# Patient Record
Sex: Male | Born: 1960 | Race: White | Hispanic: No
Health system: Western US, Academic
[De-identification: ages and names within clinical notes are randomized; demographics above are authoritative.]

## PROBLEM LIST (undated history)

## (undated) DIAGNOSIS — I10 Essential (primary) hypertension: Secondary | ICD-10-CM

## (undated) DIAGNOSIS — M47819 Spondylosis without myelopathy or radiculopathy, site unspecified: Secondary | ICD-10-CM

## (undated) DIAGNOSIS — K219 Gastro-esophageal reflux disease without esophagitis: Secondary | ICD-10-CM

## (undated) DIAGNOSIS — Z87442 Personal history of urinary calculi: Secondary | ICD-10-CM

## (undated) DIAGNOSIS — E7800 Pure hypercholesterolemia, unspecified: Secondary | ICD-10-CM

## (undated) DIAGNOSIS — E785 Hyperlipidemia, unspecified: Secondary | ICD-10-CM

## (undated) DIAGNOSIS — J309 Allergic rhinitis, unspecified: Secondary | ICD-10-CM

## (undated) HISTORY — DX: Allergic rhinitis, unspecified: J30.9

## (undated) HISTORY — DX: Hyperlipidemia, unspecified: E78.5

## (undated) HISTORY — DX: Gastro-esophageal reflux disease without esophagitis: K21.9

## (undated) HISTORY — DX: Pure hypercholesterolemia, unspecified: E78.00

## (undated) HISTORY — PX: NECK SURGERY: SHX720

## (undated) HISTORY — PX: BICEPS TENDON REPAIR: SHX566

## (undated) HISTORY — PX: CARDIAC CATHETERIZATION: SHX172

## (undated) HISTORY — PX: UPPER GASTROINTESTINAL ENDOSCOPY: SHX188

## (undated) DEATH — deceased

---

## 2005-02-04 ENCOUNTER — Emergency Department (HOSPITAL_COMMUNITY): Admission: EM | Admit: 2005-02-04 | Discharge: 2005-02-04 | Payer: Self-pay | Admitting: Emergency Medicine

## 2007-05-22 ENCOUNTER — Emergency Department (HOSPITAL_COMMUNITY): Admission: EM | Admit: 2007-05-22 | Discharge: 2007-05-22 | Payer: Self-pay | Admitting: Emergency Medicine

## 2009-11-24 ENCOUNTER — Ambulatory Visit (INDEPENDENT_AMBULATORY_CARE_PROVIDER_SITE_OTHER): Payer: BLUE CROSS/BLUE SHIELD | Admitting: Internal Medicine

## 2009-11-24 ENCOUNTER — Encounter (INDEPENDENT_AMBULATORY_CARE_PROVIDER_SITE_OTHER): Payer: Self-pay | Admitting: Internal Medicine

## 2009-11-24 VITALS — BP 146/82 | HR 76 | Temp 97.4°F | Resp 20 | Wt 217.0 lb

## 2009-11-24 LAB — THYROID STIMULATING HORMONE: Thyroid Stimulating Hormone: 2.618 u[IU]/mL (ref 0.400–5.000)

## 2009-11-24 MED ORDER — SERTRALINE HCL 50 MG OR TABS
ORAL_TABLET | ORAL | Status: DC
Start: 2009-11-24 — End: 2010-02-23

## 2009-11-24 NOTE — Progress Notes (Signed)
New pt here for medication review.

## 2009-11-24 NOTE — Progress Notes (Signed)
CHIEF COMPLAINT:  [name] is a 49 year old male here to discuss Medication management  .    HISTORY OF PRESENT ILLNESS OR INTERVAL HISTORY:  Pt with a family doctor in [place].  He will be switching to care here.  Four weeks ago, he was seen as a medication f/u for cholesterol.      He has a history of depression, high cholesterol, and hypertension for several years.  He was on fluoxetine, statin.  He has headaches from statins.  He did not tolerate niacin.  He has a history of [employer].  Four to five months ago, he developed increase sensation of heat and sweating.  He has a history of blood sugar dropping and he gets sensations of shaking and jittery feeling.  He feels disconnected and weak.  He is having particular problems in the mornings, but is starting to have them more frequently and he feels this way during the day.  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  Last week, he turned his head quickly and he had a sensation of vertigo.      He had a cramping sensation in the center of his chest last week than radiated to his back and up into his jaw.  This lasted a few minutes and went away.  He has had similar sensation while sleeping, but doesn't wake him up.  This has happened about 3-4 times.      He started taking fluoxetine when he moved back to [facility] area.  He moved to [place] in March/April.      Review of Systems   Constitutional: Positive for diaphoresis, activity change and fatigue.   Respiratory: Positive for chest tightness.    Cardiovascular: Positive for chest pain and palpitations.   Gastrointestinal: Positive for nausea.   Musculoskeletal: Positive for arthralgias.   Neurological: Positive for dizziness and headaches.   Psychiatric/Behavioral: Positive for sleep disturbance. Negative for suicidal ideas and self-injury. The patient is nervous/anxious.        PROBLEM LIST:  Reviewed and updated in the electronic medical record under Problem List.    This  includes pertinent past medical and surgical history.      MEDICATIONS:  Reviewed and updated in the electronic medical record under Medications.      ALLERGIES:  Reviewed and updated in the electronic medical record under Allergies.      SOCIAL HISTORY:  Pt with a history of working for [employer].  He had a high stress job.  He is from [place], moved there in 2006, then moved to [place] in a rental house.  He started taking fluoxetine at the time.      FAMILY HISTORY:  Reviewed and updated in the electronic medical record under Family History.    Physical Exam   Constitutional: He is oriented to person, place, and time. He appears well-developed and well-nourished. He appears distressed.   HENT:   Head: Normocephalic and atraumatic.   Neck: Normal range of motion. Neck supple. No JVD present. No tracheal deviation present. No thyromegaly present.   Cardiovascular: Normal rate, regular rhythm, normal heart sounds and intact distal pulses.  Exam reveals no gallop and no friction rub.    No murmur heard.  Pulmonary/Chest: Effort normal and breath sounds normal. No stridor. No respiratory distress. He has no wheezes. He has no rales. He exhibits no tenderness.   Abdominal: Soft. Bowel sounds are normal. He exhibits no  distension and no mass. No tenderness. He has no rebound and no guarding.   Musculoskeletal: Normal range of motion. He exhibits no edema and no tenderness.   Lymphadenopathy:     He has no cervical adenopathy.   Neurological: He is alert and oriented to person, place, and time. He has normal reflexes.   Skin: No rash noted. He is not diaphoretic. No erythema. No pallor.   Psychiatric: He has a normal mood and affect. His behavior is normal. Judgment and thought content normal.       DIAGNOSTICS:  ECG with normal rhythm, normal intervals, slight delay in R wave progression, but otherwise normal in appearance.        PHQ9 17  GAD7 14    ASSESSMENT AND PLAN:   CHEST PAIN NOS  (primary encounter  diagnosis)  Comment: ECG reassuring.  I am concerned given his mother's history of sudden death (usually cardiac).  I think I may want to start with stress test (stress echo).  If normal, then unlikely to be cardiac chest pain.  Echo would be helpful in assessing the aorta as well.  His history of pain radiating up to throat is concerning for aorta pathology.    Plan: EKG, start ASA.       OTHER MALAISE AND FATIGUE  Comment: I am concerned that his symptoms are most consistent with anxiety.  I have started sertraline and will uptitrate and have him start fluoxetine.  He will need to be on a more aggressive treatment.  Thyroid ran to r/o organic cause.    Plan: Sertraline HCl 50 MG Oral Tab, THYROID         STIMULATING HORMONE          HTN:  Needs better control, but will re-address as he seemed rather nervous.      Anxiety and depression:   See above.  Likely a good MHIP candidate.      2 weeks.

## 2009-11-29 ENCOUNTER — Encounter (INDEPENDENT_AMBULATORY_CARE_PROVIDER_SITE_OTHER): Payer: Self-pay | Admitting: Internal Medicine

## 2009-12-14 ENCOUNTER — Ambulatory Visit (INDEPENDENT_AMBULATORY_CARE_PROVIDER_SITE_OTHER): Payer: BLUE CROSS/BLUE SHIELD | Admitting: Internal Medicine

## 2009-12-14 ENCOUNTER — Encounter (INDEPENDENT_AMBULATORY_CARE_PROVIDER_SITE_OTHER): Payer: Self-pay | Admitting: Internal Medicine

## 2009-12-14 VITALS — BP 110/66 | HR 80 | Temp 97.6°F | Resp 20 | Wt 214.0 lb

## 2009-12-14 MED ORDER — VENLAFAXINE HCL ER 37.5 MG OR TB24
EXTENDED_RELEASE_TABLET | ORAL | Status: DC
Start: 2009-12-14 — End: 2010-01-11

## 2009-12-14 NOTE — Progress Notes (Signed)
CHIEF COMPLAINT:  Dustin Weaver is a 49 year old male here to discuss chest pain.    HISTORY OF PRESENT ILLNESS OR INTERVAL HISTORY:  Pt started sertraline and has been doing well.  He gets sore muscles in the legs and increased fatigue.  The episodes of dizziness, etc have decreased to almost nothing.  This happened about a week after the last visit.  He is unclear if any of these episodes were panic related.  He does not have any signs or symptoms of w/d with the change of SSRIs.  He would like to change the fact that he is fatigued with the medication.  He continues to have problem sleeping at night.      Chest pain has been periodic for years.  He did a stress treadmill years ago and had a normal exam as he had chest pain back then.  The pain is not necessarily associated with food or with activity.  He will have back pain extending into his neck.  His last ECG was normal.  His mother did have sudden death in her 31s, unclear etiology.  He has increased lipids.      Review of Systems   Constitutional: Positive for fatigue.   Cardiovascular: Positive for chest pain.   Musculoskeletal: Positive for back pain.     PROBLEM LIST:  Reviewed and updated in the electronic medical record under Problem List.    This includes pertinent past medical and surgical history.      MEDICATIONS:  Reviewed and updated in the electronic medical record under Medications.      ALLERGIES:  Reviewed and updated in the electronic medical record under Allergies.      SOCIAL HISTORY:  Reviewed and updated in the electronic medical record under Social History.    FAMILY HISTORY:  Reviewed and updated in the electronic medical record under Family History.      Physical Exam   Constitutional: He is oriented to person, place, and time. He appears well-developed and well-nourished. No distress.   HENT:   Head: Normocephalic and atraumatic.   Eyes: Conjunctivae are normal. Pupils are equal, round, and reactive to light. No scleral icterus.      Cardiovascular: Normal rate, regular rhythm and intact distal pulses.  Exam reveals no gallop and no friction rub.    No murmur heard.  Pulmonary/Chest: Effort normal and breath sounds normal. No respiratory distress. He has no wheezes. He has no rales.   Musculoskeletal: Normal range of motion.   Neurological: He is alert and oriented to person, place, and time.   Skin: Skin is warm and dry. No rash noted. He is not diaphoretic. No erythema. No pallor.   Psychiatric: His behavior is normal. Judgment and thought content normal.        Slightly anxious.  Less so this visit.       ECG reviewed and normal (done last visit)    ASSESSMENT AND PLAN:   CHEST PAIN NOS  (primary encounter diagnosis)  Pt with concerning chest pain and risk factors for cardiac disease.  I am concerned that his mother's history of sudden death can be indicative of increased risk of coronary disease.  Additionally, I am worried about the specific symptoms being related to an aortic problem.   -  I have ordered echo to evaluate the aorta as well as the structure of the heart to r/o HOCM or other causes of sudden death.    -  Treadmill stress test ordered  for evaluation of ischemia.                 GENERALIZED ANXIETY DIS  Since pt having fatigue with SSRI, I have started venlafaxine, which may also help with his chronic back pain.  He will uptitrate over the next several weeks while decreasing sertraline.  See patient instructions.       DEPRESSIVE DISORDER RECURRENT,NOS  See above.      F/u in 4 weeks.

## 2009-12-14 NOTE — Patient Instructions (Signed)
Start venlafaxine 1 tab daily, decrease sertraline to 2 tabs daily x 1 week.    Increase venlafaxine to 2 tabs daily and decrease sertraline to 1 tab daily x 1 week,  Increase venlafaxine to 3 tabs daily and stop sertraline.      Echo to evaluate the structure of the heart and aorta.      Stress test to evaluate chest pain.

## 2010-01-06 ENCOUNTER — Telehealth (INDEPENDENT_AMBULATORY_CARE_PROVIDER_SITE_OTHER): Payer: Self-pay | Admitting: Internal Medicine

## 2010-01-11 ENCOUNTER — Ambulatory Visit (INDEPENDENT_AMBULATORY_CARE_PROVIDER_SITE_OTHER): Payer: BLUE CROSS/BLUE SHIELD | Admitting: Internal Medicine

## 2010-01-11 VITALS — BP 130/82 | HR 60 | Temp 98.8°F | Resp 20 | Wt 214.0 lb

## 2010-01-11 MED ORDER — LISINOPRIL 10 MG OR TABS
ORAL_TABLET | ORAL | Status: DC
Start: 2010-01-11 — End: 2010-02-23

## 2010-01-11 MED ORDER — ZOLPIDEM TARTRATE 10 MG OR TABS
ORAL_TABLET | ORAL | Status: DC
Start: 2010-01-11 — End: 2010-02-23

## 2010-01-11 NOTE — Progress Notes (Signed)
Pt here for 4 week follow up on anxiety/depression.

## 2010-01-11 NOTE — Progress Notes (Signed)
CHIEF COMPLAINT:  Dustin Weaver is a 49 year old male here to discuss Follow-Up   .    HISTORY OF PRESENT ILLNESS OR INTERVAL HISTORY:  Pt went for heart echo.  He was told that this was normal.  He would like to hold on any further diagnostic testing at this time because he thinks it is anxiety related chest symptoms and they have resolved.      He continues to have headache, jittery feeling.  He has noted feeling poorly since starting fluoxetine.  He continues to feel tired and sleepy.  He does not sleep well at night as he is a light sleeper.  He has tried sleep hygeine and natural methods (like herbals) without help.  He does not want to feel groggy in the morning.      He did not fill the venlafaxine because of cost.  He does think the sertraline is causing fatigue.  He is currently taking it in the morning and thinks it may be causing the drowsiness.      Review of Systems   Constitutional: Positive for fatigue.   Cardiovascular: Negative.    Skin: Negative.    Neurological: Negative.    Psychiatric/Behavioral: Negative for self-injury. The patient is nervous/anxious.      PROBLEM LIST:  Reviewed and updated in the electronic medical record under Problem List.    This includes pertinent past medical and surgical history.    Patient Active Problem List   Diagnoses Date Noted   . GENERALIZED ANXIETY DIS 11/29/2009     Priority: Medium   . DEPRESSIVE DISORDER RECURRENT,NOS 11/29/2009     Priority: Medium     Suicidal thoughts in the past, started fluoxetine     . BENIGN HYPERTENSION 11/29/2009     Priority: Medium     Normal echo 01/2010     . PURE HYPERCHOLESTEROLEM 11/29/2009     Priority: Medium     Did not tolerate statin           MEDICATIONS:  Reviewed and updated in the electronic medical record under Medications.      ALLERGIES:  Reviewed and updated in the electronic medical record under Allergies.      SOCIAL HISTORY:  Reviewed and updated in the electronic medical record under Social History.    FAMILY  HISTORY:  Reviewed and updated in the electronic medical record under Family History.      Physical Exam   Constitutional: He is oriented to person, place, and time. He appears well-developed and well-nourished.   HENT:   Head: Normocephalic and atraumatic.   Eyes: Pupils are equal, round, and reactive to light.   Cardiovascular: Normal rate and regular rhythm.  Exam reveals friction rub. Exam reveals no gallop.    No murmur heard.  Pulmonary/Chest: Effort normal and breath sounds normal. No respiratory distress. He has no wheezes. He has no rales.   Neurological: He is alert and oriented to person, place, and time.   Skin: Skin is warm and dry. No rash noted. No erythema. No pallor.   Psychiatric: His behavior is normal. Judgment and thought content normal.        Pt has an axious demeanor, but is slightly improved.         ASSESSMENT AND PLAN:   GENERALIZED ANXIETY DIS  (primary encounter diagnosis)  Comment: Pt continues to have fatigue, which may be a side effect of the medication.  He would like to stick with the same medication (  sertraline) and simply change the time of day that he takes the medication.  He will start nighttime dosing as it seems to cause some fatigue.  Additionally, he will add ambien to assist with sleep.  This will likely be helpful in keeping him from feeling fatigued during the day.  Sleep apnea as also discussed, but he would like to hold on a sleep study for now.         DEPRESSIVE DISORDER RECURRENT,NOS  Comment: see above.    Plan: Zolpidem Tartrate 10 MG Oral Tab             BENIGN HYPERTENSION  Comment: controlled today.    Plan: Lisinopril 10 MG Oral Tab

## 2010-02-23 ENCOUNTER — Ambulatory Visit (INDEPENDENT_AMBULATORY_CARE_PROVIDER_SITE_OTHER): Payer: BLUE CROSS/BLUE SHIELD | Admitting: Internal Medicine

## 2010-02-23 VITALS — BP 138/85 | HR 88 | Temp 98.2°F | Resp 20 | Wt 213.0 lb

## 2010-02-23 MED ORDER — LISINOPRIL 10 MG OR TABS
ORAL_TABLET | ORAL | Status: DC
Start: 2010-02-23 — End: 2011-04-03

## 2010-02-23 MED ORDER — SERTRALINE HCL 50 MG OR TABS
ORAL_TABLET | ORAL | Status: DC
Start: 2010-02-23 — End: 2010-11-13

## 2010-02-23 MED ORDER — ZOLPIDEM TARTRATE 10 MG OR TABS
ORAL_TABLET | ORAL | Status: DC
Start: 2010-02-23 — End: 2010-03-24

## 2010-02-23 NOTE — Patient Instructions (Signed)
Follow-up Appointment with:   Azriel Dancy, MD      OVS (Office Visit Short),  3 months for cholesterol      __Sign Release of Information      __Sign up for eCare

## 2010-02-23 NOTE — Progress Notes (Signed)
Pt here for 6 week follow up on anxiety.

## 2010-02-23 NOTE — Progress Notes (Signed)
CHIEF COMPLAINT:  Mr. Dec is a 49 year old male here to discuss Anxiety  .    HISTORY OF PRESENT ILLNESS OR INTERVAL HISTORY:  Pt is feeling better.  He thinks the meds are at the right dose.  He is taking 1/2 pill of lisinopril which is helping with his symptoms.  The sertraline is helping him to feel more relaxed.  He continues to have the low blood sugar feeling, but it does not bother him as much.  He notes that it generally coincides with increased caffeine.  He wanted to know if he will need the antidepressant for life, or if he could be off it at some point.  He is not having side effects from the meds.  He did mention that his family members either drink or are on medications for depression and anxiety.  He no longer drinks.          Review of Systems   Constitutional: Negative.    HENT: Negative.    Musculoskeletal: Negative.    Psychiatric/Behavioral: Negative.        PROBLEM LIST:  Reviewed and updated in the electronic medical record under Problem List.    This includes pertinent past medical and surgical history.    Patient Active Problem List   Diagnoses Date Noted   . GENERALIZED ANXIETY DIS 11/29/2009     Priority: Medium   . DEPRESSIVE DISORDER RECURRENT,NOS 11/29/2009     Priority: Medium     Suicidal thoughts in the past, started fluoxetine     . BENIGN HYPERTENSION 11/29/2009     Priority: Medium     Normal echo 01/2010     . PURE HYPERCHOLESTEROLEM 11/29/2009     Priority: Medium     Did not tolerate statin           MEDICATIONS:  Reviewed and updated in the electronic medical record under Medications.      ALLERGIES:  Reviewed and updated in the electronic medical record under Allergies.      SOCIAL HISTORY:  Reviewed and updated in the electronic medical record under Social History.    FAMILY HISTORY:  Reviewed and updated in the electronic medical record under Family History.      Physical Exam   Constitutional: He is oriented to person, place, and time. He appears well-developed and  well-nourished. No distress.   HENT:   Head: Normocephalic and atraumatic.   Mouth/Throat: No oropharyngeal exudate.   Eyes: Conjunctivae are normal. Pupils are equal, round, and reactive to light.   Cardiovascular: Normal rate, regular rhythm and normal heart sounds.  Exam reveals no gallop and no friction rub.    No murmur heard.  Pulmonary/Chest: Effort normal and breath sounds normal. No respiratory distress. He has no wheezes. He has no rales. He exhibits no tenderness.   Neurological: He is alert and oriented to person, place, and time.   Skin: Skin is warm and dry. No rash noted. He is not diaphoretic. No erythema. No pallor.   Psychiatric: He has a normal mood and affect. His behavior is normal. Judgment and thought content normal.         PHQ9  9  GAD7  3    ASSESSMENT AND PLAN:   DEPRESSIVE DISORDER RECURRENT,NOS  (primary encounter diagnosis)  Comment: continue sertraline at current dose for at least 6 months.    Plan: Zolpidem Tartrate 10 MG Oral Tab             BENIGN  HYPERTENSION  Comment: continue lisinopril at 5mg , call if readings are abnormal >140/90  Plan: Lisinopril 10 MG Oral Tab             OTHER MALAISE AND FATIGUE  Comment: resolved.    Plan: Sertraline HCl 50 MG Oral Tab             PURE HYPERCHOLESTEROLEM  Comment: starting omega 3 fatty acids  recheck in 3 months.    Plan: Omega-3 Fatty Acids (FISH OIL) 1000 MG Oral Cap

## 2010-03-23 ENCOUNTER — Telehealth (INDEPENDENT_AMBULATORY_CARE_PROVIDER_SITE_OTHER): Payer: Self-pay | Admitting: Internal Medicine

## 2010-03-23 NOTE — Telephone Encounter (Signed)
Patient states he needs an refill on his medication patient not sure what the name of it but, patient states it's the Generic for Ambien 10 MG.  Patient states he has ONLY three days worth left.  Please advise.

## 2010-03-24 ENCOUNTER — Telehealth (INDEPENDENT_AMBULATORY_CARE_PROVIDER_SITE_OTHER): Payer: Self-pay | Admitting: Internal Medicine

## 2010-03-24 MED ORDER — ZOLPIDEM TARTRATE 10 MG OR TABS
ORAL_TABLET | ORAL | Status: DC
Start: 2010-03-24 — End: 2010-09-25

## 2010-03-24 NOTE — Telephone Encounter (Addendum)
Error open in encounter

## 2010-03-24 NOTE — Telephone Encounter (Signed)
Received fax from [facility] pharmacy on 03/23/10. Zolpidem requires prior authorization.   Called pt insurance at [phone] for approval. The prio Berkley Harvey will be faxed today.    #[phone]

## 2010-03-25 NOTE — Telephone Encounter (Signed)
Patient calling to check on the status of this. Patient states that the pharmacy is saying that [employer] has not received the prior-authorization form. Patient called in to let us know what phone number to call, however he wrote down not enough digits. [phone]. I called [employer] [place] and got the phone number for [employer]: [phone]. Patient would like this to be taken care of asap, and would like a call back regarding the status of this. Patient has given verbal ok to leave detailed voicemail if he does not answer. Thank you.

## 2010-03-25 NOTE — Telephone Encounter (Signed)
CONFIRMED PHONE NUMBER: [phone]  CALLERS FIRST AND LAST NAME: Alyssa  FACILITY NAME: [facility]  TITLE:Pharmacy rep  CALLERS RELATIONSHIP  other    RETURN CALL: OK to leave detailed message with anyone that answers    SUBJECT: Prescription Management   REASON FOR REQUEST:The caller stated that the pt's insurance company informed the pharmacy that authorization from the prescribing provider has not yet been received    MEDICATION: Zolpidem    PRESCRIBING PROVIDER: Mindi Slicker, MD    DOSE TAKING NOW:   PHARMACY NAME, LOCATION, & PHONE #:

## 2010-03-25 NOTE — Telephone Encounter (Signed)
Called the pharmacy. They have not revceived an approval yet for the Zolpidem. The pharmacy said insurance is probably still reviewing it. I will fax the prior auth again

## 2010-03-26 ENCOUNTER — Telehealth (INDEPENDENT_AMBULATORY_CARE_PROVIDER_SITE_OTHER): Payer: Self-pay | Admitting: Internal Medicine

## 2010-03-26 NOTE — Telephone Encounter (Signed)
I called the prior auth hotline.  This should be approved by today and ready for pickup tomorrow.  Please inform patient.

## 2010-03-26 NOTE — Telephone Encounter (Signed)
Called pt and informed per Dr. Deirdre Pippins msg. Closing TE

## 2010-03-30 NOTE — Telephone Encounter (Signed)
Received fax from [facility] 03/27/10. Got an approval. Closing TE

## 2010-05-26 ENCOUNTER — Ambulatory Visit (INDEPENDENT_AMBULATORY_CARE_PROVIDER_SITE_OTHER): Payer: BLUE CROSS/BLUE SHIELD | Admitting: Internal Medicine

## 2010-05-26 VITALS — BP 128/70 | HR 83 | Temp 97.2°F | Wt 209.0 lb

## 2010-05-26 NOTE — Progress Notes (Signed)
CHIEF COMPLAINT:  Dustin Weaver is a 50 year old male here to discuss Follow-Up   .    HISTORY OF PRESENT ILLNESS OR INTERVAL HISTORY:  Pt is here to discuss 3 months Follow-Up.  He would like to discuss the sleep aids.  Ambien sometimes works and sometimes does not working.  When it works, he is able to go to sleep.  He does need a sleep aid to sleep aid.  He does drink coke when he takes the pill.      He had sinus issues a couple of weeks ago, he hd issues with allergies.  Eyes were burning and his mucous membranes were swollen.  He feels like he cannot get enough air in.  He can expand his lungs.  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  He has no history of asthma.        He is not having chest pains, but does have palpitations.  Sertraline is working well for anxiety.      Review of Systems   Respiratory: Positive for shortness of breath.    Cardiovascular: Positive for palpitations.   Psychiatric/Behavioral: Positive for sleep disturbance.         PROBLEM LIST:  Reviewed and updated in the electronic medical record under Problem List.    This includes pertinent past medical and surgical history.    Patient Active Problem List   Diagnoses Date Noted   . GENERALIZED ANXIETY DIS 11/29/2009     Priority: Medium   . DEPRESSIVE DISORDER RECURRENT,NOS 11/29/2009     Priority: Medium     Suicidal thoughts in the past, started fluoxetine     . BENIGN HYPERTENSION 11/29/2009     Priority: Medium     Normal echo 01/2010     . PURE HYPERCHOLESTEROLEM 11/29/2009     Priority: Medium     Did not tolerate statin           MEDICATIONS:  Reviewed and updated in the electronic medical record under Medications.      ALLERGIES:  Reviewed and updated in the electronic medical record under Allergies.      SOCIAL HISTORY:  Reviewed and updated in the electronic medical record under Social History.    FAMILY HISTORY:  Reviewed and updated in the electronic medical record under Family  History.      Physical Exam   Constitutional: He is oriented to person, place, and time. He appears well-developed and well-nourished. No distress.   HENT:   Head: Normocephalic and atraumatic.   Eyes: Conjunctivae are normal. Pupils are equal, round, and reactive to light.   Neck: Normal range of motion. Neck supple. No JVD present. No tracheal deviation present. No thyromegaly present.   Cardiovascular: Normal rate, regular rhythm and normal heart sounds.  Exam reveals no gallop and no friction rub.    No murmur heard.  Pulmonary/Chest: Breath sounds normal. No respiratory distress. He has no wheezes. He has no rales.   Abdominal: Soft. Bowel sounds are normal.   Lymphadenopathy:     He has no cervical adenopathy.   Neurological: He is alert and oriented to person, place, and time.   Skin: Skin is warm and dry. He is not diaphoretic.   Psychiatric: He has a normal mood and affect. His behavior is normal. Judgment and thought content normal.       ASSESSMENT AND PLAN:   SHORTNESS OF BREATH  (primary  encounter diagnosis)  Spirometry in the office as normal FEV, FEV1, both >4L and 80% predicted.  FEV/FEV1 ratio is normal.  No evidence of obstruction.  He may be having reactive airways from poor air quality with his new work position.       SCREENING FOR LIPOID DISORDERS  Comment: needs screening today  Plan: LIPID PANEL, COMPREHENSIVE METABOLIC PANEL             GENERALIZED ANXIETY DIS  Comment: Pt doing well and should continue sertraline for an additional 6 months.    Plan: CBC, DIFF             VACCINE FOR INFLUENZA  Plan: INFLUENZA VACC, NO PRESERV 3 YRS AND OLDER         0.5ML/IM             HEARING LOSS NOS  Comment: bilateral cerumen impaction, removed today  Plan: EAR WAX LAVAGE ONLY            INSOMNIA:  Avoid caffeine.

## 2010-05-26 NOTE — Patient Instructions (Signed)
Follow-up Appointment with:   Catha Gosselin, MD      OVS (Office Visit Short),  6 months for hypertension      __Sign Release of Information      __Sign up for eCare

## 2010-06-16 ENCOUNTER — Ambulatory Visit (INDEPENDENT_AMBULATORY_CARE_PROVIDER_SITE_OTHER): Payer: BLUE CROSS/BLUE SHIELD | Admitting: Family Medicine

## 2010-06-17 ENCOUNTER — Telehealth (INDEPENDENT_AMBULATORY_CARE_PROVIDER_SITE_OTHER): Payer: Self-pay

## 2010-06-17 ENCOUNTER — Encounter (INDEPENDENT_AMBULATORY_CARE_PROVIDER_SITE_OTHER): Payer: Self-pay

## 2010-06-17 NOTE — Telephone Encounter (Signed)
No appointment.  See [facility] Discharge Summary Scan.

## 2010-06-18 NOTE — Telephone Encounter (Signed)
LM for patient to CB. Patient was seen in [facility] and please find out if he needs a FU appointment.

## 2010-06-21 NOTE — Telephone Encounter (Signed)
LM for patient that calling to FU with patient. Advised patient to call or schedule an appointment with [place] if he needs a FU appointment. Closing TE.

## 2010-06-22 ENCOUNTER — Encounter (INDEPENDENT_AMBULATORY_CARE_PROVIDER_SITE_OTHER): Payer: Self-pay

## 2010-09-25 ENCOUNTER — Other Ambulatory Visit (INDEPENDENT_AMBULATORY_CARE_PROVIDER_SITE_OTHER): Payer: Self-pay | Admitting: Internal Medicine

## 2010-09-27 MED ORDER — ZOLPIDEM TARTRATE 10 MG OR TABS
ORAL_TABLET | ORAL | Status: DC
Start: 2010-09-25 — End: 2010-11-13

## 2010-09-27 NOTE — Telephone Encounter (Signed)
Medication Refill Documentation    Name of Medication: Zolpidem Tartrate    Prescribing provider:  Catha Gosselin, MD    Protocol used (by medication type, not necessarily patient diagnosis): N/A    Date last filled: 03/24/10    Date last seen for this issue: LOV 05/26/10      BP Readings from Last 2 Encounters:   05/26/10 128/70   02/23/10 138/85       Next scheduled appointment date:  Visit date not found

## 2010-10-18 ENCOUNTER — Telehealth (INDEPENDENT_AMBULATORY_CARE_PROVIDER_SITE_OTHER): Payer: Self-pay | Admitting: Internal Medicine

## 2010-10-18 NOTE — Telephone Encounter (Signed)
Called [facility] and gave refill information and advised pt.

## 2010-10-18 NOTE — Telephone Encounter (Signed)
Patient walked into the clinic concerned of rx Medication Refill Documentation   Name of Medication: Zolpidem Tartrate   Prescribing provider: Catha Gosselin, MD   Protocol used (by medication type, not necessarily patient diagnosis): N/A   Date last filled: 03/24/10   Date last seen for this issue: LOV 05/26/10.    Patient wondering if we need to have a prior authorization from insurance company first before he can have the refill.  Called [facility] and they do not have the rx.    Please call cellphone if able. Patient does not pick up, please leave messagte.    Please advise.

## 2010-11-13 ENCOUNTER — Ambulatory Visit (INDEPENDENT_AMBULATORY_CARE_PROVIDER_SITE_OTHER): Payer: BLUE CROSS/BLUE SHIELD | Admitting: Internal Medicine

## 2010-11-13 VITALS — BP 119/80 | HR 85 | Temp 98.0°F | Wt 213.0 lb

## 2010-11-13 MED ORDER — ZOLPIDEM TARTRATE 10 MG OR TABS
ORAL_TABLET | ORAL | Status: DC
Start: 2010-11-13 — End: 2011-06-11

## 2010-11-13 MED ORDER — LOVASTATIN 10 MG OR TABS
ORAL_TABLET | ORAL | Status: DC
Start: 2010-11-13 — End: 2011-03-05

## 2010-11-13 NOTE — Patient Instructions (Signed)
Follow-up Appointment with:   Arthuro Canelo, MD      OVS (Office Visit Short),  3 months for follow-up.        __Sign Release of Information      __Sign up for eCare

## 2010-11-13 NOTE — Progress Notes (Signed)
CHIEF COMPLAINT:  Dustin Weaver is a 50 year old male here to discuss Follow-Up   .    HISTORY OF PRESENT ILLNESS OR INTERVAL HISTORY:  Pt is here to discuss 6 month Follow-Up. Zolpidem is helpful to prevent him from grinding his teeth.  No sleep walking.  He has difficulty getting the medication dispensed because of issues with [facility].  Otherwise, he is doing well on less sertraline, would like to further decrease.  He has stopped taking fish oil due to cost and would like to reconsider statins.  They have always caused headaches in the past, but he thinks he may be able to tolerate a low dose, like he did with lisinopril.        Review of Systems   Constitutional: Negative.    Respiratory: Negative.    Musculoskeletal: Negative.    Psychiatric/Behavioral: Negative.          PROBLEM LIST:  Reviewed and updated in the electronic medical record under Problem List.    This includes pertinent past medical and surgical history.    Patient Active Problem List   Diagnoses Date Noted   . GENERALIZED ANXIETY DIS [300.02] 11/29/2009   . Major depressive disorder, recurrent episode, unspecified [296.30] 11/29/2009     Suicidal thoughts in the past, did not do well with fluoxetine.  Has tolerated sertraline well.       Marland Kitchen BENIGN HYPERTENSION [401.1] 11/29/2009     Normal echo 01/2010     . PURE HYPERCHOLESTEROLEM [272.0] 11/29/2009     Did not tolerate statin           MEDICATIONS:  Reviewed and updated in the electronic medical record under Medications.      ALLERGIES:  Reviewed and updated in the electronic medical record under Allergies.      SOCIAL HISTORY:  Reviewed and updated in the electronic medical record under Social History.    FAMILY HISTORY:  Reviewed and updated in the electronic medical record under Family History.        Physical Exam   Constitutional: He is oriented to person, place, and time. He appears well-developed and well-nourished. No distress.   Cardiovascular: Normal rate, regular rhythm and normal heart  sounds.  Exam reveals no gallop and no friction rub.    No murmur heard.  Pulmonary/Chest: Effort normal and breath sounds normal.   Neurological: He is alert and oriented to person, place, and time.   Skin: He is not diaphoretic.   Psychiatric: He has a normal mood and affect. His behavior is normal. Judgment and thought content normal.       ASSESSMENT AND PLAN:  272.0 Pure hypercholesterolemia  (primary encounter diagnosis)  Started low potency medication at 1/2 dose, increase as tolerated.  Discussed diet modifications    300.00 Anxiety state, unspecified  Comment: ok to decrease sertraline again to 50mg  given improvement in symptoms      780.52 Insomnia, unspecified  Comment: OK to continue zolpidem for now.  His teeth grinding is improved, so beneficial.

## 2011-02-23 ENCOUNTER — Telehealth (INDEPENDENT_AMBULATORY_CARE_PROVIDER_SITE_OTHER): Payer: Self-pay | Admitting: Internal Medicine

## 2011-02-23 NOTE — Telephone Encounter (Signed)
[facility] send a fax stating that Zolpidem Tartrate has been approved from 02/02/11-02/23/12

## 2011-03-05 ENCOUNTER — Ambulatory Visit (INDEPENDENT_AMBULATORY_CARE_PROVIDER_SITE_OTHER): Payer: BLUE CROSS/BLUE SHIELD | Admitting: Internal Medicine

## 2011-03-05 ENCOUNTER — Encounter (INDEPENDENT_AMBULATORY_CARE_PROVIDER_SITE_OTHER): Payer: Self-pay | Admitting: Internal Medicine

## 2011-03-05 VITALS — BP 118/76 | HR 78 | Temp 97.9°F | Resp 18 | Wt 215.0 lb

## 2011-03-05 LAB — COMPREHENSIVE METABOLIC PANEL
ALT (GPT): 37 U/L (ref 10–64)
AST (GOT): 26 U/L (ref 15–40)
Albumin: 4.3 g/dL (ref 3.5–5.2)
Alkaline Phosphatase (Total): 77 U/L (ref 39–139)
Anion Gap: 5 (ref 3–11)
Bilirubin (Total): 0.7 mg/dL (ref 0.2–1.3)
Calcium: 9.5 mg/dL (ref 8.9–10.2)
Carbon Dioxide, Total: 23 mEq/L (ref 22–32)
Chloride: 109 mEq/L — ABNORMAL HIGH (ref 98–108)
Creatinine: 1.1 mg/dL (ref 0.51–1.18)
GFR, Calc, African American: >60 mL/min (ref >59)
GFR, Calc, European American: >60 mL/min (ref >59)
Glucose: 94 mg/dL (ref 62–125)
Potassium: 4.7 mEq/L (ref 3.7–5.2)
Protein (Total): 6.4 g/dL (ref 6.0–8.2)
Sodium: 137 mEq/L (ref 136–145)
Urea Nitrogen: 18 mg/dL (ref 8–21)

## 2011-03-05 LAB — LIPID PANEL
Cholesterol (LDL): 181 mg/dL — ABNORMAL HIGH (ref <130)
Cholesterol/HDL Ratio: 8.3
HDL Cholesterol: 32 mg/dL — ABNORMAL LOW (ref >40)
Non-HDL Cholesterol: 235 mg/dL — ABNORMAL HIGH (ref 0–159)
Total Cholesterol: 267 mg/dL — ABNORMAL HIGH (ref <200)
Triglyceride: 269 mg/dL — ABNORMAL HIGH (ref <150)

## 2011-03-05 LAB — CBC, DIFF
% Basophils: 0 % (ref 0–1)
% Eosinophils: 3 % (ref 0–7)
% Immature Granulocytes: 1 % (ref 0–1)
% Lymphocytes: 17 % — ABNORMAL LOW (ref 19–53)
% Monocytes: 9 % (ref 5–13)
% Neutrophils: 70 % (ref 34–71)
Absolute Eosinophil Count: 0.2 10*3/uL (ref 0.00–0.50)
Absolute Lymphocyte Count: 1.1 10*3/uL (ref 1.00–4.80)
Basophils: 0.03 10*3/uL (ref 0.00–0.20)
Hematocrit: 45 % (ref 38–50)
Hemoglobin: 16.4 g/dL (ref 13.0–18.0)
Immature Granulocytes: 0.04 10*3/uL (ref 0.00–0.05)
MCH: 31.8 pg (ref 27.3–33.6)
MCHC: 36.1 g/dL (ref 32.2–36.5)
MCV: 88 fL (ref 81–98)
Monocytes: 0.62 10*3/uL (ref 0.00–0.80)
Neutrophils: 4.69 10*3/uL (ref 1.80–7.00)
Platelet Count: 197 10*3/uL (ref 150–400)
RBC: 5.16 mil/uL (ref 4.40–5.60)
RDW-CV: 12 % (ref 11.6–14.4)
WBC: 6.68 10*3/uL (ref 4.3–10.0)

## 2011-03-05 MED ORDER — FLUTICASONE PROPIONATE 50 MCG/ACT NA SUSP
NASAL | Status: DC
Start: 2011-03-05 — End: 2011-08-13

## 2011-03-05 MED ORDER — SERTRALINE HCL 100 MG OR TABS
ORAL_TABLET | ORAL | Status: DC
Start: 2011-03-05 — End: 2011-11-12

## 2011-03-05 NOTE — Progress Notes (Signed)
Addended by: Marella Bile on: 03/05/2011 01:47 PM     Modules accepted: Orders

## 2011-03-05 NOTE — Progress Notes (Signed)
Pt here today for f/u with medications.     Flu Screening Questions:    1) Currently have a moderate or severe illness, including fever? NO    2) Ever had a serious allergic reaction to eggs?  NO    3) Ever had a serious reaction to a flu vaccine or another vaccine in the past? NO    4) Ever had Guillain-Barre syndrome associated with a vaccine? NO    5) Less than 6 months old? NO    If YES to any of the questions above - NO Flu Vaccine to be given.  Patient may consult provider as needed.    If NO to all questions above - Patient may receive Flu Shot (IM)    If between 6 months and 65 years of age, was flu vaccine received last year?  N/A    If NO to above question - Needs 2 doses of flu vaccine this year (Future order flu vaccine for at least 4 wks from today).    Flu Mist (Nasal) may be considered if patient is >2 yrs old and < 77 yrs old.    Do you wish to proceed with screening questions for Flu Mist?  No      Patient/Parent has reviewed the appropriate VIS and has all questions answered? YES    Vaccine given today without initial adverse effect. YES    Gowen, Dyan MA

## 2011-03-05 NOTE — Progress Notes (Signed)
CHIEF COMPLAINT:  Dustin Weaver is a 50 year old male here to discuss Medication management  .    HISTORY OF PRESENT ILLNESS OR INTERVAL HISTORY:  Tried cholesterol medication for 2 weeks and developed by headaches, tried to decrease red meat intake.  He still eats fried food.  He has been baking tater tots at home.  Eats fried potato chips and french fries.    Decreased red meat to 2-3 days per week.  He is working on once a week.  He eats lots of fish, prefers fried.        He has trouble breathing at night, thinks it sinus issues because he cannot breath out his nose.        L medial knee pain, particularly in a certain position.      Anxiety controlled on sertraline 100mg , did not do well with titration down.    Walks dogs 30-45 minutes daily.      Concerned about hernia in the abdomen, had skin lesions to look at.        Review of Systems   Constitutional: Negative.    Respiratory: Negative for shortness of breath.    Cardiovascular: Negative.    Gastrointestinal: Positive for abdominal pain (from hernia).   Musculoskeletal: Positive for arthralgias (knee pain).   Psychiatric/Behavioral: Negative.          PROBLEM LIST:  Reviewed and updated in the electronic medical record under Problem List.    This includes pertinent past medical and surgical history.    Patient Active Problem List   Diagnoses Date Noted   . GENERALIZED ANXIETY DIS [300.02] 11/29/2009   . Major depressive disorder, recurrent episode, unspecified [296.30] 11/29/2009     Suicidal thoughts in the past, did not do well with fluoxetine.  Has tolerated sertraline well.       Marland Kitchen BENIGN HYPERTENSION [401.1] 11/29/2009     Normal echo 01/2010     . PURE HYPERCHOLESTEROLEM [272.0] 11/29/2009     Did not tolerate statin           MEDICATIONS:  Reviewed and updated in the electronic medical record under Medications.      ALLERGIES:  Reviewed and updated in the electronic medical record under Allergies.      SOCIAL HISTORY:  Reviewed and updated in the  electronic medical record under Social History.    FAMILY HISTORY:  Reviewed and updated in the electronic medical record under Family History.        Physical Exam   Constitutional: He is oriented to person, place, and time. He appears well-developed and well-nourished. No distress.   Cardiovascular: Normal rate.    Pulmonary/Chest: Effort normal.   Abdominal: Soft. There is no tenderness.        Rectus diastasis noted     Neurological: He is alert and oriented to person, place, and time.   Skin: Skin is warm and dry. He is not diaphoretic.        Warts on the L arm, SK on chest   Psychiatric: He has a normal mood and affect. His behavior is normal. Judgment and thought content normal.       ASSESSMENT AND PLAN:  High Cholesterol  Checking today  Discussed diet recommended    300.02 Generalized anxiety disorder  Doing well on 100mg  sertraline, can decrease as needed.      V04.81 Need for prophylactic vaccination and inoculation against influenza  Flu shot given.      Rectus  diastasis:  Discussed weight loss    Knee pain:  Exercise and strength training would be helpful, likely arthritis vs. Meniscus injury.    Continue daily exercise.    Sinus congestion:  Will try flonase, but pt may be describing issues with sleep apnea, so consider sleep study over time if not improved.

## 2011-04-03 ENCOUNTER — Other Ambulatory Visit (INDEPENDENT_AMBULATORY_CARE_PROVIDER_SITE_OTHER): Payer: Self-pay | Admitting: Internal Medicine

## 2011-04-05 MED ORDER — LISINOPRIL 10 MG OR TABS
ORAL_TABLET | ORAL | Status: DC
Start: 2011-04-03 — End: 2012-04-23

## 2011-05-24 ENCOUNTER — Telehealth (INDEPENDENT_AMBULATORY_CARE_PROVIDER_SITE_OTHER): Payer: Self-pay | Admitting: Internal Medicine

## 2011-05-24 NOTE — Telephone Encounter (Signed)
Patient walked in to inform Dr. Carmina Miller that the Cyclobenzaprine 10mg  & the Methylprednisolone Tablets 4mg  dont counteract or mix well with the other current medications he is taking.  I believe it was the pharmacy who informed him of this. Could we please give him a call regarding this ASAP. Patient has already filled the RX but doesn't plan to take anything until tonight. Thank you.

## 2011-05-24 NOTE — Telephone Encounter (Signed)
Pt saw the work doctor and was given those prescriptions and he will hold off on the Sertraline and the Zolpidem while taking the below medications. Wanted Dr Carmina Miller to know.

## 2011-05-25 NOTE — Telephone Encounter (Signed)
Left detailed message stating note from pcp below. Closing TE

## 2011-05-25 NOTE — Telephone Encounter (Signed)
It is okay to take these meds with sertraline and zolpidem.

## 2011-06-11 ENCOUNTER — Other Ambulatory Visit (INDEPENDENT_AMBULATORY_CARE_PROVIDER_SITE_OTHER): Payer: Self-pay | Admitting: Internal Medicine

## 2011-06-13 MED ORDER — ZOLPIDEM TARTRATE 10 MG OR TABS
ORAL_TABLET | ORAL | Status: DC
Start: 2011-06-11 — End: 2011-11-12

## 2011-06-13 NOTE — Telephone Encounter (Signed)
Your patient has requested:  Drug: Zolpidem 10mg   Directions: 1 QHS prn  Qty:30  Last filled: 05/08/11  Pharmacy: [facility]     If the prescription is approved please make sure the signed hard copy is faxed to the requesting pharmacy or called in.   If it is not approved please let the patient & requesting pharmacy know.

## 2011-06-13 NOTE — Telephone Encounter (Signed)
Rx faxed

## 2011-08-13 ENCOUNTER — Ambulatory Visit (INDEPENDENT_AMBULATORY_CARE_PROVIDER_SITE_OTHER): Payer: BLUE CROSS/BLUE SHIELD | Admitting: Family Practice

## 2011-08-13 ENCOUNTER — Encounter (INDEPENDENT_AMBULATORY_CARE_PROVIDER_SITE_OTHER): Payer: Self-pay | Admitting: Family Practice

## 2011-08-13 VITALS — BP 130/86 | HR 81 | Temp 97.8°F | Resp 12 | Wt 218.0 lb

## 2011-08-13 MED ORDER — FLUTICASONE PROPIONATE 50 MCG/ACT NA SUSP
NASAL | Status: DC
Start: 2011-08-13 — End: 2011-11-12

## 2011-08-13 MED ORDER — OMEPRAZOLE 20 MG OR TBEC
DELAYED_RELEASE_TABLET | ORAL | Status: DC
Start: 2011-08-13 — End: 2011-09-18

## 2011-08-13 NOTE — Patient Instructions (Addendum)
Stop the Afrin/store brand stuff in nose.    OK to take Claritin daily-- allergies    Diphenhydramine (benadryl) 25mg  at night     Fluticasone (flonase) nose spray for symptom relief.  Take two puffs, twice daily until improved, then, one puff     Humidifier at night is helpful.      Return in 2-3 months if symptoms still persist

## 2011-08-13 NOTE — Progress Notes (Signed)
CC/HPI: Dustin Weaver is a 51 year old male who comes today with the following concerns:  Would like to discuss allergies and sleep apnea.   Denies that he snores. But does state that he wakes up in middle of night because he can not breath out his nose.   Thinks something is going on with sinus vs sleep apnea  Nasal congestion seems to be worsening  Onset: two weeks  Characteristics: feels like back of sinus and nasal passages are swollen, runny nose most of day  Aggravating/allieving: Afrin used a few times, helped a lot    Also needs refill on omeprazole. Doing well. n change in reflux     Patient Active Problem List   Diagnosis   . GENERALIZED ANXIETY DIS   . Major depressive disorder, recurrent episode, unspecified   . BENIGN HYPERTENSION   . PURE HYPERCHOLESTEROLEM       BP 130/86  Pulse 81  Temp 97.8 F (36.6 C) (Temporal)  Resp 12  Wt 218 lb (98.884 kg)    Review of Systems   HENT: Positive for congestion and rhinorrhea. Negative for sore throat, postnasal drip and sinus pressure.      Physical Exam   Vitals reviewed.  Constitutional: He appears well-developed.   HENT:   Right Ear: Tympanic membrane normal.   Left Ear: Tympanic membrane normal.   Nose: Mucosal edema present. No sinus tenderness. Right sinus exhibits no maxillary sinus tenderness and no frontal sinus tenderness. Left sinus exhibits no maxillary sinus tenderness and no frontal sinus tenderness.   Mouth/Throat: Uvula is midline. No oropharyngeal exudate, posterior oropharyngeal edema or posterior oropharyngeal erythema.   Neck:        Thick short neck    Cardiovascular: Normal rate and regular rhythm.    Pulmonary/Chest: Effort normal and breath sounds normal. No respiratory distress. He has no decreased breath sounds. He has no wheezes.     A/P  477.9 Allergic rhinitis  (primary encounter diagnosis)  Comment: new  Plan: see pt ed  Stop afrin: Fluticasone Propionate 50 MCG/ACT Nasal   FU in 2-3 months if no relief  Could refer to  allergist, sinus CT. Sleep study in future. Patient declines at this time    530.81 Acid reflux  Comment: controlled  Plan: Omeprazole 20 MG Oral Tab EC      refilled    Follow up if s/s change or worsen.    Patient agrees with plan of care.  Discussed with the patient and all questioned fully answered. Dustin Weaver will call me if any problems arise.     See patient education.

## 2011-09-18 ENCOUNTER — Other Ambulatory Visit (INDEPENDENT_AMBULATORY_CARE_PROVIDER_SITE_OTHER): Payer: Self-pay | Admitting: Family Practice

## 2011-09-20 MED ORDER — OMEPRAZOLE 20 MG OR CPDR
DELAYED_RELEASE_CAPSULE | ORAL | Status: DC
Start: 2011-09-18 — End: 2012-06-02

## 2011-11-12 ENCOUNTER — Ambulatory Visit (INDEPENDENT_AMBULATORY_CARE_PROVIDER_SITE_OTHER): Payer: BLUE CROSS/BLUE SHIELD | Admitting: Internal Medicine

## 2011-11-12 VITALS — BP 134/89 | HR 83 | Temp 98.2°F | Wt 223.0 lb

## 2011-11-12 LAB — COMPREHENSIVE METABOLIC PANEL
ALT (GPT): 39 U/L (ref 10–64)
AST (GOT): 26 U/L (ref 15–40)
Albumin: 4.3 g/dL (ref 3.5–5.2)
Alkaline Phosphatase (Total): 60 U/L (ref 39–139)
Anion Gap: 7 (ref 3–11)
Bilirubin (Total): 0.9 mg/dL (ref 0.2–1.3)
Calcium: 9.3 mg/dL (ref 8.9–10.2)
Carbon Dioxide, Total: 24 mEq/L (ref 22–32)
Chloride: 107 mEq/L (ref 98–108)
Creatinine: 1.21 mg/dL — ABNORMAL HIGH (ref 0.51–1.18)
GFR, Calc, African American: >60 mL/min (ref >59)
GFR, Calc, European American: >60 mL/min (ref >59)
Glucose: 100 mg/dL (ref 62–125)
Potassium: 4.7 mEq/L (ref 3.7–5.2)
Protein (Total): 6.3 g/dL (ref 6.0–8.2)
Sodium: 138 mEq/L (ref 136–145)
Urea Nitrogen: 12 mg/dL (ref 8–21)

## 2011-11-12 LAB — CBC, DIFF
% Basophils: 1 %
% Eosinophils: 3 %
% Immature Granulocytes: 1 %
% Lymphocytes: 20 %
% Monocytes: 7 %
% Neutrophils: 68 %
Absolute Eosinophil Count: 0.17 10*3/uL (ref 0.00–0.50)
Absolute Lymphocyte Count: 1.19 10*3/uL (ref 1.00–4.80)
Basophils: 0.03 10*3/uL (ref 0.00–0.20)
Hematocrit: 47 % (ref 38–50)
Hemoglobin: 16.6 g/dL (ref 13.0–18.0)
Immature Granulocytes: 0.05 10*3/uL (ref 0.00–0.05)
MCH: 30.8 pg (ref 27.3–33.6)
MCHC: 35.2 g/dL (ref 32.2–36.5)
MCV: 88 fL (ref 81–98)
Monocytes: 0.41 10*3/uL (ref 0.00–0.80)
Neutrophils: 4.02 10*3/uL (ref 1.80–7.00)
Platelet Count: 241 10*3/uL (ref 150–400)
RBC: 5.39 mil/uL (ref 4.40–5.60)
RDW-CV: 12.1 % (ref 11.6–14.4)
WBC: 5.87 10*3/uL (ref 4.3–10.0)

## 2011-11-12 LAB — LIPID PANEL
Cholesterol (LDL): 200 mg/dL — ABNORMAL HIGH (ref <130)
Cholesterol/HDL Ratio: 7.6
HDL Cholesterol: 36 mg/dL — ABNORMAL LOW (ref >40)
Non-HDL Cholesterol: 237 mg/dL — ABNORMAL HIGH (ref 0–159)
Total Cholesterol: 273 mg/dL — ABNORMAL HIGH (ref <200)
Triglyceride: 183 mg/dL — ABNORMAL HIGH (ref <150)

## 2011-11-12 NOTE — Patient Instructions (Signed)
Weight loss:  Recommended he exercise in the morning (has a treadmill, suggested 3-4 mph at 5-10% incline).  Discussed complex carbs with protein and fat at meals, keep breakfast at 300-400 calories, 400-600 calories at lunch, continue light dinners.  Continue lighter dinners and watch for extra snacks at night.  This will help knees, acid reflux and breathing.    Encouraged to look into glycemic index.      Breathing issues:  Likely from body habitus, but if not improved by next visit, will do further testing.

## 2011-11-12 NOTE — Progress Notes (Signed)
Pt is here to discuss Follow-Up.

## 2011-11-12 NOTE — Progress Notes (Signed)
CHIEF COMPLAINT:  Dustin Weaver is a 51 year old male here to discuss Follow-Up   .    HISTORY OF PRESENT ILLNESS OR INTERVAL HISTORY:      Has been off sertraline.  He feels irritable, but is not anxious or depressed.  Irritated at work.  He is only on the blood pressure.  Reflux is getting worse.  He is having knee pain, some shortness of breath.  Only exercises at work, which is very physical.  Starts at 10am, works until about 8pm.  He eats late.  Occasionally eats breakfast, eats lunch around 2PM and dinner before bed.  Goes to bed around midnight.      flonase did not help, but he is still sleeping better, continues to feel congestion from time to time.            Review of Systems   Constitutional: Negative.    HENT: Positive for congestion (improved).    Musculoskeletal: Negative.    Psychiatric/Behavioral: Negative.        PROBLEM LIST:  Reviewed and updated in the electronic medical record under Problem List.    This includes pertinent past medical and surgical history.    Patient Active Problem List    Diagnosis Date Noted   . GERD (gastroesophageal reflux disease) [530.81] 11/12/2011   . GENERALIZED ANXIETY DIS [300.02] 11/29/2009   . Major depressive disorder, recurrent episode, unspecified [296.30] 11/29/2009     Suicidal thoughts in the past, did not do well with fluoxetine.  Has tolerated sertraline well.       Marland Kitchen BENIGN HYPERTENSION [401.1] 11/29/2009     Normal echo 01/2010     . PURE HYPERCHOLESTEROLEM [272.0] 11/29/2009     Did not tolerate statin           MEDICATIONS:  Reviewed and updated in the electronic medical record under Medications.      ALLERGIES:  Reviewed and updated in the electronic medical record under Allergies.      SOCIAL HISTORY:  Reviewed and updated in the electronic medical record under Social History.    FAMILY HISTORY:  Reviewed and updated in the electronic medical record under Family History.        Physical Exam   Constitutional: He is oriented to person, place, and time.  He appears well-developed and well-nourished. No distress.   Cardiovascular: Normal rate, regular rhythm and normal heart sounds.  Exam reveals no gallop and no friction rub.    No murmur heard.  Pulmonary/Chest: Effort normal and breath sounds normal. No respiratory distress. He has no wheezes. He has no rales.   Neurological: He is alert and oriented to person, place, and time.   Skin: He is not diaphoretic.   Psychiatric: He has a normal mood and affect. His behavior is normal. Judgment and thought content normal.     ASSESSMENT AND PLAN:    Elevated cholesterol:  Labs ordered    Weight gain:  Recommended he exercise in the morning (has a treadmill, suggested 3-4 mph at 5-10% incline).  Discussed complex carbs with protein and fat at meals, keep breakfast at 300-400 calories, 400-600 calories at lunch, continue light dinners.  Continue lighter dinners and watch for extra snacks at night.  This will help knees, acid reflux and breathing.    Encouraged to look into glycemic index.      Breathing issues:  Likely from body habitus, but if not improved by next visit, will do further testing.  GERD:  Discussed diet, failed PPI taper before    Anxiety:  Discussed irritability as a symptom with ability to restart sertraline prn.

## 2012-03-07 ENCOUNTER — Ambulatory Visit (INDEPENDENT_AMBULATORY_CARE_PROVIDER_SITE_OTHER): Payer: BLUE CROSS/BLUE SHIELD

## 2012-03-07 NOTE — Progress Notes (Signed)
Vaccine Screening Questions      1.  Have you had a serious reaction or an allergic reaction to a vaccine?  NO    2.  Currently have a moderate or severe illness, including fever? (Don't Ask if vaccine   ordered by provider same day)  NO    3.  Ever had a seizure or a brain or other nervous system problem syndrome associated with a vaccine? (DTaP/TDaP/DTP pertinent) NO    4.  Is patient receiving  any live vaccinations today? (Varicella-Chickenpox, MMR-Measles/Mumps/Rubella, Zoster-Shingles)  NO    If YES to any of the questions above - Do NOT give vaccine.  Consult with RN or provider in clinic.  (#4 can be YES if all Live vaccine questions are answered NO)    If NO to all questions above - Patient may receive vaccine.    5.  Do you need to receive the Flu vaccine today? YES - Additional Flu Questions  Flu Vaccine Screening Questions:    Ever had a serious allergic reaction to eggs?  NO    Ever had Guillain-Barre syndrome associated with a vaccine? NO    Less than 6 months old? NO    If YES to any of the Flu questions above - NO Flu Vaccine to be given.  Patient may consult provider as needed.    If NO to all questions above - Patient may receive Flu Shot (IM)    If between 6 months and 40 years of age, was flu vaccine received last year?  N/A  If NO to above question:   For the 2012-13 season, administer 2 doses (separated by at least 4 weeks) to children who are receiving influenza vaccine for the first time.    For the 2013-14 season, follow dosing guidelines in the 2013 ACIP influenza vaccine recommendations     Flu Mist (Nasal) may be considered if patient is >2 yrs old and < 52 yrs old.    Do you wish to proceed with screening questions for Flu Mist?  NO     Vaccine information sheet(s) discussed, patient/parent/guardian verbalized understanding? YES     VIS given 03/07/2012 by Knox Saliva MA.      Vaccine given today without initial adverse effect. YES    Knox Saliva  Medical Assistant   [facility]  [place] [facility]

## 2012-03-07 NOTE — Patient Instructions (Signed)
gl

## 2012-04-23 ENCOUNTER — Other Ambulatory Visit (INDEPENDENT_AMBULATORY_CARE_PROVIDER_SITE_OTHER): Payer: Self-pay | Admitting: Internal Medicine

## 2012-04-26 MED ORDER — LISINOPRIL 10 MG OR TABS
ORAL_TABLET | ORAL | Status: DC
Start: 2012-04-23 — End: 2012-06-02

## 2012-06-02 ENCOUNTER — Ambulatory Visit (INDEPENDENT_AMBULATORY_CARE_PROVIDER_SITE_OTHER): Payer: BLUE CROSS/BLUE SHIELD | Admitting: Internal Medicine

## 2012-06-02 ENCOUNTER — Encounter (INDEPENDENT_AMBULATORY_CARE_PROVIDER_SITE_OTHER): Payer: Self-pay | Admitting: Internal Medicine

## 2012-06-02 VITALS — BP 131/78 | HR 80 | Temp 98.0°F | Resp 16 | Wt 227.0 lb

## 2012-06-02 LAB — BASIC METABOLIC PANEL
Anion Gap: 9 (ref 3–11)
Calcium: 9.4 mg/dL (ref 8.9–10.2)
Carbon Dioxide, Total: 26 mEq/L (ref 22–32)
Chloride: 104 mEq/L (ref 98–108)
Creatinine: 1.11 mg/dL (ref 0.51–1.18)
GFR, Calc, African American: >60 mL/min (ref >59)
GFR, Calc, European American: >60 mL/min (ref >59)
Glucose: 93 mg/dL (ref 62–125)
Potassium: 4.8 mEq/L (ref 3.7–5.2)
Sodium: 139 mEq/L (ref 136–145)
Urea Nitrogen: 14 mg/dL (ref 8–21)

## 2012-06-02 MED ORDER — LISINOPRIL 10 MG OR TABS
ORAL_TABLET | ORAL | Status: DC
Start: 2012-06-02 — End: 2013-01-19

## 2012-06-02 MED ORDER — OMEPRAZOLE 20 MG OR CPDR
DELAYED_RELEASE_CAPSULE | ORAL | Status: DC
Start: 2012-06-02 — End: 2013-01-19

## 2012-06-02 NOTE — Progress Notes (Signed)
Follow up blood presser and test results.

## 2012-06-02 NOTE — Progress Notes (Signed)
CHIEF COMPLAINT:  Dustin Weaver is a 52 year old male here to discuss Follow-Up   .    HISTORY OF PRESENT ILLNESS OR INTERVAL HISTORY:  Pt is trying to eat better.  He has noted sleep issues with sinus congestion and some have told him it is sleep apnea, but he is not sure.  Sleeping on back makes it difficult to breath out of his nose, but not breath out.  He can breath in and out of the mouth.  He has also noted some reflux at night.    He will take occasional acid reflux medication, knows some foods are worse then others.    Lifestyle and eating practices:  Decreased eating at night after work.    Starts at 10.  Sometimes eats a biscuit at [employer].  1-2, eats [employer], usually chicken with rice (eats that over [employer] or [employer]).  He has cut back on pizza on Fridays, not every Friday.  He and his wife usually do not eat together.    Feels like he is controlling the amount.    Decreased amount of chips he is eating.     Decreased overall fried food and decreased soda intake.    He drinks juice sweet tea.  (24-32 oz of sweet beverages per day)    Uses mayo, but not lots of mayo.        Review of Systems   Constitutional: Positive for unexpected weight change.   HENT: Positive for congestion.    Musculoskeletal: Negative.    Psychiatric/Behavioral: Negative.        PROBLEM LIST:  Reviewed and updated in the electronic medical record under Problem List.    This includes pertinent past medical and surgical history.    Patient Active Problem List    Diagnosis Date Noted   . GERD (gastroesophageal reflux disease) [530.81] 11/12/2011   . GENERALIZED ANXIETY DIS [300.02] 11/29/2009   . Major depressive disorder, recurrent episode, unspecified [296.30] 11/29/2009     Suicidal thoughts in the past, did not do well with fluoxetine.  Has tolerated sertraline well.       Marland Kitchen BENIGN HYPERTENSION [401.1] 11/29/2009     Normal echo 01/2010     . PURE HYPERCHOLESTEROLEM [272.0] 11/29/2009     Did not tolerate statin              MEDICATIONS:  Reviewed and updated in the electronic medical record under Medications.      ALLERGIES:  Reviewed and updated in the electronic medical record under Allergies.      SOCIAL HISTORY:  Reviewed and updated in the electronic medical record under Social History.    FAMILY HISTORY:  Reviewed and updated in the electronic medical record under Family History.        Physical Exam   Constitutional: He is oriented to person, place, and time. He appears well-developed and well-nourished. No distress.   Cardiovascular: Normal rate, regular rhythm and normal heart sounds.  Exam reveals no gallop and no friction rub.    No murmur heard.  Pulmonary/Chest: Effort normal and breath sounds normal. No respiratory distress. He has no wheezes. He has no rales.   Neurological: He is alert and oriented to person, place, and time.   Skin: He is not diaphoretic.   Psychiatric: He has a normal mood and affect. His behavior is normal. Judgment and thought content normal.     ASSESSMENT AND PLAN:  HTN:  Controlled, no changes as  the lisinopril is best to continue due to renal protection.      Elevated cholesterol:  Did not check as pt cannot tolerate cholesterol medication.      Weight gain:  Discussed exercise again with the patient and demonstrated looking at calories on line to the places he eats.  He is interested int he exercise, but not likely to make changes with calories.  Overall, his diet is better for him, but the calories are still likely too high.        GERD:  Using PPI as a prn.       Anxiety:  Currently controlled.      Elevated creatinine:  Checking again today.  Marland Kitchen

## 2012-11-24 ENCOUNTER — Ambulatory Visit (INDEPENDENT_AMBULATORY_CARE_PROVIDER_SITE_OTHER): Payer: BLUE CROSS/BLUE SHIELD | Admitting: Internal Medicine

## 2013-01-19 ENCOUNTER — Telehealth (INDEPENDENT_AMBULATORY_CARE_PROVIDER_SITE_OTHER): Payer: Self-pay | Admitting: Internal Medicine

## 2013-01-19 ENCOUNTER — Ambulatory Visit (INDEPENDENT_AMBULATORY_CARE_PROVIDER_SITE_OTHER): Payer: BLUE CROSS/BLUE SHIELD | Admitting: Internal Medicine

## 2013-01-19 VITALS — BP 130/80 | HR 80 | Temp 98.6°F | Wt 225.4 lb

## 2013-01-19 MED ORDER — OMEPRAZOLE 20 MG OR CPDR
DELAYED_RELEASE_CAPSULE | ORAL | Status: DC
Start: 2013-01-19 — End: 2013-11-11

## 2013-01-19 MED ORDER — SILDENAFIL CITRATE 50 MG OR TABS
25.0000 mg | ORAL_TABLET | Freq: Every day | ORAL | Status: DC | PRN
Start: 2013-01-19 — End: 2013-11-11

## 2013-01-19 MED ORDER — LISINOPRIL 10 MG OR TABS
5.0000 mg | ORAL_TABLET | Freq: Every day | ORAL | Status: DC
Start: 2013-01-19 — End: 2013-11-11

## 2013-01-19 NOTE — Telephone Encounter (Signed)
Message copied by Drema Balzarine on Sat Jan 19, 2013  1:18 PM  ------       Message from: Mindi Slicker       Created: Sat Jan 19, 2013 10:34 AM       Regarding: RE: Lab Appointments         Testosterone soon, 0800 on a Saturday.       Other labs, listed in the AVS, due in 6 months 1 week prior to my next visit.                jenn azen       ----- Message -----          From: Drema Balzarine          Sent: 01/19/2013   9:55 AM            To: Lenor Coffin       Subject: Lab Appointments                                           Dr. Carmina Miller:              Quick question? Patient confused about his labs. Stating they are time sensitive. Wants to schedule a lab appointment on a Saturday a week before his appointment with you next year. Needs to know what time he should schedule his blood work for? And his testosterone?               Please advise.              Britt Boozer, PSR         ------

## 2013-01-19 NOTE — Progress Notes (Signed)
CHIEF COMPLAINT:  Dustin Weaver is a 52 year old male here to discuss blood pressure and anxiety.    HISTORY OF PRESENT ILLNESS OR INTERVAL HISTORY:  Pt is having ED issues for a year or so.  He has issues with getting an erection and keeping an erection.    He does not drink, is not taking anxiety medication.      CV risk is 7.7%        Review of Systems   Constitutional: Negative for unexpected weight change.   Musculoskeletal: Negative.    Psychiatric/Behavioral: Negative.        PROBLEM LIST:  Reviewed and updated in the electronic medical record under Problem List.    This includes pertinent past medical and surgical history.    Patient Active Problem List    Diagnosis Date Noted   . GERD (gastroesophageal reflux disease) [530.81] 11/12/2011   . GENERALIZED ANXIETY DIS [300.02] 11/29/2009   . Major depressive disorder, recurrent episode, unspecified [296.30] 11/29/2009     Suicidal thoughts in the past, did not do well with fluoxetine.  Has tolerated sertraline well.       Marland Kitchen BENIGN HYPERTENSION [401.1] 11/29/2009     Normal echo 01/2010     . PURE HYPERCHOLESTEROLEM [272.0] 11/29/2009     Did not tolerate statin           MEDICATIONS:  Reviewed and updated in the electronic medical record under Medications.      ALLERGIES:  Reviewed and updated in the electronic medical record under Allergies.      SOCIAL HISTORY:  Reviewed and updated in the electronic medical record under Social History.    FAMILY HISTORY:  Reviewed and updated in the electronic medical record under Family History.        Physical Exam   Constitutional: He is oriented to person, place, and time. He appears well-developed and well-nourished. No distress.   Cardiovascular: Normal rate, regular rhythm and normal heart sounds.  Exam reveals no gallop and no friction rub.    No murmur heard.  Pulmonary/Chest: Effort normal and breath sounds normal. No respiratory distress. He has no wheezes. He has no rales.   Neurological: He is alert and oriented to  person, place, and time.   Skin: He is not diaphoretic.   Psychiatric: He has a normal mood and affect. His behavior is normal. Judgment and thought content normal.     ASSESSMENT AND PLAN:  HTN:  Controlled on lisinopril     Elevated cholesterol:  CV 7.7%, pt does not want statin.        GERD:  Using PPI as a prn.       Anxiety:  Currently controlled.      Elevated creatinine:  Checking next visit.    Erectile dysfunction:  Pt with ED, starting sildenafil, checking testosterone level.

## 2013-01-19 NOTE — Progress Notes (Signed)
Here for refills   Refills are pending     Vaccine Screening Questions      1.  Have you had a serious reaction or an allergic reaction to a vaccine?  NO    2.  Currently have a moderate or severe illness, including fever? (Don't Ask if vaccine   ordered by provider same day)  NO    3.  Ever had a seizure or a brain or other nervous system problem syndrome associated with a vaccine? (DTaP/TDaP/DTP pertinent) NO    4.  Is patient receiving  any live vaccinations today? (Varicella-Chickenpox, MMR-Measles/Mumps/Rubella, Zoster-Shingles)  NO    If YES to any of the questions above - Do NOT give vaccine.  Consult with RN or provider in clinic.  (#4 can be YES if all Live vaccine questions are answered NO)    If NO to all questions above - Patient may receive vaccine.    5.  Do you need to receive the Flu vaccine today? YES - Additional Flu Questions  Flu Vaccine Screening Questions:    Ever had a serious allergic reaction to eggs?  NO    Ever had Guillain-Barre syndrome associated with a vaccine? NO    Less than 6 months old? NO    If YES to any of the Flu questions above - NO Flu Vaccine to be given.  Patient may consult provider as needed.    If NO to all questions above - Patient may receive Flu Shot (IM)    If between 6 months and 79 years of age, was flu vaccine received last year?  YES  If NO to above question:  . For the 2014-15 season, administer 2 doses (separated by at least 4 weeks) to children who are receiving influenza vaccine for the first time.   . For the 2014-15 season, follow dosing guidelines in the 2013 ACIP influenza vaccine recommendations     Flu Mist (Nasal) may be considered if patient is >2 yrs old and < 41 yrs old.    Do you wish to proceed with screening questions for Flu Mist?  NO     Vaccine information sheet(s) discussed, patient/parent/guardian verbalized understanding? YES     VIS given 01/19/2013 by Malachy Moan MA.

## 2013-01-19 NOTE — Patient Instructions (Signed)
1 hour (range: 30 minutes to 4 hours)

## 2013-01-22 NOTE — Telephone Encounter (Signed)
CCRs if pt calls back please assist in scheduling a Testosterone shot for Saturday 0800 some time soon and a lab appt around 06/29/13

## 2013-01-23 ENCOUNTER — Encounter (INDEPENDENT_AMBULATORY_CARE_PROVIDER_SITE_OTHER): Payer: Self-pay | Admitting: Internal Medicine

## 2013-01-23 NOTE — Telephone Encounter (Signed)
Called patient again left voicemail to call the clinic back.    CCR-patient calls back please schedule Testosterone shot for Saturday 0800 some time soon and a lab appt around 06/29/13    Thanks     Sending letter     Closing TE

## 2013-01-26 ENCOUNTER — Other Ambulatory Visit (INDEPENDENT_AMBULATORY_CARE_PROVIDER_SITE_OTHER): Payer: BLUE CROSS/BLUE SHIELD

## 2013-01-26 LAB — TESTOSTERONE (ALL AGES): Testosterone: 2.8 ng/mL (ref 1.7–6.3)

## 2013-01-29 ENCOUNTER — Other Ambulatory Visit (INDEPENDENT_AMBULATORY_CARE_PROVIDER_SITE_OTHER): Payer: Self-pay | Admitting: Internal Medicine

## 2013-02-01 NOTE — Addendum Note (Signed)
Addended by: Mindi Slicker on: 02/01/2013 10:28 PM     Modules accepted: Orders

## 2013-02-05 ENCOUNTER — Telehealth (INDEPENDENT_AMBULATORY_CARE_PROVIDER_SITE_OTHER): Payer: Self-pay | Admitting: Family Medicine

## 2013-02-05 ENCOUNTER — Ambulatory Visit (INDEPENDENT_AMBULATORY_CARE_PROVIDER_SITE_OTHER): Payer: Worker's Compensation | Admitting: Family Medicine

## 2013-02-05 VITALS — BP 140/86 | HR 111 | Temp 98.1°F

## 2013-02-05 MED ORDER — METHYLPREDNISOLONE 4 MG OR TBPK
ORAL_TABLET | ORAL | Status: DC
Start: 2013-02-05 — End: 2013-11-11

## 2013-02-05 NOTE — Progress Notes (Signed)
Cc L and I back pain    hpi  He has been having Right hip pain. It started  while cranking the cranks  He works for [employer], and he was unhooking the trailer from the front part of the truck,     As he was turning the crank , he felt severe pain that took him to the ground    He Hospital doctor to [employer]. Yesterday.  Had a cortisone shot?     The Pain has been rad down right post leg  And a bit to Groin also    This is the 3rd time in the general region.   The Second time was 4 mo ago.    He  Had PT and did traction after being seen at [facility]. He was Released back to work without problems.    The first time was mild, about 3 yrs ago. And he recovered and did fine until 4 months ago. Then recovered, until yesterday    Soc works for [employer]    Ros neg wt loss, neg cp. Sob neg GI neg GU or loss of bowel or bladder    O Blood pressure 140/86, pulse 111, temperature 98.1 F (36.7 C), temperature source Oral, SpO2 93.00%.  Uncomfortable to move., slowly  Back some tenderness paraspinous muscles  SLR pos right with radiation of pain  Hip ROM nl  Left side SLR neg  DTR 1 + symm    Ap 1. Lumbar radicular pain    To add medrol  Modified work   Letter written  To see rehab MD for further evaluation and tx    - REFERRAL TO PHYS MED/REHAB ADULT  - MethylPREDNISolone, Pak, (MEDROL, PAK,) 4 MG Oral Tab; Take by mouth as directed on package labeling for duration of 6 days.  Dispense: 21 tablet; Refill: 0

## 2013-02-05 NOTE — Telephone Encounter (Signed)
Deb  He is an L & I injury  Normally pcp in [facility]  He needs rehab or ortho  But [facility] is not avail until Jan  Can you refer him to rehab md or ortho md but in the [place] area.  And then change the referral    Thanks  Evangelos Paulino

## 2013-02-05 NOTE — Patient Instructions (Signed)
It was a pleasure to see you in clinic today.                  If you are not yet signed up for eCare, please speak with a team member at the front desk who would happy to assist you with this process.      eCare  enrollment will allow you access to the below benefits   You can make appointments online   View test results / Lab Results   Obtain a copy of our After Visit Summary (a summary of your visit today)     Your Test Results:  If labs were ordered today the results are expected to be available via eCare in about 5 days. If you have an active eCare account, this is how we will notify you of your results.     If you do not have an eCare account then your test results will be mailed to you within about 14 days after your tests are completed. If your physician needs to change your care based on your results or is concerned, you will receive a phone call.     If you have any questions about your test results please schedule an appointment with your provider.    **If it has been more than 2 weeks and you have not received your test results please send our office a message via eCare.    Medication Refills: If you need a prescription refilled, please contact your pharmacy 1 week before your current supply will run out to request the refill.  Contacting your pharmacy is the fastest and safest way to obtain a medication refill.  The pharmacy will notify our office.  Please note, that a minimum of 48 to 72 hours is needed to refill a medication, but refills are usually processed and sent to your pharmacy in about 5 business days.  Please call your pharmacy early to allow enough time to refill before you anticipate running out.  For faster medication refills, you can also schedule an appointment with your provider.    We know you have a choice in where you receive your healthcare and we sincerely thank you for trusting [facility] with your health.

## 2013-02-06 NOTE — Telephone Encounter (Signed)
Patient request to have his referral send to [facility],their phone # [phone],no fax #,please call him back he is in a lot of pain.

## 2013-02-06 NOTE — Telephone Encounter (Signed)
Routing to Referral Pool

## 2013-02-06 NOTE — Telephone Encounter (Signed)
Referral has been to sent to [facility]. Patient has been notified.

## 2013-02-06 NOTE — Telephone Encounter (Signed)
Please advise. Do you have anyone in mind ?

## 2013-02-06 NOTE — Telephone Encounter (Signed)
CONFIRMED PHONE NUMBER: [phone]  CALLERS FIRST AND LAST NAME: Dustin Weaver  FACILITY NAME: na TITLE: na  CALLERS RELATIONSHIP:Self  RETURN CALL: Detailed message on voicemail only     SUBJECT: General Message   REASON FOR REQUEST: Referral     MESSAGE: Patient states that he was discussing with Dr. Franchot Heidelberg for being seen somewhere else for his referral as discussed below. Please call patient as soon as possible thank you.

## 2013-02-11 NOTE — Addendum Note (Signed)
Addended by: Otho Darner on: 02/11/2013 11:31 AM     Modules accepted: Orders

## 2013-06-20 ENCOUNTER — Encounter (INDEPENDENT_AMBULATORY_CARE_PROVIDER_SITE_OTHER): Payer: Self-pay

## 2013-06-20 ENCOUNTER — Ambulatory Visit (INDEPENDENT_AMBULATORY_CARE_PROVIDER_SITE_OTHER): Payer: BLUE CROSS/BLUE SHIELD | Admitting: Family Medicine

## 2013-06-20 VITALS — BP 142/92 | HR 95 | Temp 99.0°F | Resp 18 | Ht 70.0 in | Wt 230.0 lb

## 2013-06-20 DIAGNOSIS — M549 Dorsalgia, unspecified: Secondary | ICD-10-CM

## 2013-06-20 NOTE — Patient Instructions (Signed)
Patient sent to the ER for further evaluation and treatment.  Offered and decline ambulance.

## 2013-06-20 NOTE — Progress Notes (Signed)
Chief Complaint   Patient presents with   . Back Injury     problem diagnosed in 03/2013        SUBJECTIVE:  Dustin Weaver is an 53 year old male who presents with severe low back pain from a work injury 03/2013.  Concerns because he has re injured his back lifting a heavy freight yesterday.  Mentions that his L&I claim was closed and work physician will not see him and was told to f/u with primary care.    Primary care then referred him to UC.    Currently has weakness in both legs from the waist down.  NO numbness. NO loss of bowel or bladder control.  Pain level 5-10/10.  Mentions that his L&I claim is under legal review.    Outpatient Prescriptions Prior to Visit   Medication Sig Dispense Refill   . Aspirin 81 MG Oral Tab 1 tab daily  30 Tab  0   . Lisinopril 10 MG Oral Tab Take 0.5 tablets (5 mg) by mouth daily.  45 tablet  3   . MethylPREDNISolone, Pak, (MEDROL, PAK,) 4 MG Oral Tab Take by mouth as directed on package labeling for duration of 6 days.  21 tablet  0   . Omeprazole 20 MG Oral CAPSULE DELAYED RELEASE TAKE ONE CAPSULE BY MOUTH DAILY AS NEEDED FOR REFLUX.  90 capsule  3   . Sildenafil Citrate 50 MG Oral Tab Take 0.5-1 tablets (25-50 mg) by mouth daily as needed for erectile dysfunction.  5 tablet  11     No facility-administered medications prior to visit.       Review of patient's allergies indicates:  Allergies   Allergen Reactions   . Niacin        History   Substance Use Topics   . Smoking status: Never Smoker    . Smokeless tobacco: Not on file   . Alcohol Use: Not on file       OBJECTIVE:  BP 142/92  Pulse 95  Temp(Src) 99 F (37.2 C) (Temporal)  Resp 18  Ht 5\' 10"  (1.778 m)  Wt 230 lb (104.327 kg)  BMI 33 kg/m2  SpO2 94%           Review of Systems   Constitutional: Negative for fever and chills.   Musculoskeletal: Positive for back pain.       Physical Exam   Constitutional: He is oriented to person, place, and time. He appears well-developed and well-nourished. No distress.      Musculoskeletal:        Lumbar back: He exhibits decreased range of motion (unable to flex forwar past 90 degrees with out pain or bend backwards), bony tenderness (L4-S1 level), pain and spasm. He exhibits no swelling.   Neurological: He is alert and oriented to person, place, and time.   Skin: He is not diaphoretic.   Nursing note and vitals reviewed.    ASSESSMENT/PLAN  (724.5) Back pain with radiation  (primary encounter diagnosis)  Plan:   L&I F/U and re injury    Patient sent to the ER for further evaluation and treatment (may need MRI).  Offered and decline ambulance.  Left via private vehicle to [facility] center.        Discussed medication in detail including dosing, side effects, and interactions.    Fulton Mole, MD  [facility]  Urgent Care

## 2013-06-20 NOTE — Progress Notes (Signed)
Medications, allergies, vitals verified by Dawn/CMA

## 2013-06-29 ENCOUNTER — Other Ambulatory Visit (INDEPENDENT_AMBULATORY_CARE_PROVIDER_SITE_OTHER): Payer: BLUE CROSS/BLUE SHIELD

## 2013-07-05 ENCOUNTER — Telehealth (INDEPENDENT_AMBULATORY_CARE_PROVIDER_SITE_OTHER): Payer: Self-pay | Admitting: Internal Medicine

## 2013-07-05 NOTE — Telephone Encounter (Signed)
Called and left detailed message for pt that his refills on both medications should be good until 10/15, and all he needed to do was call and have pharmacy reill the rx's.

## 2013-07-05 NOTE — Telephone Encounter (Signed)
CONFIRMED PHONE NUMBER: [phone]   CALLERS FIRST AND LAST NAME: Graysen Woodyard Lippard   FACILITY NAME: na TITLE: na  CALLERS RELATIONSHIP:Self  RETURN CALL: Detailed message on voicemail only     SUBJECT: General Message   REASON FOR REQUEST: patient had to reschedule due to provider being out on satuday, he scheduled the next Saturday on 06/13   And he said Blood pressure and acid reflex medication may be out before his appointment on 06/13 would like someone to check and call in prescription if needed.     MESSAGE: please follow up and advise

## 2013-07-06 ENCOUNTER — Encounter (INDEPENDENT_AMBULATORY_CARE_PROVIDER_SITE_OTHER): Payer: BLUE CROSS/BLUE SHIELD | Admitting: Internal Medicine

## 2013-09-14 ENCOUNTER — Encounter (INDEPENDENT_AMBULATORY_CARE_PROVIDER_SITE_OTHER): Payer: BLUE CROSS/BLUE SHIELD | Admitting: Internal Medicine

## 2013-11-08 ENCOUNTER — Encounter (INDEPENDENT_AMBULATORY_CARE_PROVIDER_SITE_OTHER): Payer: Self-pay

## 2013-11-08 ENCOUNTER — Telehealth (INDEPENDENT_AMBULATORY_CARE_PROVIDER_SITE_OTHER): Payer: Self-pay | Admitting: Internal Medicine

## 2013-11-08 NOTE — Telephone Encounter (Signed)
Spoke to the patient and he states this injury is an L&I claim and he is seeing an Orthopedic Specialist for this. No ov is needed.     Closing TE

## 2013-11-08 NOTE — Telephone Encounter (Signed)
Please see ER/Urgent Care report Scan    Reason for visit: Arm Injury    F/U appt made yet:No    Routing to [facility] pool to offer ER follow up

## 2013-11-11 ENCOUNTER — Encounter (INDEPENDENT_AMBULATORY_CARE_PROVIDER_SITE_OTHER): Payer: Self-pay | Admitting: Internal Medicine

## 2013-11-11 ENCOUNTER — Ambulatory Visit (INDEPENDENT_AMBULATORY_CARE_PROVIDER_SITE_OTHER): Payer: BLUE CROSS/BLUE SHIELD | Admitting: Internal Medicine

## 2013-11-11 VITALS — BP 128/81 | HR 81 | Temp 99.3°F | Wt 226.0 lb

## 2013-11-11 DIAGNOSIS — F339 Major depressive disorder, recurrent, unspecified: Secondary | ICD-10-CM

## 2013-11-11 DIAGNOSIS — Z125 Encounter for screening for malignant neoplasm of prostate: Secondary | ICD-10-CM

## 2013-11-11 DIAGNOSIS — B079 Viral wart, unspecified: Secondary | ICD-10-CM

## 2013-11-11 DIAGNOSIS — F411 Generalized anxiety disorder: Secondary | ICD-10-CM

## 2013-11-11 DIAGNOSIS — I10 Essential (primary) hypertension: Secondary | ICD-10-CM

## 2013-11-11 DIAGNOSIS — K219 Gastro-esophageal reflux disease without esophagitis: Secondary | ICD-10-CM

## 2013-11-11 DIAGNOSIS — Z1211 Encounter for screening for malignant neoplasm of colon: Secondary | ICD-10-CM

## 2013-11-11 MED ORDER — OMEPRAZOLE 20 MG OR CPDR
DELAYED_RELEASE_CAPSULE | ORAL | Status: DC
Start: 2013-11-11 — End: 2014-05-10

## 2013-11-11 NOTE — Patient Instructions (Addendum)
Cetirizine 10mg  daily.  flonase or nasonex  Hot water wash of sheets weekly.

## 2013-11-11 NOTE — Progress Notes (Signed)
CHIEF COMPLAINT:  Mr. Sulkowski is a 53 year old male here to discuss blood pressure and anxiety.    HISTORY OF PRESENT ILLNESS OR INTERVAL HISTORY:  BP is good, but he stopped his BP meds about 2 months ago.  He has noted an improvement in erectile dysfunction.    He has verrucous lesion on the L forearm.    Pt is having ED issues for a year or so.  He has issues with getting an erection and keeping an erection.    He does not drink, is not taking anxiety medication.      CV risk is 7.7%        Review of Systems   Constitutional: Negative for unexpected weight change.   Musculoskeletal: Negative.    Psychiatric/Behavioral: Negative.        PROBLEM LIST:  Reviewed and updated in the electronic medical record under Problem List.    This includes pertinent past medical and surgical history.    Patient Active Problem List    Diagnosis Date Noted   . GERD (gastroesophageal reflux disease) [530.81] 11/12/2011   . GENERALIZED ANXIETY DIS [300.02] 11/29/2009   . Major depressive disorder, recurrent episode, unspecified [296.30] 11/29/2009     Suicidal thoughts in the past, did not do well with fluoxetine.  Has tolerated sertraline well.       Marland Kitchen BENIGN HYPERTENSION [401.1] 11/29/2009     Normal echo 01/2010     . PURE HYPERCHOLESTEROLEM [272.0] 11/29/2009     Did not tolerate statin           MEDICATIONS:  Reviewed and updated in the electronic medical record under Medications.      ALLERGIES:  Reviewed and updated in the electronic medical record under Allergies.      SOCIAL HISTORY:  Reviewed and updated in the electronic medical record under Social History.    FAMILY HISTORY:  Reviewed and updated in the electronic medical record under Family History.        Physical Exam   Constitutional: He is oriented to person, place, and time. He appears well-developed and well-nourished. No distress.   Cardiovascular: Normal rate, regular rhythm and normal heart sounds.  Exam reveals no gallop and no friction rub.    No murmur  heard.  Pulmonary/Chest: Effort normal and breath sounds normal. No respiratory distress. He has no wheezes. He has no rales.   Neurological: He is alert and oriented to person, place, and time.   Skin: He is not diaphoretic.   Psychiatric: He has a normal mood and affect. His behavior is normal. Judgment and thought content normal.     ASSESSMENT AND PLAN:  HTN:  Controlled off lisinopril, will check frequently off BP medication.      Elevated cholesterol:  CV 7.7%, pt does not want statin.        GERD:  Using PPI as a prn.       Anxiety:  Currently controlled.      Elevated creatinine:  Checking today, blood pressure is well-controlled.    Warts  I froze 7 large warts on his left forearm with liquid nitrogen for 10 second freeze 3 he was encouraged to continue to use Compound W or similar substance for further management and can come in in approximately 3-4 weeks for further freezing.    Procedure: wart freeze  Indication: multiple large warts  Pre treatment:no  Treatment: liquid nitro freeze with gun x 10 seconds, 3 rounds  Complications: no  Post-treatment: salicylic acid treatment.        Family history of prostate cancer:  Multiple family members, PSA ordered.      Colon cancer screen:  Stool cards ordered.

## 2013-11-11 NOTE — Progress Notes (Signed)
Reason for visit: Pt. Here for f/u HTN.     Have you seen a specialist since your last visit:  Yes   Orthopedic @ [place] last Friday .          08/09/11 Forecasted, patient up to date, dji    Health Maintenance   Topic Date Due   . COLON CANCER SCREENING,FOBT/FIT  12/05/2012   . INFLUENZA >9 YRS  12/03/2013   . CHOLESTEROL- MALE  11/11/2016   . TETANUS BOOSTER  03/04/2021       No future appointments.

## 2013-11-12 LAB — BASIC METABOLIC PANEL
Anion Gap: 8 (ref 4–12)
Calcium: 9.8 mg/dL (ref 8.9–10.2)
Carbon Dioxide, Total: 26 mEq/L (ref 22–32)
Chloride: 104 mEq/L (ref 98–108)
Creatinine: 1.2 mg/dL — ABNORMAL HIGH (ref 0.51–1.18)
GFR, Calc, African American: >60 mL/min (ref >59)
GFR, Calc, European American: >60 mL/min (ref >59)
Glucose: 94 mg/dL (ref 62–125)
Potassium: 4 mEq/L (ref 3.6–5.2)
Sodium: 138 mEq/L (ref 135–145)
Urea Nitrogen: 15 mg/dL (ref 8–21)

## 2013-11-12 LAB — PSA, DIAGNOSTIC/MONITORING: PSA, Diagnostic/Monitoring: 1.16 ng/mL (ref 0.00–4.00)

## 2013-11-14 ENCOUNTER — Other Ambulatory Visit (INDEPENDENT_AMBULATORY_CARE_PROVIDER_SITE_OTHER): Payer: Self-pay | Admitting: Internal Medicine

## 2013-11-14 DIAGNOSIS — Z1211 Encounter for screening for malignant neoplasm of colon: Secondary | ICD-10-CM

## 2013-11-15 LAB — OCCULT BLOOD BY IA, STL: Occult Bld 1 Result: NEGATIVE

## 2013-11-26 ENCOUNTER — Encounter (INDEPENDENT_AMBULATORY_CARE_PROVIDER_SITE_OTHER): Payer: Self-pay | Admitting: Internal Medicine

## 2013-11-29 HISTORY — PX: SURGICAL HX OTHER: 99

## 2013-12-02 ENCOUNTER — Encounter (INDEPENDENT_AMBULATORY_CARE_PROVIDER_SITE_OTHER): Payer: Self-pay | Admitting: Internal Medicine

## 2013-12-18 ENCOUNTER — Telehealth (INDEPENDENT_AMBULATORY_CARE_PROVIDER_SITE_OTHER): Payer: Self-pay | Admitting: Internal Medicine

## 2013-12-18 DIAGNOSIS — I10 Essential (primary) hypertension: Secondary | ICD-10-CM

## 2013-12-18 NOTE — Telephone Encounter (Signed)
Patient is requesting to have a prescription of Saten for his cholesterol. He would like to start taking the medication. Routing to internal med.

## 2013-12-19 MED ORDER — PRAVASTATIN SODIUM 20 MG OR TABS
20.0000 mg | ORAL_TABLET | Freq: Every day | ORAL | Status: DC
Start: 2013-12-19 — End: 2014-05-10

## 2013-12-19 NOTE — Telephone Encounter (Signed)
Reviewed last chart note; he has had issues with tolerating statins in the past but did discuss a statin with Dr. Hilda Blades at his last visit. I will go ahead and rx pravastatin which is usually well tolerated. Please have him plan to follow-up in 3 months. Thank you!

## 2013-12-19 NOTE — Telephone Encounter (Signed)
To internal pool

## 2013-12-20 NOTE — Telephone Encounter (Signed)
Pt stopped in, I advised of the message below. Pt can only come in on Saturdays due to work schedule. Soonest Saturday appt with PCP is 02/06. Pt didn't want to schedule. States he will try back later. Also states if he is not able to come in with in the 3 months because of her schedule he hopes there will be no problems getting his rx. He just wanted it noted in his chart.    FYI to Clinical

## 2013-12-20 NOTE — Telephone Encounter (Signed)
Pt was advised the medication was prescribed. He was referring to his next set of refills.     Closing TE

## 2013-12-20 NOTE — Telephone Encounter (Signed)
Pt returning Jackie's call. Unable to get through on her line. Sending TE to return call. Please call pt at [phone]. Thank you.

## 2013-12-20 NOTE — Telephone Encounter (Signed)
The rx for pravastatin has already been sent in, please help schedule appt for next available.

## 2014-02-21 ENCOUNTER — Ambulatory Visit (INDEPENDENT_AMBULATORY_CARE_PROVIDER_SITE_OTHER): Payer: BLUE CROSS/BLUE SHIELD

## 2014-02-25 ENCOUNTER — Ambulatory Visit (INDEPENDENT_AMBULATORY_CARE_PROVIDER_SITE_OTHER): Payer: BLUE CROSS/BLUE SHIELD

## 2014-02-25 DIAGNOSIS — Z23 Encounter for immunization: Secondary | ICD-10-CM

## 2014-02-25 NOTE — Progress Notes (Signed)
Vaccine Screening Questions      1.  Have you had a serious reaction or an allergic reaction to a vaccine?  NO    2.  Currently have a moderate or severe illness, including fever? (Don't Ask if vaccine   ordered by provider same day)  NO    3.  Ever had a seizure or a brain or other nervous system problem syndrome associated with a vaccine? (DTaP/TDaP/DTP pertinent) NO    4.  Is patient receiving  any live vaccinations today? (Varicella-Chickenpox, MMR-Measles/Mumps/Rubella, Zoster-Shingles)  NO    If YES to any of the questions above - Do NOT give vaccine.  Consult with RN or provider in clinic.  (#4 can be YES if all Live vaccine questions are answered NO)    If NO to all questions above - Patient may receive vaccine.    5.  Do you need to receive the Flu vaccine today? YES - Additional Flu Questions  Flu Vaccine Screening Questions:    Ever had a serious allergic reaction to eggs?  NO    Ever had Guillain-Barre syndrome associated with a vaccine? NO    Less than 6 months old? NO    If YES to any of the Flu questions above - NO Flu Vaccine to be given.  Patient may consult provider as needed.    If NO to all questions above - Patient may receive Flu Shot (IM)    If between 6 months and 79 years of age, was flu vaccine received last year?  N/A  If NO to above question:  . For the 2014-15 season, administer 2 doses (separated by at least 4 weeks) to children who are receiving influenza vaccine for the first time.   . For the 2014-15 season, follow dosing guidelines in the 2013 ACIP influenza vaccine recommendations     Flu Mist (Nasal) may be considered if patient is >2 yrs old and < 33 yrs old.    Do you wish to proceed with screening questions for Flu Mist?  NO     Vaccine information sheet(s) discussed, patient/parent/guardian verbalized understanding? YES     VIS given 02/25/2014 by Belia Heman  Grygelaityte MA.      Vaccine given today without initial adverse effect. YES    [place], Grygelaityte MA

## 2014-03-12 ENCOUNTER — Telehealth (INDEPENDENT_AMBULATORY_CARE_PROVIDER_SITE_OTHER): Payer: Self-pay | Admitting: Internal Medicine

## 2014-03-12 NOTE — Telephone Encounter (Signed)
CONFIRMED PHONE NUMBER: [phone]  CALLERS FIRST AND LAST NAME: Dustin Weaver  FACILITY NAME: n/a TITLE: n/a  CALLERS RELATIONSHIP:Self  RETURN CALL: Detailed message on voicemail only     SUBJECT: Appointment Request   REASON FOR REQUEST: Sooner appointment.     REQUEST APPOINTMENT WITH: Dr Hilda Blades or available provider  REFERRING PROVIDER: n/a  REQUESTED DATE: As soon as possible  REQUESTED TIME: flexible  UNABLE TO APPOINT: Schedule appears full

## 2014-03-12 NOTE — Telephone Encounter (Signed)
Scheduled. Thank you.

## 2014-03-13 ENCOUNTER — Ambulatory Visit (INDEPENDENT_AMBULATORY_CARE_PROVIDER_SITE_OTHER): Payer: BLUE CROSS/BLUE SHIELD | Admitting: Family Practice

## 2014-03-13 ENCOUNTER — Encounter (INDEPENDENT_AMBULATORY_CARE_PROVIDER_SITE_OTHER): Payer: Self-pay | Admitting: Family Practice

## 2014-03-13 VITALS — BP 130/94 | HR 110 | Temp 99.9°F | Resp 24 | Wt 231.0 lb

## 2014-03-13 DIAGNOSIS — G4733 Obstructive sleep apnea (adult) (pediatric): Secondary | ICD-10-CM

## 2014-03-13 DIAGNOSIS — J321 Chronic frontal sinusitis: Secondary | ICD-10-CM

## 2014-03-13 NOTE — Patient Instructions (Addendum)
Start Prilosec daily     Restart Flonase for swelling in sinus area   NETI pot with distilled water ---> you can pick up the neti pot kits at any grocery store     Could consider ENT consult for future

## 2014-03-13 NOTE — Progress Notes (Signed)
CC/HPI: Dustin Weaver is a 53 year old male who comes today with the following concerns:  Sleep apnea     States that he is worried that the sleep study /use of sleep apnea may be "blocking something bigger"  States that he feels like for last two years he feels a "golf ball " in his sinuses. States that breathing out through nose is difficult.   States that when he is relaxed he will notice difficult breathing. Feels like a "wad" of cotton is stuck in his sinus.   States that he feels something in the back of his throat all the time, and with swallowing. 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 HEPATITIS C SCREENING  1961/03/15   . HIV SCREENING  12/05/1977   . COLON CANCER SCREENING,FOBT/FIT  11/13/2014   . CHOLESTEROL- MALE  11/11/2016   . TETANUS BOOSTER  03/04/2021   . INFLUENZA VACCINE  Completed     Patient Active Problem List   Diagnosis   . GENERALIZED ANXIETY DIS   . Major depressive disorder, recurrent episode, unspecified   . BENIGN HYPERTENSION   . PURE HYPERCHOLESTEROLEM   . GERD (gastroesophageal reflux disease)     BP 130/94 mmHg  Pulse 110  Temp(Src) 99.9 F (37.7 C) (Temporal)  Resp 24  Wt 231 lb (104.781 kg)  SpO2 95%    Review of Systems   Constitutional: Negative for fever and weight loss.   HENT:        Denies any congestion   Respiratory: Negative for cough and stridor.    Gastrointestinal: Negative for nausea and vomiting.     Physical Exam   Constitutional: He is well-developed, well-nourished, and in no distress.   HENT:   Right Ear: Tympanic membrane normal.   Left Ear: Tympanic membrane normal.   Nose: Mucosal edema, rhinorrhea and septal deviation present. Right sinus exhibits no maxillary sinus tenderness and no frontal sinus tenderness. Left sinus exhibits no maxillary sinus tenderness and no  frontal sinus tenderness.   Mouth/Throat: Uvula is midline and oropharynx is clear and moist. No oropharyngeal exudate or posterior oropharyngeal erythema.   Tonsil +1 bilaterally   Cardiovascular: Normal rate and regular rhythm.    Pulmonary/Chest: Effort normal and breath sounds normal.   Vitals reviewed.       A/P  (J32.1) Frontal sinusitis, unspecified chronicity  (primary encounter diagnosis)  Plan: [facility]FERRAL [facility]            (G47.33) Obstructive sleep apnea syndrome  Plan: REFERRAL TO [facility], CANCELED:         REFERRAL TO [facility]        Patient needs CPAP mask , will refer to sleep center to assist in this      Follow up if s/s change or worsen.      Patient agrees with plan of care.  Discussed with the patient and all questioned fully answered. Dustin Weaver will call me if any problems arise.     See patient education.

## 2014-03-13 NOTE — Progress Notes (Signed)
Reason for visit: Consult for sleep apnea.     Have you seen a specialist since your last visit: YES      [facility] Dr. Katherine Basset    08/09/11 Forecasted, patient up to date, dji    Health Maintenance   Topic Date Due   . HEPATITIS C SCREENING  06/[birth year]/1962   . HIV SCREENING  12/05/1977   . COLON CANCER SCREENING,FOBT/FIT  11/13/2014   . CHOLESTEROL- MALE  11/11/2016   . TETANUS BOOSTER  03/04/2021   . INFLUENZA VACCINE  Completed       Future Appointments  Date Time Provider [place]   03/13/2014 3:45 PM Asencion Noble [facility]   05/10/2014 11:00 AM Azen, Mort Sawyers [facility]

## 2014-03-14 ENCOUNTER — Telehealth (INDEPENDENT_AMBULATORY_CARE_PROVIDER_SITE_OTHER): Payer: Self-pay | Admitting: Internal Medicine

## 2014-03-14 NOTE — Telephone Encounter (Signed)
CONFIRMED PHONE NUMBER: [phone]  CALLERS FIRST AND LAST NAME: Stanly Si Scrivner  FACILITY NAME: na TITLE: na  CALLERS RELATIONSHIP:Self  RETURN CALL: Detailed message on voicemail only     SUBJECT: General Message   REASON FOR REQUEST: Questions about CPAP machine    MESSAGE: Patient called in requesting specifically to speak with Dr. Hilda Blades or her nurse about how to obtain a CPAP machine. He needs to figure this out as soon as possible to comply with DOT standards. Please call back to assist. Thank you!

## 2014-03-14 NOTE — Telephone Encounter (Signed)
Patient is going to call sleep MD and MD that did his DOT to see about a rx for CPAP.

## 2014-03-18 ENCOUNTER — Encounter (INDEPENDENT_AMBULATORY_CARE_PROVIDER_SITE_OTHER): Payer: BLUE CROSS/BLUE SHIELD | Admitting: Family Medicine

## 2014-03-18 NOTE — Telephone Encounter (Signed)
Formed faxed and filed

## 2014-05-10 ENCOUNTER — Ambulatory Visit (INDEPENDENT_AMBULATORY_CARE_PROVIDER_SITE_OTHER): Payer: BLUE CROSS/BLUE SHIELD | Admitting: Internal Medicine

## 2014-05-10 VITALS — BP 130/88 | HR 84 | Temp 98.2°F | Ht 70.0 in | Wt 215.0 lb

## 2014-05-10 DIAGNOSIS — Z119 Encounter for screening for infectious and parasitic diseases, unspecified: Secondary | ICD-10-CM

## 2014-05-10 DIAGNOSIS — E7800 Pure hypercholesterolemia, unspecified: Secondary | ICD-10-CM

## 2014-05-10 DIAGNOSIS — I10 Essential (primary) hypertension: Secondary | ICD-10-CM

## 2014-05-10 NOTE — Progress Notes (Signed)
Reason for visit: Pt is here for follow up and to discuss weight loss     Have you seen a specialist since your last visit: NO          05/09/2014 Rene Paci, [place] Maintenance due: Hep C, HIV screens  Last Physical: DUE    Health Maintenance   Topic Date Due   . HEPATITIS C SCREENING  Aug 30, 1960   . HIV SCREENING  12/05/1977   . COLON CANCER SCREENING,FOBT/FIT  11/13/2014   . CHOLESTEROL- MALE  11/11/2016   . TETANUS BOOSTER  03/04/2021   . INFLUENZA VACCINE  Completed       No future appointments.

## 2014-05-10 NOTE — Progress Notes (Signed)
CHIEF COMPLAINT:  Dustin Weaver is a 54 year old male here to discuss htn.      HISTORY OF PRESENT ILLNESS OR INTERVAL HISTORY:  Patient has had significant weight loss since his last visit taking forskolin.  He is also exercising about 5 days a week.  Since the weight loss, his reflux disease is significantly better and he has been able to stop taking his reflux medication.  He tried a statin again, and did not tolerate it due to a severe headache.  He is not taking blood pressure medication and his blood pressure is very good today.    He has also had surgery for his torn biceps tendon and is still trying to recover from that.  There is an issue in his forearm, and he has been referred to physical therapy for that.  He is hopeful that it will be helpful.      Review of Systems   Constitutional: Negative.    Respiratory: Negative.    Cardiovascular: Negative.    Psychiatric/Behavioral: Negative.        PROBLEM LIST:  Reviewed and updated in the electronic medical record under Problem List.    This includes pertinent past medical and surgical history.    Patient Active Problem List    Diagnosis Date Noted   . GERD (gastroesophageal reflux disease) [K21.9] 11/12/2011   . GENERALIZED ANXIETY DIS [F41.1] 11/29/2009   . Major depressive disorder, recurrent episode, unspecified [F33.9] 11/29/2009     Suicidal thoughts in the past, did not do well with fluoxetine.  Has tolerated sertraline well.       Marland Kitchen BENIGN HYPERTENSION [I10] 11/29/2009     Normal echo 01/2010     . PURE HYPERCHOLESTEROLEM [E78.0] 11/29/2009     Did not tolerate statin     . Obstructive sleep apnea syndrome [G47.33] 03/13/2014       MEDICATIONS:  Medication reviewed and updated in chart.      ALLERGIES:  Allergies reviewed and updated in chart.    SOCIAL HISTORY:  Social history reviewed and updated in chart.      Physical Exam   Constitutional: He is oriented to person, place, and time and well-developed, well-nourished, and in no distress. No distress.      Cardiovascular: Normal rate, regular rhythm and normal heart sounds.  Exam reveals no gallop and no friction rub.    No murmur heard.  Pulmonary/Chest: Effort normal and breath sounds normal. No respiratory distress. He has no wheezes. He has no rales.   Abdominal: Soft. Bowel sounds are normal.   Neurological: He is alert and oriented to person, place, and time.   Skin: He is not diaphoretic.   Psychiatric: Mood, memory, affect and judgment normal.         ASSESSMENT AND PLAN:  Hypertension  Well-controlled with diet and exercise.  I have encouraged him to continue good habits.    Elevated cholesterol  His father recently had a quadruple bypass, so he is at high risk of coronary disease.  I've encouraged him to at least take an aspirin, and consider retrying other statins in the future.  He would like to hold off on statins for now given his side effects.    Infectious disease screening  HIV and hepatitis C ordered.

## 2014-08-25 ENCOUNTER — Encounter (INDEPENDENT_AMBULATORY_CARE_PROVIDER_SITE_OTHER): Payer: Self-pay | Admitting: Internal Medicine

## 2014-12-09 ENCOUNTER — Encounter (INDEPENDENT_AMBULATORY_CARE_PROVIDER_SITE_OTHER): Payer: Self-pay | Admitting: Internal Medicine

## 2015-03-19 HISTORY — PX: SURGICAL HX OTHER: 99

## 2015-03-20 ENCOUNTER — Encounter (INDEPENDENT_AMBULATORY_CARE_PROVIDER_SITE_OTHER): Payer: Self-pay

## 2015-03-20 ENCOUNTER — Telehealth (INDEPENDENT_AMBULATORY_CARE_PROVIDER_SITE_OTHER): Payer: Self-pay | Admitting: Internal Medicine

## 2015-03-20 NOTE — Telephone Encounter (Signed)
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

## 2015-03-20 NOTE — Telephone Encounter (Signed)
Planned ortho procedure done yesterday at [facility]

## 2015-03-20 NOTE — Telephone Encounter (Signed)
No follow up needed

## 2015-09-19 ENCOUNTER — Ambulatory Visit (INDEPENDENT_AMBULATORY_CARE_PROVIDER_SITE_OTHER): Payer: BLUE CROSS/BLUE SHIELD | Admitting: Internal Medicine

## 2015-09-19 VITALS — BP 129/86 | HR 77 | Wt 195.5 lb

## 2015-09-19 DIAGNOSIS — N529 Male erectile dysfunction, unspecified: Secondary | ICD-10-CM

## 2015-09-19 DIAGNOSIS — Z119 Encounter for screening for infectious and parasitic diseases, unspecified: Secondary | ICD-10-CM

## 2015-09-19 DIAGNOSIS — Z1211 Encounter for screening for malignant neoplasm of colon: Secondary | ICD-10-CM

## 2015-09-19 DIAGNOSIS — G8929 Other chronic pain: Secondary | ICD-10-CM | POA: Insufficient documentation

## 2015-09-19 DIAGNOSIS — S46212A Strain of muscle, fascia and tendon of other parts of biceps, left arm, initial encounter: Secondary | ICD-10-CM | POA: Insufficient documentation

## 2015-09-19 DIAGNOSIS — E7800 Pure hypercholesterolemia, unspecified: Secondary | ICD-10-CM

## 2015-09-19 DIAGNOSIS — I10 Essential (primary) hypertension: Secondary | ICD-10-CM

## 2015-09-19 DIAGNOSIS — M542 Cervicalgia: Secondary | ICD-10-CM | POA: Insufficient documentation

## 2015-09-19 LAB — LIPID PANEL
Cholesterol (LDL): 179 mg/dL — ABNORMAL HIGH (ref <130)
Cholesterol/HDL Ratio: 6
HDL Cholesterol: 41 mg/dL (ref >39)
Non-HDL Cholesterol: 206 mg/dL — ABNORMAL HIGH (ref 0–159)
Total Cholesterol: 247 mg/dL — ABNORMAL HIGH (ref <200)
Triglyceride: 135 mg/dL (ref <150)

## 2015-09-19 LAB — BASIC METABOLIC PANEL
Anion Gap: 11 (ref 4–12)
Calcium: 9.7 mg/dL (ref 8.9–10.2)
Carbon Dioxide, Total: 22 meq/L (ref 22–32)
Chloride: 106 meq/L (ref 98–108)
Creatinine: 1.09 mg/dL (ref 0.51–1.18)
GFR, Calc, African American: >60 mL/min/{1.73_m2} (ref >59)
GFR, Calc, European American: >60 mL/min/{1.73_m2} (ref >59)
Glucose: 118 mg/dL (ref 62–125)
Potassium: 4.2 meq/L (ref 3.6–5.2)
Sodium: 139 meq/L (ref 135–145)
Urea Nitrogen: 14 mg/dL (ref 8–21)

## 2015-09-19 MED ORDER — SILDENAFIL CITRATE 20 MG OR TABS
20.0000 mg | ORAL_TABLET | Freq: Every day | ORAL | 2 refills | Status: DC | PRN
Start: 2015-09-19 — End: 2016-12-17

## 2015-09-19 NOTE — Progress Notes (Signed)
Here for follow up

## 2015-09-19 NOTE — Progress Notes (Signed)
CHIEF COMPLAINT:  Dustin Weaver is a 55 year old male here to discuss cholesterol.      HISTORY OF PRESENT ILLNESS OR INTERVAL HISTORY:  Pt has been working at weight loss.  He cut out fast food and sugar.  He started walking on a treadmill and it started come off quickly.  He would like to get under 190.      He has had some surgeries.      He will see surgeon next week for release.  He had nerve impingement of arm coming from the neck.  He had disc replacement in December.  He continues to have some weakness in the L arm.  When he lifts arms out and tries to lift, he is weak.      Dr. Ennis Forts stated he would not regain strength 100%.      Lifting is hard at home.        Exercising regularly on treadmill.          Review of Systems   Constitutional: Negative.    Musculoskeletal:        L arm weakness   Psychiatric/Behavioral: Negative.        Physical Exam   Constitutional: He is oriented to person, place, and time and well-developed, well-nourished, and in no distress.   HENT:   Head: Normocephalic and atraumatic.   Cardiovascular: Normal rate, regular rhythm and normal heart sounds.  Exam reveals no gallop and no friction rub.    No murmur heard.  Pulmonary/Chest: Effort normal and breath sounds normal.   Abdominal: Soft. Bowel sounds are normal. He exhibits no distension. There is no tenderness. There is no rebound.   Musculoskeletal: Normal range of motion.   Neurological: He is alert and oriented to person, place, and time.   Psychiatric: Mood, memory, affect and judgment normal.       ASSESSMENT AND PLAN:  Htn:  Resolved    Elevated cholesterol  Checking today    ED;  Given sildenafil.    Anxiety and depression:  Resolved.    Colon cancer screening;  Ordered today.    HIV/HEP C:  Ordered today.    OSA:  Resolved.      =======================    PROBLEM LIST:  Reviewed and updated in the electronic medical record under Problem List.    This includes pertinent past medical and surgical history.    Patient Active  Problem List    Diagnosis Date Noted   . GERD (gastroesophageal reflux disease) [K21.9] 11/12/2011   . Generalized anxiety disorder [F41.1] 11/29/2009   . Major depressive disorder, recurrent episode, unspecified ([place]) [F33.9] 11/29/2009     Suicidal thoughts in the past, did not do well with fluoxetine.  Has tolerated sertraline well.       . Essential hypertension, benign [I10] 11/29/2009     Normal echo 01/2010     . Pure hypercholesterolemia [E78.00] 11/29/2009     Did not tolerate statin     . Obstructive sleep apnea syndrome [G47.33] 03/13/2014       MEDICATIONS:  Current Outpatient Prescriptions   Medication Sig Dispense Refill   . Aspirin 81 MG Oral Tab 1 tab daily 30 Tab 0     No current facility-administered medications for this visit.        ALLERGIES:  Review of patient's allergies indicates:  Allergies   Allergen Reactions   . Statins Headache     Per patient every statin he has ever taken gives  him severe headache.   . Niacin

## 2015-09-19 NOTE — Patient Instructions (Signed)
Htn:  Resolved    Elevated cholesterol  Checking today    ED;  Given sildenafil.    Anxiety and depression:  Resolved.    Colon cancer screening;  Ordered today.    HIV/HEP C:  Ordered today.    OSA:  Resolved.

## 2015-09-20 LAB — HIV ANTIGEN AND ANTIBODY SCRN
HIV Antigen and Antibody Interpretation: NONREACTIVE
HIV Antigen and Antibody Result: NONREACTIVE

## 2015-09-21 LAB — HEPATITIS C AB WITH REFLEX PCR: Hepatitis C Antibody w/Rflx PCR: NONREACTIVE

## 2015-09-22 ENCOUNTER — Telehealth (INDEPENDENT_AMBULATORY_CARE_PROVIDER_SITE_OTHER): Payer: Self-pay | Admitting: Internal Medicine

## 2015-09-22 NOTE — Telephone Encounter (Signed)
Received fax from [place] stating that Sildenafil needs a PA.     Called [facility]) and was told that we needed to speak with the specialty # and that number is [phone].    Covermymeds is not working currently and PA office at [facility] is closed until tomorrow.     Will try either phone or web PA tomorrow.

## 2015-10-03 NOTE — Telephone Encounter (Signed)
20mg  tabs are usually covered     Check cash prices on goodrx.com   Consider coupon with manufacture.

## 2015-10-03 NOTE — Telephone Encounter (Signed)
Called patient & he stated that he already picked up Rx [facility] & paid out of pocket for it with the [facility] coupon.    Patient would still like for PA to be done to see if his insurance will cover it.  Fwd to pcp's MA to start PA.

## 2015-10-03 NOTE — Telephone Encounter (Signed)
Patient is in office today asking if PA for medication has been completed.      I advised him that it looks like it has not been initiated quite yet. I also advised him that this medication usually is never covered by insurance so most likely will have to pay out of pocket.     Patient also requesting to get a Hard copy Rx for medication to take over to [facility] since it is cheaper if he gets it filled there.      Advised him that i would check with provider in office today to see if she would be willing to print Hard Copy RX for patient.

## 2015-10-05 ENCOUNTER — Other Ambulatory Visit (INDEPENDENT_AMBULATORY_CARE_PROVIDER_SITE_OTHER): Payer: Self-pay | Admitting: Internal Medicine

## 2015-10-05 DIAGNOSIS — Z1211 Encounter for screening for malignant neoplasm of colon: Secondary | ICD-10-CM

## 2015-10-07 LAB — OCCULT BLOOD BY IA, STL: Occult Bld 1 Result: NEGATIVE

## 2015-10-12 ENCOUNTER — Encounter (INDEPENDENT_AMBULATORY_CARE_PROVIDER_SITE_OTHER): Payer: Self-pay | Admitting: Internal Medicine

## 2015-10-15 ENCOUNTER — Encounter (INDEPENDENT_AMBULATORY_CARE_PROVIDER_SITE_OTHER): Payer: Self-pay | Admitting: Internal Medicine

## 2015-12-26 ENCOUNTER — Encounter (INDEPENDENT_AMBULATORY_CARE_PROVIDER_SITE_OTHER): Payer: Self-pay

## 2016-12-17 ENCOUNTER — Ambulatory Visit (INDEPENDENT_AMBULATORY_CARE_PROVIDER_SITE_OTHER): Payer: BLUE CROSS/BLUE SHIELD | Admitting: Internal Medicine

## 2016-12-17 ENCOUNTER — Encounter (INDEPENDENT_AMBULATORY_CARE_PROVIDER_SITE_OTHER): Payer: Self-pay | Admitting: Internal Medicine

## 2016-12-17 VITALS — BP 125/83 | HR 80 | Temp 98.0°F | Resp 20 | Wt 204.0 lb

## 2016-12-17 DIAGNOSIS — E7800 Pure hypercholesterolemia, unspecified: Secondary | ICD-10-CM

## 2016-12-17 DIAGNOSIS — K219 Gastro-esophageal reflux disease without esophagitis: Secondary | ICD-10-CM | POA: Insufficient documentation

## 2016-12-17 DIAGNOSIS — C4440 Unspecified malignant neoplasm of skin of scalp and neck: Secondary | ICD-10-CM

## 2016-12-17 DIAGNOSIS — M542 Cervicalgia: Secondary | ICD-10-CM

## 2016-12-17 DIAGNOSIS — Z6829 Body mass index (BMI) 29.0-29.9, adult: Secondary | ICD-10-CM

## 2016-12-17 DIAGNOSIS — Z9889 Other specified postprocedural states: Secondary | ICD-10-CM | POA: Insufficient documentation

## 2016-12-17 DIAGNOSIS — Z1211 Encounter for screening for malignant neoplasm of colon: Secondary | ICD-10-CM

## 2016-12-17 DIAGNOSIS — G8929 Other chronic pain: Secondary | ICD-10-CM

## 2016-12-17 DIAGNOSIS — N529 Male erectile dysfunction, unspecified: Secondary | ICD-10-CM

## 2016-12-17 MED ORDER — OMEPRAZOLE 20 MG OR CPDR
20.0000 mg | DELAYED_RELEASE_CAPSULE | Freq: Every day | ORAL | 3 refills | Status: AC
Start: 2016-12-17 — End: ?

## 2016-12-17 MED ORDER — SILDENAFIL CITRATE 20 MG OR TABS
20.0000 mg | ORAL_TABLET | Freq: Every day | ORAL | 2 refills | Status: DC | PRN
Start: 2016-12-17 — End: 2016-12-17

## 2016-12-17 MED ORDER — DICLOFENAC SODIUM 75 MG OR TBEC
75.0000 mg | DELAYED_RELEASE_TABLET | Freq: Two times a day (BID) | ORAL | 11 refills | Status: AC
Start: 2016-12-17 — End: ?

## 2016-12-17 MED ORDER — SILDENAFIL CITRATE 20 MG OR TABS
20.0000 mg | ORAL_TABLET | Freq: Every day | ORAL | 2 refills | Status: AC | PRN
Start: 2016-12-17 — End: ?

## 2016-12-17 NOTE — Patient Instructions (Signed)
GERD:  On omeprazole    ED:  Refilled sildenafil    Back arthritis:  Diclofenac refilled to take as needed.    Elevated cholesterol:  Does not tolerate statin.

## 2016-12-17 NOTE — Progress Notes (Signed)
CHIEF COMPLAINT:  Dustin Weaver is a 56 year old male here to discuss medication review.      HISTORY OF PRESENT ILLNESS OR INTERVAL HISTORY:  Pt had injury to the neck in C6-7, had surgery.  Arm pain was from the nerve injury.  He had neck surgery.  Now has a back injury and uses diclofenac.    Review of Systems   Constitutional: Negative.    Respiratory: Negative.    Cardiovascular: Negative.    Musculoskeletal: Positive for back pain.   Psychiatric/Behavioral: Negative.        Physical Exam   Constitutional: He is oriented to person, place, and time and well-developed, well-nourished, and in no distress.   HENT:   Head: Normocephalic.   Eyes: Conjunctivae are normal. Pupils are equal, round, and reactive to light.   Neck: Normal range of motion. Neck supple. No thyromegaly present.   Cardiovascular: Normal rate, regular rhythm and normal heart sounds.  Exam reveals no gallop and no friction rub.    No murmur heard.  Pulmonary/Chest: Effort normal and breath sounds normal. No respiratory distress. He has no wheezes. He has no rales.   Abdominal: Soft. Bowel sounds are normal.   Musculoskeletal: Normal range of motion.   Lymphadenopathy:     He has no cervical adenopathy.   Neurological: He is alert and oriented to person, place, and time.   Skin: Skin is warm and dry.   Scaly lesion on the top of the scalp   Psychiatric: Mood, memory, affect and judgment normal.       ASSESSMENT AND PLAN:  GERD:  On omeprazole    ED:  Refilled sildenafil    Back arthritis:  Diclofenac refilled to take as needed.    Elevated cholesterol:  Does not tolerate statin.        =======================    PROBLEM LIST:  Reviewed and updated in the electronic medical record under Problem List.    This includes pertinent past medical and surgical history.    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 Ennis Forts at [facility] 03/2015.  Continues to have L arm weakness        history of rupture of bilateral biceps tendon [S46.212A] 09/19/2015     R sided repair 2006  L sided repair 2015         MEDICATIONS:  Current Outpatient Prescriptions   Medication Sig Dispense Refill    Aspirin 81 MG Oral Tab 1 tab daily 30 Tab 0    Diclofenac Sodium 75 MG Oral Tab EC Take 75 mg by mouth.      Omeprazole 20 MG Oral CAPSULE DELAYED RELEASE       Sildenafil Citrate 20 MG Oral Tab Take 1 tablet (20 mg) by mouth daily as needed (erectile dysfunction). 20 tablet 2     No current facility-administered medications for this visit.        ALLERGIES:  Review of patient's allergies indicates:  Allergies   Allergen Reactions    Statins Headache     Per patient every statin he has ever taken gives him severe headache.    Niacin        9:15 AM

## 2016-12-17 NOTE — Progress Notes (Signed)
Reason for visit: Medication Management        HM Due:   Health Maintenance   Topic Date Due    Depression Screening (PHQ-2)  12/06/1972    Colorectal Cancer Screening (FOBT/FIT)  10/02/2016    Influenza Vaccine (1) 01/02/2017    Diabetes Screening  09/19/2018    Lipid Disorders Screening  09/18/2020    Tetanus Vaccine  03/04/2021    Hepatitis C Screening  Completed    HIV Screening  Completed

## 2017-01-07 ENCOUNTER — Ambulatory Visit (INDEPENDENT_AMBULATORY_CARE_PROVIDER_SITE_OTHER): Payer: BLUE CROSS/BLUE SHIELD | Admitting: Internal Medicine

## 2017-01-07 VITALS — BP 133/84 | HR 68 | Temp 98.6°F | Wt 209.0 lb

## 2017-01-07 DIAGNOSIS — Z129 Encounter for screening for malignant neoplasm, site unspecified: Secondary | ICD-10-CM

## 2017-01-07 DIAGNOSIS — L03811 Cellulitis of head [any part, except face]: Secondary | ICD-10-CM

## 2017-01-07 MED ORDER — SULFAMETHOXAZOLE-TRIMETHOPRIM 800-160 MG OR TABS
1.0000 | ORAL_TABLET | Freq: Two times a day (BID) | ORAL | 0 refills | Status: AC
Start: 2017-01-07 — End: ?

## 2017-01-07 NOTE — Patient Instructions (Addendum)
Take the antibiotic    Follow up if worsening or not resolving     Please return the stool card by mail

## 2017-01-07 NOTE — Progress Notes (Signed)
Chief Complaint   Patient presents with    Throat Problem       HPI  Dustin Weaver presents with a couple weeks of progressive left posterior scalp pain and swelling, began after he got a haircut.    Past Medical History:   Diagnosis Date    Allergic rhinitis due to allergen     GERD (gastroesophageal reflux disease)     Lipidemia      Past Surgical History:   Procedure Laterality Date    BL Distal Bicep Repair Bilateral 11/29/2013    C5-7 ACDF   03/19/2015    Done at [facility]       Current Outpatient Prescriptions:     Aspirin 81 MG Oral Tab, 1 tab daily, Disp: 30 Tab, Rfl: 0    Diclofenac Sodium 75 MG Oral Tab EC, Take 1 tablet (75 mg) by mouth 2 times a day., Disp: 60 tablet, Rfl: 11    Omeprazole 20 MG Oral CAPSULE DELAYED RELEASE, Take 1 capsule (20 mg) by mouth daily on an empty stomach., Disp: 90 capsule, Rfl: 3    Sildenafil Citrate 20 MG Oral Tab, Take 1 tablet (20 mg) by mouth daily as needed (erectile dysfunction)., Disp: 20 tablet, Rfl: 2    Social History   Substance Use Topics    Smoking status: Never Smoker    Smokeless tobacco: Never Used    Alcohol use No       ROS  Const; no definite fever      EXAM  BP 133/84    Pulse 68    Temp 98.6 F (37 C) (Temporal)    Wt 209 lb (94.8 kg)    BMI 29.99 kg/m   Gen: healthy, alert, no distress  HEENT:notable for left posterior scalp swelling and erythema, no definite fluctuance    A/P    (L03.811) Cellulitis of scalp  (primary encounter diagnosis)  Plan: Sulfamethoxazole-Trimethoprim (BACTRIM DS)         800-160 MG Oral Tab            (Z12.9) Cancer screening  Plan: encouraged to return the stool card

## 2017-01-07 NOTE — Progress Notes (Signed)
Reason for visit:   Bump in neck     Have you seen a specialist since your last visit: NO          08/09/11 Forecasted, patient up to date, [place] Maintenance   Topic Date Due    Zoster Vaccine (1 of 2) 12/05/2012    Colorectal Cancer Screening (FOBT/FIT)  10/02/2016    Influenza Vaccine (1) 01/02/2017    Depression Screening (PHQ-2)  12/17/2017    Diabetes Screening  09/19/2018    Lipid Disorders Screening  09/18/2020    Tetanus Vaccine  03/04/2021    Hepatitis C Screening  Completed    HIV Screening  Completed       Future Appointments  Date Time Provider [place]   01/07/2017 3:00 PM Lauinger, Unice Cobble, MD [facility]

## 2017-01-10 ENCOUNTER — Encounter (INDEPENDENT_AMBULATORY_CARE_PROVIDER_SITE_OTHER): Payer: Self-pay

## 2017-01-11 ENCOUNTER — Telehealth (INDEPENDENT_AMBULATORY_CARE_PROVIDER_SITE_OTHER): Payer: Self-pay

## 2017-01-11 DIAGNOSIS — L03811 Cellulitis of head [any part, except face]: Secondary | ICD-10-CM

## 2017-01-11 NOTE — Telephone Encounter (Signed)
Received call from patient. He said he had a reaction to the Bactrim right after taking it--loss of appetite and frequent urination. He has stopped taking the bactrim and is asking if there is another medication he can take for his cellulitis of scalp.

## 2017-01-12 MED ORDER — CEPHALEXIN 500 MG OR CAPS
500.0000 mg | ORAL_CAPSULE | Freq: Four times a day (QID) | ORAL | 0 refills | Status: AC
Start: 2017-01-12 — End: ?

## 2017-01-12 NOTE — Telephone Encounter (Signed)
He can try cephalexin  Script faxed, please let him know

## 2017-01-13 NOTE — Telephone Encounter (Signed)
Informed patient new medication was faxed to his pharmacy, cephalexin, and he can try this. Advised to let us know of any side effects with this medication

## 2017-01-14 ENCOUNTER — Encounter (INDEPENDENT_AMBULATORY_CARE_PROVIDER_SITE_OTHER): Payer: Self-pay

## 2017-02-17 ENCOUNTER — Encounter (INDEPENDENT_AMBULATORY_CARE_PROVIDER_SITE_OTHER): Payer: Self-pay

## 2017-03-13 NOTE — Progress Notes (Signed)
Department: Population Health Management  Reason: Health Maintenance    Patient's chart was reviewed by Population Coordinator  for possible colon cancer screening needs on 03/13/17    Findings   1. No previous screening but open referral found - FOBT ordered 12/17/16, status: FUTURE. Expires 08/17/17    Next steps:  1. Specialty comment update

## 2018-01-17 ENCOUNTER — Other Ambulatory Visit: Payer: Self-pay | Admitting: Gastroenterology

## 2018-01-17 DIAGNOSIS — Z1211 Encounter for screening for malignant neoplasm of colon: Secondary | ICD-10-CM

## 2018-02-21 ENCOUNTER — Encounter: Payer: Self-pay | Admitting: Family Medicine

## 2018-02-21 ENCOUNTER — Ambulatory Visit (INDEPENDENT_AMBULATORY_CARE_PROVIDER_SITE_OTHER): Payer: Managed Care, Other (non HMO) | Admitting: Family Medicine

## 2018-02-21 VITALS — BP 156/98 | HR 80 | Temp 98.4°F | Ht 69.0 in | Wt 217.6 lb

## 2018-02-21 DIAGNOSIS — Z8679 Personal history of other diseases of the circulatory system: Secondary | ICD-10-CM | POA: Diagnosis not present

## 2018-02-21 DIAGNOSIS — N529 Male erectile dysfunction, unspecified: Secondary | ICD-10-CM

## 2018-02-21 DIAGNOSIS — M5136 Other intervertebral disc degeneration, lumbar region: Secondary | ICD-10-CM | POA: Insufficient documentation

## 2018-02-21 DIAGNOSIS — K219 Gastro-esophageal reflux disease without esophagitis: Secondary | ICD-10-CM | POA: Insufficient documentation

## 2018-02-21 DIAGNOSIS — Z8042 Family history of malignant neoplasm of prostate: Secondary | ICD-10-CM

## 2018-02-21 DIAGNOSIS — E782 Mixed hyperlipidemia: Secondary | ICD-10-CM

## 2018-02-21 DIAGNOSIS — G8929 Other chronic pain: Secondary | ICD-10-CM | POA: Insufficient documentation

## 2018-02-21 DIAGNOSIS — Z833 Family history of diabetes mellitus: Secondary | ICD-10-CM

## 2018-02-21 DIAGNOSIS — R030 Elevated blood-pressure reading, without diagnosis of hypertension: Secondary | ICD-10-CM

## 2018-02-21 DIAGNOSIS — M545 Low back pain, unspecified: Secondary | ICD-10-CM

## 2018-02-21 DIAGNOSIS — Z7689 Persons encountering health services in other specified circumstances: Secondary | ICD-10-CM | POA: Diagnosis not present

## 2018-02-21 DIAGNOSIS — E669 Obesity, unspecified: Secondary | ICD-10-CM

## 2018-02-21 DIAGNOSIS — M159 Polyosteoarthritis, unspecified: Secondary | ICD-10-CM | POA: Insufficient documentation

## 2018-02-21 DIAGNOSIS — Z789 Other specified health status: Secondary | ICD-10-CM

## 2018-02-21 DIAGNOSIS — M4322 Fusion of spine, cervical region: Secondary | ICD-10-CM | POA: Insufficient documentation

## 2018-02-21 DIAGNOSIS — S46219A Strain of muscle, fascia and tendon of other parts of biceps, unspecified arm, initial encounter: Secondary | ICD-10-CM | POA: Insufficient documentation

## 2018-02-21 DIAGNOSIS — Z8249 Family history of ischemic heart disease and other diseases of the circulatory system: Secondary | ICD-10-CM

## 2018-02-21 DIAGNOSIS — Z83438 Family history of other disorder of lipoprotein metabolism and other lipidemia: Secondary | ICD-10-CM | POA: Insufficient documentation

## 2018-02-21 MED ORDER — OMEPRAZOLE 20 MG PO CPDR: 20.0000 mg | DELAYED_RELEASE_CAPSULE | Freq: Every day | ORAL | 1 refills | Status: DC

## 2018-02-21 NOTE — Progress Notes (Signed)
New patient office visit note:  Impression and Recommendations:    1. Establishing care with new doctor, encounter for   2. Elevated blood-pressure reading without diagnosis of hypertension   3. Personal history of primary hypertension   4. Mixed hyperlipidemia   5. Statin intolerance   6. Gastroesophageal reflux disease, esophagitis presence not specified   7. Obesity, Class I, BMI 30-34.9   8. Generalized OA   9. Chronic bilateral low back pain, unspecified whether sciatica present   10. Degenerative disc disease, lumbar   11. h/o Cervical vertebral fusion   12. Erectile dysfunction, unspecified erectile dysfunction type   13. FHx: early coronary artery disease   14. Family history of combined hyperlipidemia   15. Family history of essential hypertension   16. Family history of prostate cancer in father   3117. Rupture of biceps tendon, unspecified laterality, subsequent encounter     - Extensive discussion held with patient today regarding establishing as a new patient.  Discussed practices here at the clinic, and answered all questions about care team and health management during appointment.  1. GERD - Discussed patient's history of GERD at length during appointment. - Reviewed importance of avoiding trigger foods and not eating within three hours of laying down. - Will continue to monitor.  2. Elevated Blood Pressure - Elevated on intake today. - Reviewed that less than 120/80 is normal BP.  - Lifestyle changes such as dash diet and engaging in a regular exercise program discussed with patient.  Educational handouts provided  - Ambulatory BP monitoring encouraged.  Encouraged patient to purchase a blood pressure monitor, keep log, and bring in next OV.  3. BMI Counseling - BMI of 32.1 Explained to patient what BMI refers to, and what it means medically.    Told patient to think about it as a "medical risk stratification measurement" and how increasing BMI is  associated with increasing risk/ or worsening state of various diseases such as hypertension, hyperlipidemia, diabetes, premature OA, depression etc.  American Heart Association guidelines for healthy diet, basically Mediterranean diet, and exercise guidelines of 30 minutes 5 days per week or more discussed in detail.  Health counseling performed.  All questions answered.  4. Lifestyle & Preventative Health Maintenance - Advised patient to continue working toward exercising to improve overall mental, physical, and emotional health.    - Reviewed the "spokes of the wheel" of mood and health management.  Stressed the importance of ongoing prudent habits, including regular exercise, appropriate sleep hygiene, healthful dietary habits, and prayer/meditation to relax.  - Encouraged patient to engage in daily physical activity, especially a formal exercise routine.  Recommended that the patient eventually strive for at least 150 minutes of moderate cardiovascular activity per week according to guidelines established by the [facility].   - Healthy dietary habits encouraged, including low-carb, and high amounts of lean protein in diet.   - Patient should also consume adequate amounts of water.   Education and routine counseling performed. Handouts provided.  5. Follow-Up - Prescriptions refilled today PRN. - Need for fasting lab work to establish baseline. - Otherwise, continue to return for CPE and chronic follow-up as scheduled.   - Patient knows to call in sooner if desired to address acute concerns.   Do not remove these below:  Orders Placed This Encounter  Procedures  . CBC with Differential/Platelet  . Comprehensive metabolic panel  . Hemoglobin A1c  . Lipid panel  . T4, free  . TSH  .  VITAMIN D 25 Hydroxy (Vit-D Deficiency, Fractures)    Meds ordered this encounter  Medications  . omeprazole (PRILOSEC) 20 MG capsule    Sig: Take 1 capsule (20 mg total) by mouth daily.    Dispense:   90 capsule    Refill:  1    Medications Discontinued During This Encounter  Medication Reason  . omeprazole (PRILOSEC) 20 MG capsule Reorder      Gross side effects, risk and benefits, and alternatives of medications discussed with patient.  Patient is aware that all medications have potential side effects and we are unable to predict every side effect or drug-drug interaction that may occur.  Expresses verbal understanding and consents to current therapy plan and treatment regimen.  Return for Will need follow-up in 1 month if BP too high; or 10mo- CPE.  Please see AVS handed out to patient at the end of our visit for further patient instructions/ counseling done pertaining to today's office visit.    Note:  This document was prepared using Dragon voice recognition software and may include unintentional dictation errors.   This document serves as a record of services personally performed by Thomasene Lot, DO. It was created on her behalf by Peggye Fothergill, a trained medical scribe. The creation of this record is based on the scribe's personal observations and the provider's statements to them.   I have reviewed the above medical documentation for accuracy and completeness and I concur.  Thomasene Lot, DO, D.O. 02/21/2018 11:55 AM       -------------------------------------------------------------------------------------------------------------    Subjective:    Chief complaint:   Chief Complaint  Patient presents with  . Establish Care    HPI: Danny Ayers is a 57 y.o. male who presents to [facility] today to review their medical history with me and establish care.   I asked the patient to review their chronic problem list with me to ensure everything was updated and accurate, especially since patient did not have any of his completed paperwork done prior to OV today.   Paperwork was filled out by medical assistant and patient failed  to divulge much information to her.    All recent office visits with other providers, any medical records that patient brought in etc  - I reviewed today.     We asked pt to get [place] their medical records from [facility] providers/ specialists that they had seen within the past 3-5 years-he is recently moved from [facility] and states he had a Art therapist out there but no other specialists.  -Per patient he never had a colonoscopy.  Per pt, back in 2006 he did however have an EGD here in [place] for evaluation of his chronic reflux.  Also per patient he never had a rectal exam\prostate exam even though he has strong family history of prostate cancer of several family members in their early 52s.  Per patient prior PCP just "got blood work on me for lots of reasons".   Reason for Establishing Care Recently moved from [place], about a year ago-this is when he got around to establishing care  Has been living in the area since the week of Thanksgiving 2018.  Notes now they are "finally settled in."   Social History He grew up near [place], between [place] and [place]. Has relatives in [place] area and [place], [place]. Went to [place] state while he was in [employer]. Stayed up there for a while until he moved  here.  Wife's name is Junious Dresser, also establishing today. Married 20 years; monogamous. Two dogs, pit bull mix and one other.  He has a daughter in her 42's.  But he does not know anything about her or if she has a child etc.   On his feet at work a lot.  States no formal exercise but that is his exercise.    Tobacco Use Never smoker  EtOH Use Never drinker  No drug use.  Exercise Habits In the past he had exercised on the treadmill regularly -which led him to lose weight and come off his blood pressure medicines, improve his cholesterol etc.  However he has gained the weight back-at least 20 pounds since moving and not using the treadmill for the past  year.   Was pushing 235 lbs in the past.  Was told to eat better and exercise.   Family History Dad diagnosed with prostate cancer at age 54.  Living. 3 uncles with prostate cancer, one dead. One uncle was diagnosed in his 56's.  Brother has diabetes; is age 95 and has had it for a while. Diagnosed in late 34's- 50's. Paternal grandmother with diabetes, and others per patient.  Dad's side with heart attacks. Patient's dad had heart attack in his early to mid 44s; his sister had strokes.   Past Medical History Has not established with specialties here. Has never been to a dermatologist. Has never had a colonoscopy. Has never had a prostate exam despite his family history  - Elevated Blood Pressure States a history of high blood pressure when he was overweight, but his BP has evened out since he lost weight.  Was taken off of meds when he lost weight and it improved.  In the past, he took lisinopril, did well on it at the time.  - Hyperlipidemia w/Intolerance to Statins High cholesterol, but cannot take statin because it causes extreme migraines.  Tried all of them, got bad headaches on all of them.  No other history of migraines.  Has had high cholesterol for 25+ years.  Controls it with diet, exercise, fish oils.  Niacin doesn't work; experienced something "worse than hot flashes."  Woke up in the middle of the night "on fire."  - GERD Has had acid reflux for years.  Tried Pepcid, ranitidine, Tums, etc.  Notes "it depends on what I eat."  "I don't have it until I eat a certain thing."  Patient states "I either eat or I don't eat."  Patient uses prescription for acid reflux; Pepcid doesn't work. Takes them as needed.  - Injuries Two plates in his neck.  Two bicep tendon repairs, one in each arm.  Had nerve pain in his arm; ended up having his neck checked.  Took two discs out of his neck, "cadaver bone in, metal plates."  Thinks it was C6 and C7 but can't be  sure.  Lower back, two blown discs.  Notes that the doctor that did his neck said 'we aren't going to do anything with that."  Notes "lots of arthritis in his back."  Takes some Voltaren once every two months when his back starts acting up.  Feels that his bladder continence is starting to weaken.  Thinks he has a pinched nerve in his lower back with the back injury.  Feels that his injuries have something to do with his time in the [employer].  Notes "these are all work-related injuries."  - Erectile Dysfunction Takes sildenafil to treat.  Feels he's  had this issue since he injured his back.    Wt Readings from Last 3 Encounters:  02/21/18 217 lb 9.6 oz (98.7 kg)   BP Readings from Last 3 Encounters:  02/21/18 (!) 156/98   Pulse Readings from Last 3 Encounters:  02/21/18 80   BMI Readings from Last 3 Encounters:  02/21/18 32.13 kg/m    Patient Care Team    Relationship Specialty Notifications Start End  Thomasene Lot, DO PCP - General Family Medicine  02/21/18     Patient Active Problem List   Diagnosis Date Noted  . Elevated blood-pressure reading without diagnosis of hypertension 02/21/2018    Priority: High  . Mixed hyperlipidemia 02/21/2018    Priority: High  . Obesity, Class I, BMI 30-34.9 02/21/2018    Priority: High  . FHx: early coronary artery disease- Dad in 70's, sev others 02/21/2018    Priority: High  . Personal history of primary hypertension 02/21/2018    Priority: High  . Statin intolerance- "severe HA's to all statins/ chol meds" 02/21/2018    Priority: Medium  . Family history of prostate cancer in father 02/21/2018    Priority: Medium  . Gastroesophageal reflux disease 02/21/2018    Priority: Low  . Erectile dysfunction 02/21/2018    Priority: Low  . Chronic bilateral low back pain 02/21/2018    Priority: Low  . 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/2018  . Degenerative disc disease, lumbar 02/21/2018  . h/o Cervical vertebral fusion 02/21/2018  . Family  history of combined hyperlipidemia 02/21/2018  . Family history of essential hypertension 02/21/2018  . h/o B/L Biceps tendon rupture with surgical repair B/L 02/21/2018     As reported by pt:  Past Medical History:  Diagnosis Date  . High cholesterol      Past Surgical History:  Procedure Laterality Date  . BICEPS TENDON REPAIR Bilateral   . NECK SURGERY       Family History  Problem Relation Age of Onset  . Prostate cancer Father   . Coronary artery disease Father   . Diabetes Brother   . Prostate cancer Paternal Uncle   . Diabetes Paternal Grandmother   . Stroke Paternal Aunt      Social History   Substance and Sexual Activity  Drug Use Never     Social History   Substance and Sexual Activity  Alcohol Use Never  . Frequency: Never     Social History   Tobacco Use  Smoking Status Never Smoker  Smokeless Tobacco Never Used     Current Meds  Medication Sig  . diclofenac (VOLTAREN) 75 MG EC tablet Take 75 mg by mouth 2 (two) times daily.  Marland Kitchen omeprazole (PRILOSEC) 20 MG capsule Take 1 capsule (20 mg total) by mouth daily.  . [DISCONTINUED] omeprazole (PRILOSEC) 20 MG capsule Take 20 mg by mouth daily.    Allergies: Niacin and related and Statins   Review of Systems  Constitutional: Negative for chills, diaphoresis, fever, malaise/fatigue and weight loss.  HENT: Negative for congestion, sore throat and tinnitus.   Eyes: Negative for blurred vision, double vision and photophobia.  Respiratory: Negative for cough and wheezing.   Cardiovascular: Negative for chest pain and palpitations.  Gastrointestinal: Negative for blood in stool, diarrhea, nausea and vomiting.  Genitourinary: Negative for dysuria, frequency and urgency.  Musculoskeletal: Negative for joint pain and myalgias.  Skin: Negative for itching and rash.  Neurological: Negative for dizziness, focal weakness, weakness and headaches.  Endo/Heme/Allergies: Negative for environmental  allergies and polydipsia.  Does not bruise/bleed easily.  Psychiatric/Behavioral: Negative for depression and memory loss. The patient is not nervous/anxious and does not have insomnia.       Objective:   Blood pressure (!) 156/98, pulse 80, temperature 98.4 F (36.9 C), height 5\' 9"  (1.753 m), weight 217 lb 9.6 oz (98.7 kg), SpO2 99 %. Body mass index is 32.13 kg/m. General: Well Developed, well nourished, and appears acutely distressed- anxious to leave office and get to work.  Neuro: Alert and oriented x3, extra-ocular muscles intact, sensation grossly intact.  HEENT:[place]/AT, PERRLA, neck supple, No carotid bruits Skin: no gross rashes  Cardiac: Regular rate and rhythm Respiratory: Essentially clear to auscultation bilaterally. Not using accessory muscles, speaking in full sentences.  Abdominal: not grossly distended Musculoskeletal: Ambulates w/o diff, FROM * 4 ext.  Vasc: less 2 sec cap RF, warm and pink  Psych:  No HI/SI, judgement and insight good, agitated mood    No results found for this or any previous visit (from the past 2160 hour(s)).

## 2018-02-21 NOTE — Patient Instructions (Signed)
Your blood pressure is elevated you will need to check it at home and if it is not at goal of less then 130/80 or less on a regular basis, I highly recommend you follow-up sooner than planned-approximately 1 month from now so we can address and put you on medications.  This is something I will need to see you for in the office.  Your goal blood pressure should be 130/80 or less on a regular basis, her medications should be started.  Normal blood pressure is 120/80 or less.    Hypertension Hypertension, commonly called high blood pressure, is when the force of blood pumping through the arteries is too strong. The arteries are the blood vessels that carry blood from the heart throughout the body. Hypertension forces the heart to work harder to pump blood and may cause arteries to become narrow or stiff. Having untreated or uncontrolled hypertension can cause heart attacks, strokes, kidney disease, and other problems. A blood pressure reading consists of a higher number over a lower number. Ideally, your blood pressure should be below 120/80. The first ("top") number is called the systolic pressure. It is a measure of the pressure in your arteries as your heart beats. The second ("bottom") number is called the diastolic pressure. It is a measure of the pressure in your arteries as the heart relaxes. What are the causes? The cause of this condition is not known. What increases the risk? Some risk factors for high blood pressure are under your control. Others are not. Factors you can change  Smoking.  Having type 2 diabetes mellitus, high cholesterol, or both.  Not getting enough exercise or physical activity.  Being overweight.  Having too much fat, sugar, calories, or salt (sodium) in your diet.  Drinking too much alcohol. Factors that are difficult or impossible to change  Having chronic kidney disease.  Having a family history of high blood pressure.  Age. Risk increases with  age.  Race. You may be at higher risk if you are African-American.  Gender. Men are at higher risk than women before age 62. After age 62, women are at higher risk than men.  Having obstructive sleep apnea.  Stress. What are the signs or symptoms? Extremely high blood pressure (hypertensive crisis) may cause:  Headache.  Anxiety.  Shortness of breath.  Nosebleed.  Nausea and vomiting.  Severe chest pain.  Jerky movements you cannot control (seizures).  How is this diagnosed? This condition is diagnosed by measuring your blood pressure while you are seated, with your arm resting on a surface. The cuff of the blood pressure monitor will be placed directly against the skin of your upper arm at the level of your heart. It should be measured at least twice using the same arm. Certain conditions can cause a difference in blood pressure between your right and left arms. Certain factors can cause blood pressure readings to be lower or higher than normal (elevated) for a short period of time:  When your blood pressure is higher when you are in a health care provider's office than when you are at home, this is called white coat hypertension. Most people with this condition do not need medicines.  When your blood pressure is higher at home than when you are in a health care provider's office, this is called masked hypertension. Most people with this condition may need medicines to control blood pressure.  If you have a high blood pressure reading during one visit or you have normal  blood pressure with other risk factors:  You may be asked to return on a different day to have your blood pressure checked again.  You may be asked to monitor your blood pressure at home for 1 week or longer.  If you are diagnosed with hypertension, you may have other blood or imaging tests to help your health care provider understand your overall risk for other conditions. How is this treated? This  condition is treated by making healthy lifestyle changes, such as eating healthy foods, exercising more, and reducing your alcohol intake. Your health care provider may prescribe medicine if lifestyle changes are not enough to get your blood pressure under control, and if:  Your systolic blood pressure is above 130.  Your diastolic blood pressure is above 80.  Your personal target blood pressure may vary depending on your medical conditions, your age, and other factors. Follow these instructions at home: Eating and drinking  Eat a diet that is high in fiber and potassium, and low in sodium, added sugar, and fat. An example eating plan is called the DASH (Dietary Approaches to Stop Hypertension) diet. To eat this way: ? Eat plenty of fresh fruits and vegetables. Try to fill half of your plate at each meal with fruits and vegetables. ? Eat whole grains, such as whole wheat pasta, brown rice, or whole grain bread. Fill about one quarter of your plate with whole grains. ? Eat or drink low-fat dairy products, such as skim milk or low-fat yogurt. ? Avoid fatty cuts of meat, processed or cured meats, and poultry with skin. Fill about one quarter of your plate with lean proteins, such as fish, chicken without skin, beans, eggs, and tofu. ? Avoid premade and processed foods. These tend to be higher in sodium, added sugar, and fat.  Reduce your daily sodium intake. Most people with hypertension should eat less than 1,500 mg of sodium a day.  Limit alcohol intake to no more than 1 drink a day for nonpregnant women and 2 drinks a day for men. One drink equals 12 oz of beer, 5 oz of wine, or 1 oz of hard liquor. Lifestyle  Work with your health care provider to maintain a healthy body weight or to lose weight. Ask what an ideal weight is for you.  Get at least 30 minutes of exercise that causes your heart to beat faster (aerobic exercise) most days of the week. Activities may include walking, swimming,  or biking.  Include exercise to strengthen your muscles (resistance exercise), such as pilates or lifting weights, as part of your weekly exercise routine. Try to do these types of exercises for 30 minutes at least 3 days a week.  Do not use any products that contain nicotine or tobacco, such as cigarettes and e-cigarettes. If you need help quitting, ask your health care provider.  Monitor your blood pressure at home as told by your health care provider.  Keep all follow-up visits as told by your health care provider. This is important. Medicines  Take over-the-counter and prescription medicines only as told by your health care provider. Follow directions carefully. Blood pressure medicines must be taken as prescribed.  Do not skip doses of blood pressure medicine. Doing this puts you at risk for problems and can make the medicine less effective.  Ask your health care provider about side effects or reactions to medicines that you should watch for. Contact a health care provider if:  You think you are having a reaction to a  medicine you are taking.  You have headaches that keep coming back (recurring).  You feel dizzy.  You have swelling in your ankles.  You have trouble with your vision. Get help right away if:  You develop a severe headache or confusion.  You have unusual weakness or numbness.  You feel faint.  You have severe pain in your chest or abdomen.  You vomit repeatedly.  You have trouble breathing. Summary  Hypertension is when the force of blood pumping through your arteries is too strong. If this condition is not controlled, it may put you at risk for serious complications.  Your personal target blood pressure may vary depending on your medical conditions, your age, and other factors. For most people, a normal blood pressure is less than 120/80.  Hypertension is treated with lifestyle changes, medicines, or a combination of both. Lifestyle changes include  weight loss, eating a healthy, low-sodium diet, exercising more, and limiting alcohol. This information is not intended to replace advice given to you by your health care provider. Make sure you discuss any questions you have with your health care provider. Document Released: 03/21/2005 Document Revised: 02/17/2016 Document Reviewed: 02/17/2016 Elsevier Interactive Patient Education  2018 [employer].    How to Take Your Blood Pressure   Blood pressure is a measurement of how strongly your blood is pressing against the walls of your arteries. Arteries are blood vessels that carry blood from your heart throughout your body. Your health care provider takes your blood pressure at each office visit. You can also take your own blood pressure at home with a blood pressure machine. You may need to take your own blood pressure:  To confirm a diagnosis of high blood pressure (hypertension).  To monitor your blood pressure over time.  To make sure your blood pressure medicine is working.  Supplies needed: To take your blood pressure, you will need a blood pressure machine. You can buy a blood pressure machine, or blood pressure monitor, at most drugstores or online. There are several types of home blood pressure monitors. When choosing one, consider the following:  Choose a monitor that has an arm cuff.  Choose a monitor that wraps snugly around your upper arm. You should be able to fit only one finger between your arm and the cuff.  Do not choose a monitor that measures your blood pressure from your wrist or finger.  Your health care provider can suggest a reliable monitor that will meet your needs. How to prepare To get the most accurate reading, avoid the following for 30 minutes before you check your blood pressure:  Drinking caffeine.  Drinking alcohol.  Eating.  Smoking.  Exercising.  Five minutes before you check your blood pressure:  Empty your bladder.  Sit quietly  without talking in a dining chair, rather than in a soft couch or armchair.  How to take your blood pressure To check your blood pressure, follow the instructions in the manual that came with your blood pressure monitor. If you have a digital blood pressure monitor, the instructions may be as follows: 1. Sit up straight. 2. Place your feet on the floor. Do not cross your ankles or legs. 3. Rest your left arm at the level of your heart on a table or desk or on the arm of a chair. 4. Pull up your shirt sleeve. 5. Wrap the blood pressure cuff around the upper part of your left arm, 1 inch (2.5 cm) above your elbow. It is  best to wrap the cuff around bare skin. 6. Fit the cuff snugly around your arm. You should be able to place only one finger between the cuff and your arm. 7. Position the cord inside the groove of your elbow. 8. Press the power button. 9. Sit quietly while the cuff inflates and deflates. 10. Read the digital reading on the monitor screen and write it down (record it). 11. Wait 2-3 minutes, then repeat the steps, starting at step 1.  What does my blood pressure reading mean? A blood pressure reading consists of a higher number over a lower number. Ideally, your blood pressure should be below 120/80. The first ("top") number is called the systolic pressure. It is a measure of the pressure in your arteries as your heart beats. The second ("bottom") number is called the diastolic pressure. It is a measure of the pressure in your arteries as the heart relaxes. Blood pressure is classified into four stages. The following are the stages for adults who do not have a short-term serious illness or a chronic condition. Systolic pressure and diastolic pressure are measured in a unit called mm Hg. Normal  Systolic pressure: below 120.  Diastolic pressure: below 80. Elevated  Systolic pressure: 120-129.  Diastolic pressure: below 80. Hypertension stage 1  Systolic pressure:  130-139.  Diastolic pressure: 80-89. Hypertension stage 2  Systolic pressure: 140 or above.  Diastolic pressure: 90 or above. You can have prehypertension or hypertension even if only the systolic or only the diastolic number in your reading is higher than normal. Follow these instructions at home:  Check your blood pressure as often as recommended by your health care provider.  Take your monitor to the next appointment with your health care provider to make sure: ? That you are using it correctly. ? That it provides accurate readings.  Be sure you understand what your goal blood pressure numbers are.  Tell your health care provider if you are having any side effects from blood pressure medicine. Contact a health care provider if:  Your blood pressure is consistently high. Get help right away if:  Your systolic blood pressure is higher than 180.  Your diastolic blood pressure is higher than 110. This information is not intended to replace advice given to you by your health care provider. Make sure you discuss any questions you have with your health care provider. Document Released: 08/28/2015 Document Revised: 11/10/2015 Document Reviewed: 08/28/2015 Elsevier Interactive Patient Education  [employer]2018 [employer]

## 2018-02-22 LAB — CBC WITH DIFFERENTIAL/PLATELET
Basophils Absolute: 0.1 10*3/uL (ref 0.0–0.2)
Basos: 1 %
EOS (ABSOLUTE): 0.3 10*3/uL (ref 0.0–0.4)
Eos: 5 %
Hematocrit: 46.5 % (ref 37.5–51.0)
Hemoglobin: 16.6 g/dL (ref 13.0–17.7)
IMMATURE GRANULOCYTES: 1 %
Immature Grans (Abs): 0 10*3/uL (ref 0.0–0.1)
Lymphocytes Absolute: 1.4 10*3/uL (ref 0.7–3.1)
Lymphs: 24 %
MCH: 32.2 pg (ref 26.6–33.0)
MCHC: 35.7 g/dL (ref 31.5–35.7)
MCV: 90 fL (ref 79–97)
MONOS ABS: 0.5 10*3/uL (ref 0.1–0.9)
Monocytes: 8 %
Neutrophils Absolute: 3.6 10*3/uL (ref 1.4–7.0)
Neutrophils: 61 %
PLATELETS: 258 10*3/uL (ref 150–450)
RBC: 5.16 x10E6/uL (ref 4.14–5.80)
RDW: 12.5 % (ref 12.3–15.4)
WBC: 5.8 10*3/uL (ref 3.4–10.8)

## 2018-02-22 LAB — T4, FREE: FREE T4: 0.97 ng/dL (ref 0.82–1.77)

## 2018-02-22 LAB — COMPREHENSIVE METABOLIC PANEL
A/G RATIO: 2.4 — AB (ref 1.2–2.2)
ALK PHOS: 65 IU/L (ref 39–117)
ALT: 48 IU/L — AB (ref 0–44)
AST: 31 IU/L (ref 0–40)
Albumin: 4.8 g/dL (ref 3.5–5.5)
BUN/Creatinine Ratio: 9 (ref 9–20)
BUN: 11 mg/dL (ref 6–24)
Bilirubin Total: 0.5 mg/dL (ref 0.0–1.2)
CHLORIDE: 106 mmol/L (ref 96–106)
CO2: 20 mmol/L (ref 20–29)
CREATININE: 1.16 mg/dL (ref 0.76–1.27)
Calcium: 9.7 mg/dL (ref 8.7–10.2)
GFR calc Af Amer: 81 mL/min/{1.73_m2} (ref >59)
GFR, EST NON AFRICAN AMERICAN: 70 mL/min/{1.73_m2} (ref >59)
Globulin, Total: 2 g/dL (ref 1.5–4.5)
Glucose: 107 mg/dL — ABNORMAL HIGH (ref 65–99)
POTASSIUM: 4.8 mmol/L (ref 3.5–5.2)
Sodium: 142 mmol/L (ref 134–144)
Total Protein: 6.8 g/dL (ref 6.0–8.5)

## 2018-02-22 LAB — LIPID PANEL
CHOL/HDL RATIO: 7.5 ratio — AB (ref 0.0–5.0)
Cholesterol, Total: 246 mg/dL — ABNORMAL HIGH (ref 100–199)
HDL: 33 mg/dL — ABNORMAL LOW (ref >39)
LDL Calculated: 167 mg/dL — ABNORMAL HIGH (ref 0–99)
Triglycerides: 229 mg/dL — ABNORMAL HIGH (ref 0–149)
VLDL Cholesterol Cal: 46 mg/dL — ABNORMAL HIGH (ref 5–40)

## 2018-02-22 LAB — VITAMIN D 25 HYDROXY (VIT D DEFICIENCY, FRACTURES): Vit D, 25-Hydroxy: 18.9 ng/mL — ABNORMAL LOW (ref 30.0–100.0)

## 2018-02-22 LAB — HEMOGLOBIN A1C
ESTIMATED AVERAGE GLUCOSE: 123 mg/dL
Hgb A1c MFr Bld: 5.9 % — ABNORMAL HIGH (ref 4.8–5.6)

## 2018-02-22 LAB — TSH: TSH: 2.8 u[IU]/mL (ref 0.450–4.500)

## 2018-03-15 ENCOUNTER — Telehealth: Payer: Self-pay | Admitting: Family Medicine

## 2018-03-15 NOTE — Telephone Encounter (Signed)
Patient is requesting a call back from nurse about labs and can be reached at [phone]

## 2018-03-16 NOTE — Telephone Encounter (Signed)
Called patient and left message for pt to call back. MPulliam, CMA/RT(R)

## 2018-11-27 ENCOUNTER — Ambulatory Visit: Payer: Managed Care, Other (non HMO) | Admitting: Family Medicine

## 2018-11-27 ENCOUNTER — Encounter: Payer: Self-pay | Admitting: Family Medicine

## 2018-11-27 ENCOUNTER — Other Ambulatory Visit: Payer: Self-pay

## 2018-11-27 VITALS — BP 140/88 | HR 73 | Ht 70.0 in | Wt 222.0 lb

## 2018-11-27 DIAGNOSIS — Z83438 Family history of other disorder of lipoprotein metabolism and other lipidemia: Secondary | ICD-10-CM

## 2018-11-27 DIAGNOSIS — R7303 Prediabetes: Secondary | ICD-10-CM

## 2018-11-27 DIAGNOSIS — Z789 Other specified health status: Secondary | ICD-10-CM

## 2018-11-27 DIAGNOSIS — E782 Mixed hyperlipidemia: Secondary | ICD-10-CM | POA: Diagnosis not present

## 2018-11-27 DIAGNOSIS — R030 Elevated blood-pressure reading, without diagnosis of hypertension: Secondary | ICD-10-CM | POA: Diagnosis not present

## 2018-11-27 DIAGNOSIS — M79605 Pain in left leg: Secondary | ICD-10-CM

## 2018-11-27 DIAGNOSIS — Z8249 Family history of ischemic heart disease and other diseases of the circulatory system: Secondary | ICD-10-CM

## 2018-11-27 DIAGNOSIS — E559 Vitamin D deficiency, unspecified: Secondary | ICD-10-CM

## 2018-11-27 DIAGNOSIS — Z833 Family history of diabetes mellitus: Secondary | ICD-10-CM

## 2018-11-27 MED ORDER — VITAMIN D (ERGOCALCIFEROL) 1.25 MG (50000 UNIT) PO CAPS: ORAL_CAPSULE | ORAL | 3 refills | Status: DC

## 2018-11-27 MED ORDER — ROSUVASTATIN CALCIUM 5 MG PO TABS: ORAL_TABLET | ORAL | 0 refills | Status: DC

## 2018-11-27 NOTE — Progress Notes (Signed)
Assessment and plan:  1. Elevated blood-pressure reading without diagnosis of hypertension   2. Prediabetes   3. Mixed hyperlipidemia   4. h/o Statin intolerance   5. Vitamin D deficiency   6. FHx: early coronary artery disease- Dad in 72's, sev others   7. Family history of diabetes mellitus in brother, father/ other relatives   8. Family history of combined hyperlipidemia   9. Family history of essential hypertension   10. Pain of left lower extremity     History of Left Leg Injury one year ago, Pain of LLE - Per patient, pain sx are now interfering with his quality of life. - Advised patient of critical need to be evaluated by specialist such as sports medicine. - Need for referral extensively discussed today.  - Ambulatory referral provided today to Sports Medicine. - Discontinue use of ibuprofen and diclofenac.  Elevated blood-pressure reading without diagnosis of hypertension - STRONGLY advised patient to begin checking his blood pressure at home. - Extensively discussed importance of ambulatory blood pressure monitoring. - Education provided regarding risks and health effects of chronic high blood pressure.  - Advised patient to check BP at home 2-3 times per week.  Keep log and bring in next OV  - Lifestyle changes such as dash diet and engaging in a regular exercise program discussed with patient.  Educational handouts provided  - Medications discussed and emphasized with patient today. - Patient declines meds at this time. - Re-check in 4 months.  - Will continue to monitor.  Lab Review - Reviewed lab work (02/21/2018) in depth with patient today.  All lab work within normal limits unless otherwise noted.  Extensive education provided and all questions were answered.  - Reviewed preservation of organ health with patient today, including liver and kidneys.  Prediabetes - A1c 5.9 Last Check -  Discussed need for prudent habits to prevent diabetes diagnosis. - Discussed need to exercise and consume a low carb diet.  Mixed hyperlipidemia, Statin intolerance - Triglycerides = 229, elevated. - HDL = 33, low. - LDL = 167, elevated.  - Per pt, in the past, taking statins caused headaches.  - Discussed need to properly hydrate while managed on statin medications. - Advised patient that dehydration and lack of physical activity exacerbates incidence of side-effects.  - Patient agrees to begin low-dose cholesterol medications today.  See med list. - Begin baby aspirin today.  - Ongoing prudent dietary changes such as low saturated & trans fat and low carb diets discussed with patient.  Encouraged regular exercise and weight loss when appropriate.   The 10-year ASCVD risk score Denman George DC Montez Hageman., et al., 2013) is: 12.6%   Values used to calculate the score:     Age: 58 years     Sex: Male     Is Non-Hispanic African American: No     Diabetic: No     Tobacco smoker: No     Systolic Blood Pressure: 146 mmHg     Is BP treated: No     HDL Cholesterol: 33 mg/dL     Total Cholesterol: 246 mg/dL  - Will continue to monitor and re-check in 4 months as recommended.  Vitamin D deficiency  - 18.9 last check - Recommended once-weekly vitamin D prescription, or daily OTC. - Begin supplementation as prescribed.  See med list. - Will continue to monitor and re-check as recommended.  BMI Counseling - Body mass index is 31.85 kg/m Explained to patient  what BMI refers to, and what it means medically.    Told patient to think about it as a "medical risk stratification measurement" and how increasing BMI is associated with increasing risk/ or worsening state of various diseases such as hypertension, hyperlipidemia, diabetes, premature OA, depression etc.  American Heart Association guidelines for healthy diet, basically Mediterranean diet, and exercise guidelines of 30 minutes 5 days per week or  more discussed in detail.  Health counseling performed.  All questions answered.  Lifestyle & Preventative Health Maintenance - Advised patient to continue working toward daily exercise to improve overall mental, physical, and emotional health.  Encouraged patient to engage in a formal exercise routine.  Recommended that the patient eventually strive for at least 150 minutes of moderate cardiovascular activity per week according to guidelines established by the [facility].   - Reviewed the "spokes of the wheel" of mood and health management.  Stressed the importance of ongoing prudent habits, including regular exercise, appropriate sleep hygiene, healthful dietary habits, and prayer/meditation to relax.  - Healthy dietary habits repeatedly encouraged, including low-carb, and high amounts of lean protein in diet.   - Patient should also consume adequate amounts of water.   Education and routine counseling performed. Handouts provided.  Recommendations - Return in 8 weeks for blood pressure, liver enzymes, and statin tolerance.   Orders Placed This Encounter  Procedures   ALT   Ambulatory referral to Sports Medicine    Meds ordered this encounter  Medications   rosuvastatin (CRESTOR) 5 MG tablet    Sig: 1/2 tab qhs QOD    Dispense:  45 tablet    Refill:  0   Vitamin D, Ergocalciferol, (DRISDOL) 1.25 MG (50000 UT) CAPS capsule    Sig: Take one tablet wkly    Dispense:  12 capsule    Refill:  3     Return in about 8 weeks (around 01/22/2019) for reck home BP's, needs alt.   Anticipatory guidance and routine counseling done re: condition, txmnt options and need for follow up. All questions of patient's were answered.   Gross side effects, risk and benefits, and alternatives of medications discussed with patient.  Patient is aware that all medications have potential side effects and we are unable to predict every sideeffect or drug-drug interaction that may occur.  Expresses verbal  understanding and consents to current therapy plan and treatment regiment.  Please see AVS handed out to patient at the end of our visit for additional patient instructions/ counseling done pertaining to today's office visit.  Note:  This document was prepared using Dragon voice recognition software and may include unintentional dictation errors.  This document serves as a record of services personally performed by Mellody Dance, DO. It was created on her behalf by Toni Amend, a trained medical scribe. The creation of this record is based on the scribe's personal observations and the provider's statements to them.   I have reviewed the above medical documentation for accuracy and completeness and I concur.  Mellody Dance, DO 11/28/2018 1:07 PM      ----------------------------------------------------------------------------------------------------------------------  Subjective:   CC:   Danny Ayers is a 58 y.o. male who presents to [facility] today for review and discussion of recent bloodwork that was done in addition to f/up on chronic conditions we are managing for pt.  1. All recent blood work that we ordered was reviewed with patient today.  Patient was counseled on all abnormalities and we discussed dietary  and lifestyle changes that could help those values (also medications when appropriate).  Extensive health counseling performed and all patient's concerns/ questions were addressed.  See labs below and also plan for more details of these abnormalities  Patient was supposed to follow up in December of 2019.  It is August of 2020. Says "when it comes to all this, there's gotta be a good reason for me to come in."  Does not take a multivitamin at home.  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  Patient does not know what medicine he was on in the past for blood pressure.  Says "I've always been borderline thing with my blood pressure."  This medication was discontinued when his weight was more controlled.  Weight Says his weight used to be more under control, but is starting to go back up and he thinks this is causing his BP to go back up.  Also states when he exercises more, he's in more pain.  States "it's a vicious cycle that I have to [employer]  States he "does not agree with the BMI table because you have to be anorexic to be normal."  Leg Pain States he is here for leg pain as his "main thing."  He injured his leg "a year or so ago," building a flatbed trailer for his tractor.  Notes leaning over the deck, putting screws in, and had his knees hyperextended, and when he went to stand up "the left leg popped and it was a major pop, twice."  When this happened, he "felt the muscle or the tendon pull up into my butt cheek and one of them fell into the bottom of my leg."  He has not seen anybody for follow-up for this injury.  Says he does not want to have surgery because it would "lay me up too long and I just can't afford all of that."  Up until a month ago notes he was "doing fine and didn't have any pain" and now it's "hasn't stopped hurting since."  Says "it's more in my butt cheek and my butt muscle hurts."  Is taking medicine that he had remaining from his "bicep surgery" 15 years ago.  Notes "I've been taking that but I wonder if it's going to screw me up on my liver and stuff like that."  Per patient, has been taking these pain pills for "a long while" for arthritis in his back and other concerns.    Wt Readings from Last 3 Encounters:  11/27/18 222 lb (100.7 kg)  02/21/18 217 lb 9.6 oz  (98.7 kg)   BP Readings from Last 3 Encounters:  11/27/18 140/88  02/21/18 (!) 156/98   Pulse Readings from Last 3 Encounters:  11/27/18 73  02/21/18 80   BMI Readings from Last 3 Encounters:  11/27/18 31.85 kg/m  02/21/18 32.13 kg/m     Patient Care Team    Relationship Specialty Notifications Start End  Thomasene Lotpalski, Saba Neuman, DO PCP - General Family Medicine  02/21/18     Full medical history updated and reviewed in the office today  Patient Active Problem List   Diagnosis Date Noted  Elevated blood-pressure reading without diagnosis of hypertension 02/21/2018    Priority: High   Mixed hyperlipidemia 02/21/2018    Priority: High   Obesity, Class I, BMI 30-34.9 02/21/2018    Priority: High   FHx: early coronary artery disease- Dad in 6250's, sev others 02/21/2018    Priority: High   Personal history of primary hypertension 02/21/2018    Priority: High   Statin intolerance- "severe HA's to all statins/ chol meds" 02/21/2018    Priority: Medium   Family history of prostate cancer in father 02/21/2018    Priority: Medium   Gastroesophageal reflux disease 02/21/2018    Priority: Low   Erectile dysfunction 02/21/2018    Priority: Low   Chronic bilateral low back pain 02/21/2018    Priority: Low   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/2018   Degenerative disc disease, lumbar 02/21/2018   h/o Cervical vertebral fusion 02/21/2018   Family history of combined hyperlipidemia 02/21/2018   Family history of essential hypertension 02/21/2018   h/o B/L Biceps tendon rupture with surgical repair B/L 02/21/2018   Family history of diabetes mellitus in brother, father/ other relatives 02/21/2018    Past Medical History:  Diagnosis Date   High cholesterol     Past Surgical History:  Procedure Laterality Date   BICEPS TENDON REPAIR Bilateral    NECK SURGERY      Social History   Tobacco Use   Smoking status: Never Smoker   Smokeless tobacco: Never Used  Substance  Use Topics   Alcohol use: Never    Frequency: Never    Family Hx: Family History  Problem Relation Age of Onset   Prostate cancer Father    Coronary artery disease Father    Diabetes Brother    Prostate cancer Paternal Uncle    Diabetes Paternal Grandmother    Stroke Paternal Aunt      Medications: Current Outpatient Medications  Medication Sig Dispense Refill   aspirin 325 MG tablet Take 81 mg by mouth daily.     omeprazole (PRILOSEC) 20 MG capsule Take 1 capsule (20 mg total) by mouth daily. 90 capsule 1   rosuvastatin (CRESTOR) 5 MG tablet 1/2 tab qhs QOD 45 tablet 0   Vitamin D, Ergocalciferol, (DRISDOL) 1.25 MG (50000 UT) CAPS capsule Take one tablet wkly 12 capsule 3   No current facility-administered medications for this visit.     Allergies:  Allergies  Allergen Reactions   Niacin And Related Other (See Comments)    Intense heat    Statins Other (See Comments)    Extreme headaches     Review of Systems: General:   No F/C, wt loss Pulm:   No DIB, SOB, pleuritic chest pain Card:  No CP, palpitations Abd:  No n/v/d or pain Ext:  No inc edema from baseline  Objective:  Blood pressure 140/88, pulse 73, height 5\' 10"  (1.778 m), weight 222 lb (100.7 kg), SpO2 97 %. Body mass index is 31.85 kg/m. Gen:   Well NAD, A and O *3 HEENT:    [place]/AT, EOMI,  MMM Lungs:   Normal work of breathing. CTA B/L, no Wh, rhonchi Heart:   RRR, S1, S2 WNL's, no MRG Abd:   No gross distention Exts:    warm, pink,  Brisk capillary refill, warm and well perfused.  Psych:    No HI/SI, judgement and insight good, Euthymic mood. Full Affect.   No results found for this or any previous visit (from the past 2160 hour(s)).

## 2018-11-27 NOTE — Patient Instructions (Addendum)
Please look into the OMRON series three blood pressure arm cuff to measure your blood pressure at home.  Please check your blood pressure at home at least 2-3 times per week.  Please write down the blood pressure as well as the pulses.  Bring in next time we meet in 8 weeks.  This will be a recheck of how you are doing on the new cholesterol medicines, as well as we will need to obtain a ALT.  In 4 months we will need to recheck your cholesterol panel, as well as your A1c for your prediabetes.  Risk factors for prediabetes and type 2 diabetes  Researchers don't fully understand why some people develop prediabetes and type 2 diabetes and others don't.  It's clear that certain factors increase the risk, however, including:  Weight. The more fatty tissue you have, the more resistant your cells become to insulin.  Inactivity. The less active you are, the greater your risk. Physical activity helps you control your weight, uses up glucose as energy and makes your cells more sensitive to insulin.  Family history. Your risk increases if a parent or sibling has type 2 diabetes.  Race. Although it's unclear why, people of certain races -- including blacks, Hispanics, American Indians and Asian-Americans -- are at higher risk.  Age. Your risk increases as you get older. This may be because you tend to exercise less, lose muscle mass and gain weight as you age. But type 2 diabetes is also increasing dramatically among children, adolescents and younger adults.  Gestational diabetes. If you developed gestational diabetes when you were pregnant, your risk of developing prediabetes and type 2 diabetes later increases. If you gave birth to a baby weighing more than 9 pounds (4 kilograms), you're also at risk of type 2 diabetes.  Polycystic ovary syndrome. For women, having polycystic ovary syndrome -- a common condition characterized by irregular menstrual periods, excess hair growth and obesity -- increases the risk  of diabetes.  High blood pressure. Having blood pressure over 140/90 millimeters of mercury (mm Hg) is linked to an increased risk of type 2 diabetes.  Abnormal cholesterol and triglyceride levels. If you have low levels of high-density lipoprotein (HDL), or "good," cholesterol, your risk of type 2 diabetes is higher. Triglycerides are another type of fat carried in the blood. People with high levels of triglycerides have an increased risk of type 2 diabetes. Your doctor can let you know what your cholesterol and triglyceride levels are.  A good guide to good carbs: The glycemic index ---If you have diabetes, or at risk for diabetes, you know all too well that when you eat carbohydrates, your blood sugar goes up. The total amount of carbs you consume at a meal or in a snack mostly determines what your blood sugar will do. But the food itself also plays a role. A serving of white rice has almost the same effect as eating pure table sugar -- a quick, high spike in blood sugar. A serving of lentils has a slower, smaller effect.  ---Picking good sources of carbs can help you control your blood sugar and your weight. Even if you don't have diabetes, eating healthier carbohydrate-rich foods can help ward off a host of chronic conditions, from heart disease to various cancers to, well, diabetes.  ---One way to choose foods is with the glycemic index (GI). This tool measures how much a food boosts blood sugar.  The glycemic index rates the effect of a specific amount of  a food on blood sugar compared with the same amount of pure glucose. A food with a glycemic index of 28 boosts blood sugar only 28% as much as pure glucose. One with a GI of 95 acts like pure glucose.    High glycemic foods result in a quick spike in insulin and blood sugar (also known as blood glucose).  Low glycemic foods have a slower, smaller effect- these are healthier for you.   Using the glycemic index Using the glycemic index is easy:  choose foods in the low GI category instead of those in the high GI category (see below), and go easy on those in between. Low glycemic index (GI of 55 or less): Most fruits and vegetables, beans, minimally processed grains, pasta, low-fat dairy foods, and nuts.  Moderate glycemic index (GI 56 to 69): White and sweet potatoes, corn, white rice, couscous, breakfast cereals such as Cream of Wheat and Mini Wheats.  High glycemic index (GI of 70 or higher): White bread, rice cakes, most crackers, bagels, cakes, doughnuts, croissants, most packaged breakfast cereals. You can see the values for 100 commons foods and get links to more at [email or web address].  Swaps for lowering glycemic index  Instead of this high-glycemic index food Eat this lower-glycemic index food  White rice Brown rice or converted rice  Instant oatmeal Steel-cut oats  Cornflakes Bran flakes  Baked potato Pasta, bulgur  White bread Whole-grain bread  Corn Peas or leafy greens       Prediabetes Eating Plan  Prediabetes--also called impaired glucose tolerance or impaired fasting glucose--is a condition that causes blood sugar (blood glucose) levels to be higher than normal. Following a healthy diet can help to keep prediabetes under control. It can also help to lower the risk of type 2 diabetes and heart disease, which are increased in people who have prediabetes. Along with regular exercise, a healthy diet:  Promotes weight loss.  Helps to control blood sugar levels.  Helps to improve the way that the body uses insulin.   WHAT DO I NEED TO KNOW ABOUT THIS EATING PLAN?   Use the glycemic index (GI) to plan your meals. The index tells you how quickly a food will raise your blood sugar. Choose low-GI foods. These foods take a longer time to raise blood sugar.  Pay close attention to the amount of carbohydrates in the food that you eat. Carbohydrates increase blood sugar levels.  Keep track of how many  calories you take in. Eating the right amount of calories will help you to achieve a healthy weight. Losing about 7 percent of your starting weight can help to prevent type 2 diabetes.  You may want to follow a Mediterranean diet. This diet includes a lot of vegetables, lean meats or fish, whole grains, fruits, and healthy oils and fats.   WHAT FOODS CAN I EAT?  Grains Whole grains, such as whole-wheat or whole-grain breads, crackers, cereals, and pasta. Unsweetened oatmeal. Bulgur. Barley. Quinoa. Brown rice. Corn or whole-wheat flour tortillas or taco shells. Vegetables Lettuce. Spinach. Peas. Beets. Cauliflower. Cabbage. Broccoli. Carrots. Tomatoes. Squash. Eggplant. Herbs. Peppers. Onions. Cucumbers. Brussels sprouts. Fruits Berries. Bananas. Apples. Oranges. Grapes. Papaya. Mango. Pomegranate. Kiwi. Grapefruit. Cherries. Meats and Other Protein Sources Seafood. Lean meats, such as chicken and [place] or lean cuts of pork and beef. Tofu. Eggs. Nuts. Beans. Dairy Low-fat or fat-free dairy products, such as yogurt, cottage cheese, and cheese. Beverages Water. Tea. Coffee. Sugar-free or diet soda. Seltzer water. Milk. Milk alternatives,  such as soy or almond milk. Condiments Mustard. Relish. Low-fat, low-sugar ketchup. Low-fat, low-sugar barbecue sauce. Low-fat or fat-free mayonnaise. Sweets and Desserts Sugar-free or low-fat pudding. Sugar-free or low-fat ice cream and other frozen treats. Fats and Oils Avocado. Walnuts. Olive oil. The items listed above may not be a complete list of recommended foods or beverages. Contact your dietitian for more options.    WHAT FOODS ARE NOT RECOMMENDED?  Grains Refined white flour and flour products, such as bread, pasta, snack foods, and cereals. Beverages Sweetened drinks, such as sweet iced tea and soda. Sweets and Desserts Baked goods, such as cake, cupcakes, pastries, cookies, and cheesecake. The items listed above may not be a complete  list of foods and beverages to avoid. Contact your dietitian for more information.   This information is not intended to replace advice given to you by your health care provider. Make sure you discuss any questions you have with your health care provider.   Document Released: 08/05/2014 Document Reviewed: 08/05/2014 Elsevier Interactive Patient Education [employer]          Your goal blood pressure should be 130/80 or less on a regular basis, or medications should be started/ modified.    Normal blood pressure is less than 120/80.    Hypertension Hypertension, commonly called high blood pressure, is when the force of blood pumping through the arteries is too strong. The arteries are the blood vessels that carry blood from the heart throughout the body. Hypertension forces the heart to work harder to pump blood and may cause arteries to become narrow or stiff. Having untreated or uncontrolled hypertension can cause heart attacks, strokes, kidney disease, and other problems. A blood pressure reading consists of a higher number over a lower number. Ideally, your blood pressure should be below 120/80. The first ("top") number is called the systolic pressure. It is a measure of the pressure in your arteries as your heart beats. The second ("bottom") number is called the diastolic pressure. It is a measure of the pressure in your arteries as the heart relaxes. What are the causes? The cause of this condition is not known. What increases the risk? Some risk factors for high blood pressure are under your control. Others are not. Factors you can change  Smoking.  Having type 2 diabetes mellitus, high cholesterol, or both.  Not getting enough exercise or physical activity.  Being overweight.  Having too much fat, sugar, calories, or salt (sodium) in your diet.  Drinking too much alcohol. Factors that are difficult or impossible to change  Having chronic kidney  disease.  Having a family history of high blood pressure.  Age. Risk increases with age.  Race. You may be at higher risk if you are African-American.  Gender. Men are at higher risk than women before age 40. After age 70, women are at higher risk than men.  Having obstructive sleep apnea.  Stress. What are the signs or symptoms? Extremely high blood pressure (hypertensive crisis) may cause:  Headache.  Anxiety.  Shortness of breath.  Nosebleed.  Nausea and vomiting.  Severe chest pain.  Jerky movements you cannot control (seizures).  How is this diagnosed? This condition is diagnosed by measuring your blood pressure while you are seated, with your arm resting on a surface. The cuff of the blood pressure monitor will be placed directly against the skin of your upper arm at the level of your heart. It should be measured at least twice using the same  arm. Certain conditions can cause a difference in blood pressure between your right and left arms. Certain factors can cause blood pressure readings to be lower or higher than normal (elevated) for a short period of time:  When your blood pressure is higher when you are in a health care provider's office than when you are at home, this is called white coat hypertension. Most people with this condition do not need medicines.  When your blood pressure is higher at home than when you are in a health care provider's office, this is called masked hypertension. Most people with this condition may need medicines to control blood pressure.  If you have a high blood pressure reading during one visit or you have normal blood pressure with other risk factors:  You may be asked to return on a different day to have your blood pressure checked again.  You may be asked to monitor your blood pressure at home for 1 week or longer.  If you are diagnosed with hypertension, you may have other blood or imaging tests to help your health care provider  understand your overall risk for other conditions. How is this treated? This condition is treated by making healthy lifestyle changes, such as eating healthy foods, exercising more, and reducing your alcohol intake. Your health care provider may prescribe medicine if lifestyle changes are not enough to get your blood pressure under control, and if:  Your systolic blood pressure is above 130.  Your diastolic blood pressure is above 80.  Your personal target blood pressure may vary depending on your medical conditions, your age, and other factors. Follow these instructions at home: Eating and drinking  Eat a diet that is high in fiber and potassium, and low in sodium, added sugar, and fat. An example eating plan is called the DASH (Dietary Approaches to Stop Hypertension) diet. To eat this way: ? Eat plenty of fresh fruits and vegetables. Try to fill half of your plate at each meal with fruits and vegetables. ? Eat whole grains, such as whole wheat pasta, brown rice, or whole grain bread. Fill about one quarter of your plate with whole grains. ? Eat or drink low-fat dairy products, such as skim milk or low-fat yogurt. ? Avoid fatty cuts of meat, processed or cured meats, and poultry with skin. Fill about one quarter of your plate with lean proteins, such as fish, chicken without skin, beans, eggs, and tofu. ? Avoid premade and processed foods. These tend to be higher in sodium, added sugar, and fat.  Reduce your daily sodium intake. Most people with hypertension should eat less than 1,500 mg of sodium a day.  Limit alcohol intake to no more than 1 drink a day for nonpregnant women and 2 drinks a day for men. One drink equals 12 oz of beer, 5 oz of wine, or 1 oz of hard liquor. Lifestyle  Work with your health care provider to maintain a healthy body weight or to lose weight. Ask what an ideal weight is for you.  Get at least 30 minutes of exercise that causes your heart to beat faster  (aerobic exercise) most days of the week. Activities may include walking, swimming, or biking.  Include exercise to strengthen your muscles (resistance exercise), such as pilates or lifting weights, as part of your weekly exercise routine. Try to do these types of exercises for 30 minutes at least 3 days a week.  Do not use any products that contain nicotine or tobacco, such as  cigarettes and e-cigarettes. If you need help quitting, ask your health care provider.  Monitor your blood pressure at home as told by your health care provider.  Keep all follow-up visits as told by your health care provider. This is important. Medicines  Take over-the-counter and prescription medicines only as told by your health care provider. Follow directions carefully. Blood pressure medicines must be taken as prescribed.  Do not skip doses of blood pressure medicine. Doing this puts you at risk for problems and can make the medicine less effective.  Ask your health care provider about side effects or reactions to medicines that you should watch for. Contact a health care provider if:  You think you are having a reaction to a medicine you are taking.  You have headaches that keep coming back (recurring).  You feel dizzy.  You have swelling in your ankles.  You have trouble with your vision. Get help right away if:  You develop a severe headache or confusion.  You have unusual weakness or numbness.  You feel faint.  You have severe pain in your chest or abdomen.  You vomit repeatedly.  You have trouble breathing. Summary  Hypertension is when the force of blood pumping through your arteries is too strong. If this condition is not controlled, it may put you at risk for serious complications.  Your personal target blood pressure may vary depending on your medical conditions, your age, and other factors. For most people, a normal blood pressure is less than 120/80.  Hypertension is treated with  lifestyle changes, medicines, or a combination of both. Lifestyle changes include weight loss, eating a healthy, low-sodium diet, exercising more, and limiting alcohol. This information is not intended to replace advice given to you by your health care provider. Make sure you discuss any questions you have with your health care provider. Document Released: 03/21/2005 Document Revised: 02/17/2016 Document Reviewed: 02/17/2016 Elsevier Interactive Patient Education  2018 [employer].    How to Take Your Blood Pressure   Blood pressure is a measurement of how strongly your blood is pressing against the walls of your arteries. Arteries are blood vessels that carry blood from your heart throughout your body. Your health care provider takes your blood pressure at each office visit. You can also take your own blood pressure at home with a blood pressure machine. You may need to take your own blood pressure:  To confirm a diagnosis of high blood pressure (hypertension).  To monitor your blood pressure over time.  To make sure your blood pressure medicine is working.  Supplies needed: To take your blood pressure, you will need a blood pressure machine. You can buy a blood pressure machine, or blood pressure monitor, at most drugstores or online. There are several types of home blood pressure monitors. When choosing one, consider the following:  Choose a monitor that has an arm cuff.  Choose a monitor that wraps snugly around your upper arm. You should be able to fit only one finger between your arm and the cuff.  Do not choose a monitor that measures your blood pressure from your wrist or finger.  Your health care provider can suggest a reliable monitor that will meet your needs. How to prepare To get the most accurate reading, avoid the following for 30 minutes before you check your blood pressure:  Drinking caffeine.  Drinking alcohol.  Eating.  Smoking.  Exercising.  Five minutes  before you check your blood pressure:  Empty your bladder.  Sit quietly without talking in a dining chair, rather than in a soft couch or armchair.  How to take your blood pressure To check your blood pressure, follow the instructions in the manual that came with your blood pressure monitor. If you have a digital blood pressure monitor, the instructions may be as follows: 1. Sit up straight. 2. Place your feet on the floor. Do not cross your ankles or legs. 3. Rest your left arm at the level of your heart on a table or desk or on the arm of a chair. 4. Pull up your shirt sleeve. 5. Wrap the blood pressure cuff around the upper part of your left arm, 1 inch (2.5 cm) above your elbow. It is best to wrap the cuff around bare skin. 6. Fit the cuff snugly around your arm. You should be able to place only one finger between the cuff and your arm. 7. Position the cord inside the groove of your elbow. 8. Press the power button. 9. Sit quietly while the cuff inflates and deflates. 10. Read the digital reading on the monitor screen and write it down (record it). 11. Wait 2-3 minutes, then repeat the steps, starting at step 1.  What does my blood pressure reading mean? A blood pressure reading consists of a higher number over a lower number. Ideally, your blood pressure should be below 120/80. The first ("top") number is called the systolic pressure. It is a measure of the pressure in your arteries as your heart beats. The second ("bottom") number is called the diastolic pressure. It is a measure of the pressure in your arteries as the heart relaxes. Blood pressure is classified into four stages. The following are the stages for adults who do not have a short-term serious illness or a chronic condition. Systolic pressure and diastolic pressure are measured in a unit called mm Hg. Normal  Systolic pressure: below 120.  Diastolic pressure: below 80. Elevated  Systolic pressure: 120-129.  Diastolic  pressure: below 80. Hypertension stage 1  Systolic pressure: 130-139.  Diastolic pressure: 80-89. Hypertension stage 2  Systolic pressure: 140 or above.  Diastolic pressure: 90 or above. You can have prehypertension or hypertension even if only the systolic or only the diastolic number in your reading is higher than normal. Follow these instructions at home:  Check your blood pressure as often as recommended by your health care provider.  Take your monitor to the next appointment with your health care provider to make sure: ? That you are using it correctly. ? That it provides accurate readings.  Be sure you understand what your goal blood pressure numbers are.  Tell your health care provider if you are having any side effects from blood pressure medicine. Contact a health care provider if:  Your blood pressure is consistently high. Get help right away if:  Your systolic blood pressure is higher than 180.  Your diastolic blood pressure is higher than 110. This information is not intended to replace advice given to you by your health care provider. Make sure you discuss any questions you have with your health care provider. Document Released: 08/28/2015 Document Revised: 11/10/2015 Document Reviewed: 08/28/2015 Elsevier Interactive Patient Education  2018 [employer].   Guidelines for a Low Cholesterol, Low Saturated Fat Diet   Fats - Limit total intake of fats and oils. - Avoid butter, stick margarine, shortening, lard, palm and coconut oils. - Limit mayonnaise, salad dressings, gravies and sauces, unless they are homemade with low-fat ingredients. -  Limit chocolate. - Choose low-fat and nonfat products, such as low-fat mayonnaise, low-fat or non-hydrogenated peanut butter, low-fat or fat-free salad dressings and nonfat gravy. - Use vegetable oil, such as canola or olive oil. - Look for margarine that does not contain trans fatty acids. - Use nuts in moderate  amounts. - Read ingredient labels carefully to determine both amount and type of fat present in foods. Limit saturated and trans fats! - Avoid high-fat processed and convenience foods.  Meats and Meat Alternatives - Choose fish, chicken, [place] and lean meats. - Use dried beans, peas, lentils and tofu. - Limit egg yolks to three to four per week. - If you eat red meat, limit to no more than three servings per week and choose loin or round cuts. - Avoid fatty meats, such as bacon, sausage, franks, luncheon meats and ribs. - Avoid all organ meats, including liver.  Dairy - Choose nonfat or low-fat milk, yogurt and cottage cheese. - Most cheeses are high in fat. Choose cheeses made from non-fat milk, such as mozzarella and ricotta cheese. - Choose light or fat-free cream cheese and sour cream. - Avoid cream and sauces made with cream.  Fruits and Vegetables - Eat a wide variety of fruits and vegetables. - Use lemon juice, vinegar or "mist" olive oil on vegetables. - Avoid adding sauces, fat or oil to vegetables.  Breads, Cereals and Grains - Choose whole-grain breads, cereals, pastas and rice. - Avoid high-fat snack foods, such as granola, cookies, pies, pastries, doughnuts and croissants.  Cooking Tips - Avoid deep fried foods. - Trim visible fat off meats and remove skin from poultry before cooking. - Bake, broil, boil, poach or roast poultry, fish and lean meats. - Drain and discard fat that drains out of meat as you cook it. - Add little or no fat to foods. - Use vegetable oil sprays to grease pans for cooking or baking. - Steam vegetables. - Use herbs or no-oil marinades to flavor foods.

## 2018-12-13 ENCOUNTER — Encounter: Payer: Self-pay | Admitting: Sports Medicine

## 2018-12-13 ENCOUNTER — Ambulatory Visit: Payer: Managed Care, Other (non HMO) | Admitting: Sports Medicine

## 2018-12-13 ENCOUNTER — Ambulatory Visit
Admission: RE | Admit: 2018-12-13 | Discharge: 2018-12-13 | Disposition: A | Discharge status: Home / Self Care | Payer: Managed Care, Other (non HMO) | Source: Ambulatory Visit | Attending: Sports Medicine | Admitting: Sports Medicine

## 2018-12-13 ENCOUNTER — Other Ambulatory Visit: Payer: Self-pay

## 2018-12-13 VITALS — BP 132/92 | Ht 70.0 in | Wt 220.0 lb

## 2018-12-13 DIAGNOSIS — M5416 Radiculopathy, lumbar region: Secondary | ICD-10-CM

## 2018-12-13 DIAGNOSIS — M1990 Unspecified osteoarthritis, unspecified site: Secondary | ICD-10-CM | POA: Diagnosis not present

## 2018-12-13 DIAGNOSIS — M25552 Pain in left hip: Secondary | ICD-10-CM | POA: Insufficient documentation

## 2018-12-13 MED ORDER — PREDNISONE 10 MG PO TABS: ORAL_TABLET | ORAL | 0 refills | Status: DC

## 2018-12-13 NOTE — Progress Notes (Signed)
PCP: Mellody Dance, DO  Subjective:   CC: Patient is a 57 y.o. male here for back and left leg pain.  HPI: 1 year ago patient had injury while leaning over and supporting his upper body with his legs and he felt a double pop in the back of his left knee which had pain in his left butt cheek.  For 1 to 2 weeks after the injury patient had discomfort with his left leg which felt like it was floppy and painful but the symptoms resolved spontaneously without treatment or intervention.  He has been essentially symptom-free since that time until about 1 month ago when he started doing strenuous activity building a structure which included a lot of walking up and down a ladder and twisting of his back.  So, for the past month patient has had increased pain in that upper buttock area and also in his left lateral calf.  Patient describes the pain as a burning and throbbing sensation which is worsened by standing especially when he is barefoot.  The pain is improved when he is sitting or laying and is able to relax the muscles.  He has tried an anti-inflammatory, aspirin, Advil which have had significant improvement of the pain for about a day and then symptoms will return.  Patient denies having radiation of the pain from his buttocks down his leg, denies weakness, foot drop, numbness and tingling, loss of bowel or bladder control, weight loss, fever, night sweats. 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   ROS: See HPI above.  I have personally reviewed pertinent past medical history, surgical, family, and social history as appropriate.      Objective:  BP (!) 132/92   Ht _0  (1.778 m)   Wt 220 lb (99.8 kg)   BMI 31.57 kg/m   Physical Exam: Gen: NAD, comfortable in exam room  Back: Observation: Negative for asymmetry, erythema, masses Palpation: Negative for vertebral line  tenderness with palpation ROM: Patient has algesic sidebending, flexion, extension but is able to have full passive range of motion Sensation: Patient has symmetrical sensation bilaterally  Lower extremities: Observation: Patient's right leg appears mildly more muscular than the left.  Possible very mild muscular atrophy on the left leg.  Palpation: Palpation over left superior SI joint produces pain with deep palpation.  Pain was exacerbated with resisted abduction of hip. ROM: Patient has symmetrical bilateral range of motion intact including flexion, extension, abduction with hip Strength: Patient has 5 out of 5 strength in bilateral hips and knees full strength with dorsiflexion and plantar flexion bilaterally.  Patient has 4-5 strength with raising of right first digit on foot. 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  Doristine Mango, [facility] PGY-2  Patient seen and evaluated with the resident.  I agree with the above plan of care.  Patient's presentation is somewhat mixed.  The fact that he has pain both in the left buttock as well as the lateral left leg makes me suspicious of lumbar disc pathology.  I would like to get a plain x-ray of his lumbar spine and I will request the records from his previous  physician.  6-day Sterapred Dosepak to take as directed.  I would also like to check some blood work to rule out autoimmune disease such as rheumatoid arthritis given his history of bilateral biceps tendon ruptures and his previous diagnosis of "arthritis".  Patient will follow-up with me in 2 to 3 weeks.  If symptoms persist then we may need to consider merits of updated MRI of his lumbar spine.  Patient will call with questions or concerns in the interim.

## 2018-12-13 NOTE — Assessment & Plan Note (Signed)
Likely secondary to discogenic dysfunction -Prescribed steroid pack for 6 days -Obtain lumbar x-ray -Ordered blood work to assess for rheumatoid disease with extensive history including CBC, CMP, ESR, CRP, ANA, RF, anti-CCP -Attempt to obtain records from prior PCP to evaluate MRI of spine  -Consider reimaging of lumbar spine if not improved with conservative therapy and compare with previous imaging -Follow-up in 2 to 3 weeks

## 2018-12-13 NOTE — Patient Instructions (Signed)
When you leave here head up to the [facility] to get your blood work done. [employer] [employer].  If you have time today go get the x-ray of your low back. See [place] back in 2-3 weeks.

## 2018-12-15 LAB — CBC
Hematocrit: 48.2 % (ref 37.5–51.0)
Hemoglobin: 17 g/dL (ref 13.0–17.7)
MCH: 32 pg (ref 26.6–33.0)
MCHC: 35.3 g/dL (ref 31.5–35.7)
MCV: 91 fL (ref 79–97)
Platelets: 282 10*3/uL (ref 150–450)
RBC: 5.32 x10E6/uL (ref 4.14–5.80)
RDW: 12.5 % (ref 11.6–15.4)
WBC: 5.4 10*3/uL (ref 3.4–10.8)

## 2018-12-15 LAB — COMPREHENSIVE METABOLIC PANEL
ALT: 38 IU/L (ref 0–44)
AST: 27 IU/L (ref 0–40)
Albumin/Globulin Ratio: 2.2 (ref 1.2–2.2)
Albumin: 4.6 g/dL (ref 3.8–4.9)
Alkaline Phosphatase: 62 IU/L (ref 39–117)
BUN/Creatinine Ratio: 11 (ref 9–20)
BUN: 12 mg/dL (ref 6–24)
Bilirubin Total: 0.5 mg/dL (ref 0.0–1.2)
CO2: 19 mmol/L — ABNORMAL LOW (ref 20–29)
Calcium: 9.6 mg/dL (ref 8.7–10.2)
Chloride: 103 mmol/L (ref 96–106)
Creatinine, Ser: 1.13 mg/dL (ref 0.76–1.27)
GFR calc Af Amer: 84 mL/min/{1.73_m2} (ref >59)
GFR calc non Af Amer: 72 mL/min/{1.73_m2} (ref >59)
Globulin, Total: 2.1 g/dL (ref 1.5–4.5)
Glucose: 110 mg/dL — ABNORMAL HIGH (ref 65–99)
Potassium: 4.5 mmol/L (ref 3.5–5.2)
Sodium: 138 mmol/L (ref 134–144)
Total Protein: 6.7 g/dL (ref 6.0–8.5)

## 2018-12-15 LAB — CYCLIC CITRUL PEPTIDE ANTIBODY, IGG/IGA: Cyclic Citrullin Peptide Ab: 4 units (ref 0–19)

## 2018-12-15 LAB — RHEUMATOID FACTOR: Rhuematoid fact SerPl-aCnc: <10 IU/mL (ref 0.0–13.9)

## 2018-12-15 LAB — ANA: Anti Nuclear Antibody (ANA): NEGATIVE

## 2018-12-15 LAB — SEDIMENTATION RATE: Sed Rate: 8 mm/hr (ref 0–30)

## 2018-12-15 LAB — C-REACTIVE PROTEIN: CRP: <1 mg/L (ref 0–10)

## 2019-01-03 ENCOUNTER — Other Ambulatory Visit: Payer: Self-pay

## 2019-01-03 ENCOUNTER — Ambulatory Visit (INDEPENDENT_AMBULATORY_CARE_PROVIDER_SITE_OTHER): Payer: Managed Care, Other (non HMO) | Admitting: Sports Medicine

## 2019-01-03 VITALS — BP 140/96 | Ht 70.0 in | Wt 220.0 lb

## 2019-01-03 DIAGNOSIS — M25552 Pain in left hip: Secondary | ICD-10-CM | POA: Diagnosis not present

## 2019-01-03 DIAGNOSIS — M1990 Unspecified osteoarthritis, unspecified site: Secondary | ICD-10-CM | POA: Diagnosis not present

## 2019-01-03 DIAGNOSIS — M5416 Radiculopathy, lumbar region: Secondary | ICD-10-CM

## 2019-01-03 NOTE — Progress Notes (Signed)
Danny Ayers is a 58 y.o. male who presents to [facility] today for f/u lumbar radiculopathy and left hip pain.  Was seen on 9/10.  Given steroid pack.  Pain is now in both legs.  States that before was in left more.  Pain went from 10/10 to 1-2/10 on steroids, day after finishing steroid pack, pain came back and has progressively gotten worse since last visit.  States with previous injuries, he has always had right hip pain that radiates into the groin and feels better when hunching over.  Now he states that pain is all across back and radiating into both legs.  Pain radiates from butt to lateral leg bilaterally.  Worsens with sitting in certain ways, worse with extending knee and hip, bending over to pick up something from the ground, standing on concrete, standing barefoot.  Gets better with continuous walking and movement.  Currently rates the pain 10/10 on both sides. Feels "pain like a broken bone," numbness and tingling in same distrubution as pain, that occasionally goes into foot.  Felt a "lightning bolt in right foot the other day while sitting."   No saddle anesthesia.  Since injury a few years ago, has had difficulty holding urination and bowel movements, was told that this was "expected."  Endorses weakness in his legs and "legs are wobbly" sometimes when walking.  No weight loss, fever, night sweats.  Activity is limited 2/2 pain and feels it is affecting his health.  Has not taken any other pain medications after steroids.  Before steroids, was taking Advil, ASA, and something else he can't remember.   PMH reviewed. 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  Notes Lumbar MRI from 2014. ROS as above  Medications reviewed.  Exam:  BP (!) 140/96   Ht 5\' 10"  (1.778 m)   Wt 220 lb (99.8 kg)   BMI 31.57 kg/m  Gen: Well NAD MSK: Lumbar spine: - Inspection: no gross  deformity or asymmetry, swelling or ecchymosis - Palpation: No TTP over the spinous processes, paraspinal muscles, or SI joints b/l - ROM: active ROM limited to 20 degrees of flexion and 10 degrees of extension 2/2 pain - Strength: 5/5 strength of lower extremity in L4-S1 nerve root distributions b/l aside from 4/5 strength in dorsiflexion and foot inversion and eversion bilaterally; normal gait - Neuro: sensation intact in the L4-S1 nerve root distribution b/l, 2+ L4 and 1+ S1 reflexes - Special testing: Negative straight leg raise, negative Stork test    Dg Lumbar Spine 2-3 Views  Result Date: 12/13/2018 CLINICAL DATA:  Lumbago with radicular symptoms EXAM: LUMBAR SPINE - 2-3 VIEW COMPARISON:  None. FINDINGS: Frontal, lateral, and spot lumbosacral lateral images were obtained. There are 5 non-rib-bearing lumbar type vertebral bodies. There is no fracture or spondylolisthesis. There is moderate disc space narrowing at L5-S1 with slightly milder disc space narrowing at L4-5. Other disc spaces appear unremarkable. There are small anterior osteophytes at L3, L4, L5, and S1. No erosive changes. IMPRESSION: Osteoarthritic change at L5-S1 and to a lesser degree at L4-5. No fracture or spondylolisthesis. Electronically Signed   By: Lowella Grip III M.D.   On: 12/13/2018 15:43   Labs reviewed and negative for RA   Assessment and Plan: 1) Lumbar radiculopathy Lumbar XR with L5-S1 osteoathritis.  Patient had good improvement with steroids, but declines further steroids at this time.  Declines  toradol and steroid injection.  Will obtain MRI to rule out disc herniation given worsening of symptom.  Offered prescription NSAIDs, but patient declines.  Advised to use Advil, can take 2 tabs q4-6 hr prn pain and will follow up after MRI results.   [name], D.O.  PGY-2 Family Medicine  01/03/2019 11:14 AM  Patient seen and evaluated with the resident.  I agree with the above plan of care.   Records from his previous orthopedist were reviewed including an MRI report from 2014 of the lumbar spine.  That report showed small disc bulges at L4-L5 and L5-S1 as well as some mild facet arthropathy at L3-L4.  I am concerned that the patient may have a central disc herniation which may need surgical decompression.  His pain is becoming intractable and he is not improving with conservative treatment.  Therefore, we need to proceed with an updated MRI of his lumbar spine.  In the meantime, I did encourage him to take 400 mg of Advil as needed for pain.  I discussed IM injections and a repeat Sterapred Dosepak but patient declines.  Phone follow-up with MRI results when available.  We will delineate further treatment based on those findings.

## 2019-01-03 NOTE — Assessment & Plan Note (Signed)
Lumbar XR with L5-S1 osteoathritis.  Patient had good improvement with steroids, but declines further steroids at this time.  Declines toradol and steroid injection.  Will obtain MRI to rule out disc herniation given worsening of symptom.  Offered prescription NSAIDs, but patient declines.  Advised to use Advil, can take 2 tabs q4-6 hr prn pain and will follow up after MRI results.

## 2019-01-03 NOTE — Patient Instructions (Addendum)
Continue using advil, 2 tablets every 4-6 hours as needed for your pain. We can consider shots with steroids and toradol in the future if you would like.    We will also get an MRI-an order has been placed at the [facility]. [place], [place] [phone]  Please call them to schedule your MRI. They only schedule on Saturdays. Let them know you'd like an open MRI as well. Call us if you have any questions.

## 2019-01-08 ENCOUNTER — Other Ambulatory Visit: Payer: Self-pay | Admitting: *Deleted

## 2019-01-08 MED ORDER — DIAZEPAM 5 MG PO TABS: ORAL_TABLET | ORAL | 0 refills | Status: DC

## 2019-01-12 ENCOUNTER — Other Ambulatory Visit: Payer: Self-pay

## 2019-01-12 ENCOUNTER — Ambulatory Visit (HOSPITAL_BASED_OUTPATIENT_CLINIC_OR_DEPARTMENT_OTHER)
Admission: RE | Admit: 2019-01-12 | Discharge: 2019-01-12 | Disposition: A | Discharge status: Home / Self Care | Payer: Managed Care, Other (non HMO) | Source: Ambulatory Visit | Attending: Sports Medicine | Admitting: Sports Medicine

## 2019-01-12 DIAGNOSIS — M5416 Radiculopathy, lumbar region: Secondary | ICD-10-CM | POA: Diagnosis not present

## 2019-01-21 ENCOUNTER — Encounter: Payer: Self-pay | Admitting: Family Medicine

## 2019-01-21 ENCOUNTER — Ambulatory Visit (INDEPENDENT_AMBULATORY_CARE_PROVIDER_SITE_OTHER): Payer: Managed Care, Other (non HMO) | Admitting: Family Medicine

## 2019-01-21 ENCOUNTER — Other Ambulatory Visit: Payer: Self-pay

## 2019-01-21 ENCOUNTER — Telehealth: Payer: Self-pay | Admitting: Sports Medicine

## 2019-01-21 VITALS — BP 137/78 | HR 79 | Temp 97.9°F | Resp 14 | Ht 70.0 in | Wt 219.7 lb

## 2019-01-21 DIAGNOSIS — R030 Elevated blood-pressure reading, without diagnosis of hypertension: Secondary | ICD-10-CM | POA: Diagnosis not present

## 2019-01-21 DIAGNOSIS — E559 Vitamin D deficiency, unspecified: Secondary | ICD-10-CM

## 2019-01-21 DIAGNOSIS — E782 Mixed hyperlipidemia: Secondary | ICD-10-CM

## 2019-01-21 DIAGNOSIS — R7401 Elevation of levels of liver transaminase levels: Secondary | ICD-10-CM

## 2019-01-21 DIAGNOSIS — M5416 Radiculopathy, lumbar region: Secondary | ICD-10-CM

## 2019-01-21 DIAGNOSIS — E669 Obesity, unspecified: Secondary | ICD-10-CM

## 2019-01-21 DIAGNOSIS — Z789 Other specified health status: Secondary | ICD-10-CM

## 2019-01-21 DIAGNOSIS — Z8249 Family history of ischemic heart disease and other diseases of the circulatory system: Secondary | ICD-10-CM

## 2019-01-21 DIAGNOSIS — Z23 Encounter for immunization: Secondary | ICD-10-CM

## 2019-01-21 DIAGNOSIS — R7303 Prediabetes: Secondary | ICD-10-CM | POA: Insufficient documentation

## 2019-01-21 DIAGNOSIS — Z8679 Personal history of other diseases of the circulatory system: Secondary | ICD-10-CM | POA: Diagnosis not present

## 2019-01-21 MED ORDER — ROSUVASTATIN CALCIUM 5 MG PO TABS: ORAL_TABLET | ORAL | 1 refills | Status: DC

## 2019-01-21 NOTE — Patient Instructions (Signed)
Guidelines for a Low Cholesterol, Low Saturated Fat Diet   Fats - Limit total intake of fats and oils. - Avoid butter, stick margarine, shortening, lard, palm and coconut oils. - Limit mayonnaise, salad dressings, gravies and sauces, unless they are homemade with low-fat ingredients. - Limit chocolate. - Choose low-fat and nonfat products, such as low-fat mayonnaise, low-fat or non-hydrogenated peanut butter, low-fat or fat-free salad dressings and nonfat gravy. - Use vegetable oil, such as canola or olive oil. - Look for margarine that does not contain trans fatty acids. - Use nuts in moderate amounts. - Read ingredient labels carefully to determine both amount and type of fat present in foods. Limit saturated and trans fats! - Avoid high-fat processed and convenience foods.  Meats and Meat Alternatives - Choose fish, chicken, [place] and lean meats. - Use dried beans, peas, lentils and tofu. - Limit egg yolks to three to four per week. - If you eat red meat, limit to no more than three servings per week and choose loin or round cuts. - Avoid fatty meats, such as bacon, sausage, franks, luncheon meats and ribs. - Avoid all organ meats, including liver.  Dairy - Choose nonfat or low-fat milk, yogurt and cottage cheese. - Most cheeses are high in fat. Choose cheeses made from non-fat milk, such as mozzarella and ricotta cheese. - Choose light or fat-free cream cheese and sour cream. - Avoid cream and sauces made with cream.  Fruits and Vegetables - Eat a wide variety of fruits and vegetables. - Use lemon juice, vinegar or "mist" olive oil on vegetables. - Avoid adding sauces, fat or oil to vegetables.  Breads, Cereals and Grains - Choose whole-grain breads, cereals, pastas and rice. - Avoid high-fat snack foods, such as granola, cookies, pies, pastries, doughnuts and croissants.  Cooking Tips - Avoid deep fried foods. - Trim visible fat off meats and remove skin from poultry  before cooking. - Bake, broil, boil, poach or roast poultry, fish and lean meats. - Drain and discard fat that drains out of meat as you cook it. - Add little or no fat to foods. - Use vegetable oil sprays to grease pans for cooking or baking. - Steam vegetables. - Use herbs or no-oil marinades to flavor foods.      Your goal blood pressure should be 135/805or less on a regular basis, or medications should be started/ modified.    Normal blood pressure is less than 120/80.  Hypertension Hypertension, commonly called high blood pressure, is when the force of blood pumping through the arteries is too strong. The arteries are the blood vessels that carry blood from the heart throughout the body. Hypertension forces the heart to work harder to pump blood and may cause arteries to become narrow or stiff. Having untreated or uncontrolled hypertension can cause heart attacks, strokes, kidney disease, and other problems. A blood pressure reading consists of a higher number over a lower number. Ideally, your blood pressure should be below 120/80. The first ("top") number is called the systolic pressure. It is a measure of the pressure in your arteries as your heart beats. The second ("bottom") number is called the diastolic pressure. It is a measure of the pressure in your arteries as the heart relaxes. What are the causes? The cause of this condition is not known. What increases the risk? Some risk factors for high blood pressure are under your control. Others are not. Factors you can change  Smoking.  Having type 2 diabetes mellitus,  high cholesterol, or both.  Not getting enough exercise or physical activity.  Being overweight.  Having too much fat, sugar, calories, or salt (sodium) in your diet.  Drinking too much alcohol. Factors that are difficult or impossible to change  Having chronic kidney disease.  Having a family history of high blood pressure.  Age. Risk increases with  age.  Race. You may be at higher risk if you are African-American.  Gender. Men are at higher risk than women before age 20. After age 47, women are at higher risk than men.  Having obstructive sleep apnea.  Stress. What are the signs or symptoms? Extremely high blood pressure (hypertensive crisis) may cause:  Headache.  Anxiety.  Shortness of breath.  Nosebleed.  Nausea and vomiting.  Severe chest pain.  Jerky movements you cannot control (seizures).  How is this diagnosed? This condition is diagnosed by measuring your blood pressure while you are seated, with your arm resting on a surface. The cuff of the blood pressure monitor will be placed directly against the skin of your upper arm at the level of your heart. It should be measured at least twice using the same arm. Certain conditions can cause a difference in blood pressure between your right and left arms. Certain factors can cause blood pressure readings to be lower or higher than normal (elevated) for a short period of time:  When your blood pressure is higher when you are in a health care provider's office than when you are at home, this is called white coat hypertension. Most people with this condition do not need medicines.  When your blood pressure is higher at home than when you are in a health care provider's office, this is called masked hypertension. Most people with this condition may need medicines to control blood pressure.  If you have a high blood pressure reading during one visit or you have normal blood pressure with other risk factors:  You may be asked to return on a different day to have your blood pressure checked again.  You may be asked to monitor your blood pressure at home for 1 week or longer.  If you are diagnosed with hypertension, you may have other blood or imaging tests to help your health care provider understand your overall risk for other conditions. How is this treated? This  condition is treated by making healthy lifestyle changes, such as eating healthy foods, exercising more, and reducing your alcohol intake. Your health care provider may prescribe medicine if lifestyle changes are not enough to get your blood pressure under control, and if:  Your systolic blood pressure is above 130.  Your diastolic blood pressure is above 80.  Your personal target blood pressure may vary depending on your medical conditions, your age, and other factors. Follow these instructions at home: Eating and drinking  Eat a diet that is high in fiber and potassium, and low in sodium, added sugar, and fat. An example eating plan is called the DASH (Dietary Approaches to Stop Hypertension) diet. To eat this way: ? Eat plenty of fresh fruits and vegetables. Try to fill half of your plate at each meal with fruits and vegetables. ? Eat whole grains, such as whole wheat pasta, brown rice, or whole grain bread. Fill about one quarter of your plate with whole grains. ? Eat or drink low-fat dairy products, such as skim milk or low-fat yogurt. ? Avoid fatty cuts of meat, processed or cured meats, and poultry with skin. Fill about  one quarter of your plate with lean proteins, such as fish, chicken without skin, beans, eggs, and tofu. ? Avoid premade and processed foods. These tend to be higher in sodium, added sugar, and fat.  Reduce your daily sodium intake. Most people with hypertension should eat less than 1,500 mg of sodium a day.  Limit alcohol intake to no more than 1 drink a day for nonpregnant women and 2 drinks a day for men. One drink equals 12 oz of beer, 5 oz of wine, or 1 oz of hard liquor. Lifestyle  Work with your health care provider to maintain a healthy body weight or to lose weight. Ask what an ideal weight is for you.  Get at least 30 minutes of exercise that causes your heart to beat faster (aerobic exercise) most days of the week. Activities may include walking, swimming,  or biking.  Include exercise to strengthen your muscles (resistance exercise), such as pilates or lifting weights, as part of your weekly exercise routine. Try to do these types of exercises for 30 minutes at least 3 days a week.  Do not use any products that contain nicotine or tobacco, such as cigarettes and e-cigarettes. If you need help quitting, ask your health care provider.  Monitor your blood pressure at home as told by your health care provider.  Keep all follow-up visits as told by your health care provider. This is important. Medicines  Take over-the-counter and prescription medicines only as told by your health care provider. Follow directions carefully. Blood pressure medicines must be taken as prescribed.  Do not skip doses of blood pressure medicine. Doing this puts you at risk for problems and can make the medicine less effective.  Ask your health care provider about side effects or reactions to medicines that you should watch for. Contact a health care provider if:  You think you are having a reaction to a medicine you are taking.  You have headaches that keep coming back (recurring).  You feel dizzy.  You have swelling in your ankles.  You have trouble with your vision. Get help right away if:  You develop a severe headache or confusion.  You have unusual weakness or numbness.  You feel faint.  You have severe pain in your chest or abdomen.  You vomit repeatedly.  You have trouble breathing. Summary  Hypertension is when the force of blood pumping through your arteries is too strong. If this condition is not controlled, it may put you at risk for serious complications.  Your personal target blood pressure may vary depending on your medical conditions, your age, and other factors. For most people, a normal blood pressure is less than 120/80.  Hypertension is treated with lifestyle changes, medicines, or a combination of both. Lifestyle changes include  weight loss, eating a healthy, low-sodium diet, exercising more, and limiting alcohol. This information is not intended to replace advice given to you by your health care provider. Make sure you discuss any questions you have with your health care provider. Document Released: 03/21/2005 Document Revised: 02/17/2016 Document Reviewed: 02/17/2016 Elsevier Interactive Patient Education  2018 [employer].    How to Take Your Blood Pressure   Blood pressure is a measurement of how strongly your blood is pressing against the walls of your arteries. Arteries are blood vessels that carry blood from your heart throughout your body. Your health care provider takes your blood pressure at each office visit. You can also take your own blood pressure at home  with a blood pressure machine. You may need to take your own blood pressure:  To confirm a diagnosis of high blood pressure (hypertension).  To monitor your blood pressure over time.  To make sure your blood pressure medicine is working.  Supplies needed: To take your blood pressure, you will need a blood pressure machine. You can buy a blood pressure machine, or blood pressure monitor, at most drugstores or online. There are several types of home blood pressure monitors. When choosing one, consider the following:  Choose a monitor that has an arm cuff.  Choose a monitor that wraps snugly around your upper arm. You should be able to fit only one finger between your arm and the cuff.  Do not choose a monitor that measures your blood pressure from your wrist or finger.  Your health care provider can suggest a reliable monitor that will meet your needs. How to prepare To get the most accurate reading, avoid the following for 30 minutes before you check your blood pressure:  Drinking caffeine.  Drinking alcohol.  Eating.  Smoking.  Exercising.  Five minutes before you check your blood pressure:  Empty your bladder.  Sit quietly  without talking in a dining chair, rather than in a soft couch or armchair.  How to take your blood pressure To check your blood pressure, follow the instructions in the manual that came with your blood pressure monitor. If you have a digital blood pressure monitor, the instructions may be as follows: 1. Sit up straight. 2. Place your feet on the floor. Do not cross your ankles or legs. 3. Rest your left arm at the level of your heart on a table or desk or on the arm of a chair. 4. Pull up your shirt sleeve. 5. Wrap the blood pressure cuff around the upper part of your left arm, 1 inch (2.5 cm) above your elbow. It is best to wrap the cuff around bare skin. 6. Fit the cuff snugly around your arm. You should be able to place only one finger between the cuff and your arm. 7. Position the cord inside the groove of your elbow. 8. Press the power button. 9. Sit quietly while the cuff inflates and deflates. 10. Read the digital reading on the monitor screen and write it down (record it). 11. Wait 2-3 minutes, then repeat the steps, starting at step 1.  What does my blood pressure reading mean? A blood pressure reading consists of a higher number over a lower number. Ideally, your blood pressure should be below 120/80. The first ("top") number is called the systolic pressure. It is a measure of the pressure in your arteries as your heart beats. The second ("bottom") number is called the diastolic pressure. It is a measure of the pressure in your arteries as the heart relaxes. Blood pressure is classified into four stages. The following are the stages for adults who do not have a short-term serious illness or a chronic condition. Systolic pressure and diastolic pressure are measured in a unit called mm Hg. Normal  Systolic pressure: below 371.  Diastolic pressure: below 80. Elevated  Systolic pressure: 062-694.  Diastolic pressure: below 80. Hypertension stage 1  Systolic pressure: 854-627.   Diastolic pressure: 03-50. Hypertension stage 2  Systolic pressure: 093 or above.  Diastolic pressure: 90 or above. You can have prehypertension or hypertension even if only the systolic or only the diastolic number in your reading is higher than normal. Follow these instructions at home:  Check your blood pressure as often as recommended by your health care provider.  Take your monitor to the next appointment with your health care provider to make sure: ? That you are using it correctly. ? That it provides accurate readings.  Be sure you understand what your goal blood pressure numbers are.  Tell your health care provider if you are having any side effects from blood pressure medicine. Contact a health care provider if:  Your blood pressure is consistently high. Get help right away if:  Your systolic blood pressure is higher than 180.  Your diastolic blood pressure is higher than 110. This information is not intended to replace advice given to you by your health care provider. Make sure you discuss any questions you have with your health care provider. Document Released: 08/28/2015 Document Revised: 11/10/2015 Document Reviewed: 08/28/2015 Elsevier Interactive Patient Education  [employer].

## 2019-01-21 NOTE — Telephone Encounter (Signed)
  I spoke with Danny Ayers on the phone today after reviewing the MRI of his lumbar spine.  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  His symptoms have been present for about 3 months and he has failed conservative treatment to date.  Therefore, I recommended a referral to Dr. Lynann Bologna to discuss other treatment options.  Patient will check with his insurance to make sure that Dr. Lynann Bologna is within his network.  I will defer further work-up and treatment to the discretion of Dr. Lynann Bologna and the patient will follow up with me as needed.

## 2019-01-21 NOTE — Progress Notes (Signed)
Impression and Recommendations:    1. Elevated blood-pressure reading without diagnosis of hypertension   2. Personal history of primary hypertension   3. Mixed hyperlipidemia   4. Statin intolerance- "severe HA's to all statins/ chol meds"   5. Prediabetes   6. Vitamin D deficiency   7. Elevated ALT measurement   8. Obesity, Class I, BMI 30-34.9   9. FHx: early coronary artery disease- Dad in 44's, sev others   10. Need for immunization against influenza     Novel COVID-19 Counseling - Novel Covid -19 counseling done; all questions were answered.   - Current CDC / federal and [place] guidelines reviewed with patient.  - Reminded pt of extreme importance of social distancing; wearing a mask when out in public; insensate handwashing and cleaning of surfaces, avoiding unnecessary trips for shopping and avoiding ALL but emergency appts etc. - Told patient to be prepared, not scared; and be smart for the sake of others - Patient will call with any additional concerns   Elevated BP Reading without Diagnosis of Hypertension;  Personal Hx Hypertension - Extensively discussed risks of high blood pressure with patient today. - Reviewed additional risk factors such as family history of early CAD. - Discussed critical importance of controlling BP to reduce health risks.  - Patient emphatically declines medication today. - Says "I'm not going to do medicines.  I know what the problem is; it's my back." - Per pt, does not want to deal with ramifications at his job due to new diagnoses/medications b/c he has [employer] license.  - Blood pressure in office today is better at 137/78.  - Counseled patient extensively on pathophysiology of disease and discussed various treatment options, which always includes dietary and lifestyle modification as first line.   - Lifestyle changes such as dash and heart healthy diets and engaging in a regular exercise program discussed extensively with  patient.   - Ambulatory blood pressure monitoring encouraged at least 3 times weekly.  Keep log and bring in every office visit.  Reminded patient that if they ever feel poorly in any way, to check their blood pressure and pulse.  - Handouts provided at patient's desire and/or told to go online at the [employer] website for further information  - We will continue to monitor   Mixed Hyperlipidemia - Began statin 2.5 months ago, taking about one tablet weekly only- NOT AS PRESCRIBED. - Per patient, tolerating statin well.  Denies S-E.  - Advised patient to take one tablet of Crestor every other day as written.  See med list. - Discussed that if patient tolerates well, we can increase dose from there. - Patient will call in if he feels comfortable increasing dose to once daily.  - Encouraged patient to continue hydrating adequately and exercising as much as possible.  - Will continue to monitor and re-check FLP in 4 months as recommended.   BMI Counseling - Body mass index is 31.52 kg/m; Obesity Class I Explained to patient what BMI refers to, and what it means medically.    Told patient to think about it as a "medical risk stratification measurement" and how increasing BMI is associated with increasing risk/ or worsening state of various diseases such as hypertension, hyperlipidemia, diabetes, premature OA, depression etc.  American Heart Association guidelines for healthy diet, basically Mediterranean diet, and exercise guidelines of 30 minutes 5 days per week or more discussed in detail.  Health counseling performed.  All questions  answered.   Lifestyle & Preventative Health Maintenance - Advised patient to continue working toward exercising to improve overall mental, physical, and emotional health.    - Reviewed the "spokes of the wheel" of mood and health management.  Stressed the importance of ongoing prudent habits, including regular exercise, appropriate sleep  hygiene, healthful dietary habits, and prayer/meditation to relax.  - Encouraged patient to engage in daily physical activity, especially a formal exercise routine.  Recommended that the patient eventually strive for at least 150 minutes of moderate cardiovascular activity per week according to guidelines established by the [facility].   - Healthy dietary habits encouraged, including low-carb, and high amounts of lean protein in diet.   - Patient should also consume adequate amounts of water.   Education and routine counseling performed. Handouts provided.   Recommendations - Return in 3-4 months, with fasting blood work three days before. - Patient will continue to follow up with Dr. Micheline Chapman for his back concerns.   Orders Placed This Encounter  Procedures  . Flu Vaccine QUAD 36+ mos IM  . Hemoglobin A1c  . VITAMIN D 25 Hydroxy (Vit-D Deficiency, Fractures)  . Lipid panel  . ALT    Meds ordered this encounter  Medications  . rosuvastatin (CRESTOR) 5 MG tablet    Sig: 1 tab qhs QOD    Dispense:  45 tablet    Refill:  1    Medications Discontinued During This Encounter  Medication Reason  . rosuvastatin (CRESTOR) 5 MG tablet      The patient was counseled, risk factors were discussed, anticipatory guidance given.  Gross side effects, risk and benefits, and alternatives of medications discussed with patient.  Patient is aware that all medications have potential side effects and we are unable to predict every side effect or drug-drug interaction that may occur.  Expresses verbal understanding and consents to current therapy plan and treatment regimen.  Return Chol, vit D def, BP, Pre-DM, for 3 months chronic f/up, with FBW 3 days prior. ( we increase crestor to 1 tab QOD this OV  Please see AVS handed out to patient at the end of our visit for further patient instructions/ counseling done pertaining to today's office visit.    Note:  This document was prepared using Dragon voice  recognition software and may include unintentional dictation errors.  This document serves as a record of services personally performed by Mellody Dance, DO. It was created on her behalf by Toni Amend, a trained medical scribe. The creation of this record is based on the scribe's personal observations and the provider's statements to them.   I have reviewed the above medical documentation for accuracy and completeness and I concur.  Mellody Dance, DO 01/21/2019 2:30 PM       Subjective:    HPI: Danny Ayers is a 58 y.o. male who presents to [place] at [facility] today for follow up of [place].    Says he doesn't want to start BP medications because then he will have to start "jumping through hoops at his job."  However, he has continued taking his Vitamin D, and statin once-weekly.  - Back Pain "I feel like my blood pressure is not an issue once I get this other stuff done."  Says "[my blood pressure] was normal for years, and then I hurt my back and I haven't been able to be active."  Thinks once he gets his back fixed and back on a treadmill, he'll be  fine.  Reiterates several times that his main concern is his back pain.  Recently had his MRI for his back with Dr. Micheline Chapman.  Notes "I'll probably hear form him this week some time; I suspect that the only thing they're going to be able to do is surgery."  Says "I told him about it all" regarding his back symptoms, including sensations of "heat and bee stings."  "I don't know what they'll do now, but they've gotta do something."  Says "I'm losing more than strength; I think I'm losing 'physical man stuff' too."  HTN:  -  His blood pressure has not been optimally controlled at home.  Pt has not been checking it regularly.  Notes checked his BP once or twice, "but it was about the same."  Says he can't remember; "it was either 196 or 169."  - Patient reports good compliance with blood pressure  medications  - Denies medication S-E  - He denies new onset of: chest pain, exercise intolerance, shortness of breath, dizziness, visual changes, headache, lower extremity swelling or claudication.   Last 3 blood pressure readings in our office are as follows: BP Readings from Last 3 Encounters:  01/21/19 137/78  01/03/19 (!) 140/96  12/13/18 (!) 132/92    Pulse Readings from Last 3 Encounters:  01/21/19 79  11/27/18 73  02/21/18 80    Filed Weights   01/21/19 0927  Weight: 219 lb 11.2 oz (99.7 kg)    HPI:  Hyperlipidemia:  58 y.o. male here for cholesterol follow-up.   - Patient reports good compliance with treatment plan of:  medication and/ or lifestyle management.    - States he is occasionally taking a little extra statin. Taking the statin once every Saturday, and a little extra from there.  Says the "cholesterol bugs me more than the blood pressure does, because that seems more ugly."  - Patient denies any acute concerns or problems with management plan .  Confirms pushing non-caffeinated beverages.  - He denies new onset of: myalgias, arthralgias, increased fatigue more than normal, chest pains, exercise intolerance, shortness of breath, dizziness, visual changes, headache, lower extremity swelling or claudication.   Most recent cholesterol panel was:  Lab Results  Component Value Date   CHOL 246 (H) 02/21/2018   HDL 33 (L) 02/21/2018   LDLCALC 167 (H) 02/21/2018   TRIG 229 (H) 02/21/2018   CHOLHDL 7.5 (H) 02/21/2018   Hepatic Function Latest Ref Rng & Units 12/13/2018 02/21/2018  Total Protein 6.0 - 8.5 g/dL 6.7 6.8  Albumin 3.8 - 4.9 g/dL 4.6 4.8  AST 0 - 40 IU/L 27 31  ALT 0 - 44 IU/L 38 48(H)  Alk Phosphatase 39 - 117 IU/L 62 65  Total Bilirubin 0.0 - 1.2 mg/dL 0.5 0.5      Patient Care Team    Relationship Specialty Notifications Start End  Mellody Dance, DO PCP - General Family Medicine  02/21/18      Lab Results  Component Value  Date   CREATININE 1.13 12/13/2018   BUN 12 12/13/2018   NA 138 12/13/2018   K 4.5 12/13/2018   CL 103 12/13/2018   CO2 19 (L) 12/13/2018    Lab Results  Component Value Date   CHOL 246 (H) 02/21/2018    Lab Results  Component Value Date   HDL 33 (L) 02/21/2018    Lab Results  Component Value Date   LDLCALC 167 (H) 02/21/2018    Lab Results  Component Value Date  TRIG 229 (H) 02/21/2018    Lab Results  Component Value Date   CHOLHDL 7.5 (H) 02/21/2018    No results found for: LDLDIRECT ===================================================================   Patient Active Problem List   Diagnosis Date Noted  . Elevated blood-pressure reading without diagnosis of hypertension 02/21/2018    Priority: High  . Mixed hyperlipidemia 02/21/2018    Priority: High  . Obesity, Class I, BMI 30-34.9 02/21/2018    Priority: High  . FHx: early coronary artery disease- Dad in 48's, sev others 02/21/2018    Priority: High  . Personal history of primary hypertension 02/21/2018    Priority: High  . Statin intolerance- "severe HA's to all statins/ chol meds" 02/21/2018    Priority: Medium  . Family history of prostate cancer in father 02/21/2018    Priority: Medium  . Gastroesophageal reflux disease 02/21/2018    Priority: Low  . Erectile dysfunction 02/21/2018    Priority: Low  . Chronic bilateral low back pain 02/21/2018    Priority: Low  . Prediabetes 01/21/2019  . Elevated ALT measurement 01/21/2019  . Vitamin D deficiency 01/21/2019  . Lumbar radiculopathy 01/03/2019  . Left hip pain 12/13/2018  . 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/2018  . Degenerative disc disease, lumbar 02/21/2018  . h/o Cervical vertebral fusion 02/21/2018  . Family history of combined hyperlipidemia 02/21/2018  . Family history of essential hypertension 02/21/2018  . h/o B/L Biceps tendon rupture with surgical repair B/L 02/21/2018  . Family history of diabetes mellitus in brother, father/ other relatives  02/21/2018     Past Medical History:  Diagnosis Date  . High cholesterol      Past Surgical History:  Procedure Laterality Date  . BICEPS TENDON REPAIR Bilateral   . NECK SURGERY       Family History  Problem Relation Age of Onset  . Prostate cancer Father   . Coronary artery disease Father   . Diabetes Brother   . Prostate cancer Paternal Uncle   . Diabetes Paternal Grandmother   . Stroke Paternal Aunt      Social History   Substance and Sexual Activity  Drug Use Never  ,  Social History   Substance and Sexual Activity  Alcohol Use Never  . Frequency: Never  ,  Social History   Tobacco Use  Smoking Status Never Smoker  Smokeless Tobacco Never Used  ,    Current Outpatient Medications on File Prior to Visit  Medication Sig Dispense Refill  . aspirin 325 MG tablet Take 81 mg by mouth daily.    . diazepam (VALIUM) 5 MG tablet Take one pill one hour before your MRI You must have someone drive you to the MRI appt 2 tablet 0  . omeprazole (PRILOSEC) 20 MG capsule Take 1 capsule (20 mg total) by mouth daily. 90 capsule 1  . predniSONE (DELTASONE) 10 MG tablet Use as directed per doctors orders. 21 tablet 0  . Vitamin D, Ergocalciferol, (DRISDOL) 1.25 MG (50000 UT) CAPS capsule Take one tablet wkly 12 capsule 3   No current facility-administered medications on file prior to visit.      Allergies  Allergen Reactions  . Niacin And Related Other (See Comments)    Intense heat   . Statins Other (See Comments)    Extreme headaches     Review of Systems:   General:  Denies fever, chills Optho/Auditory:   Denies visual changes, blurred vision Respiratory:   Denies SOB, cough, wheeze, DIB  Cardiovascular:   Denies chest pain,  palpitations, painful respirations Gastrointestinal:   Denies nausea, vomiting, diarrhea.  Endocrine:     Denies new hot or cold intolerance Musculoskeletal:  Denies joint swelling, gait issues, or new unexplained myalgias/  arthralgias Skin:  Denies rash, suspicious lesions  Neurological:    Denies dizziness, unexplained weakness, numbness  Psychiatric/Behavioral:   Denies mood changes  Objective:    Blood pressure 137/78, pulse 79, temperature 97.9 F (36.6 C), resp. rate 14, height _0  (1.778 m), weight 219 lb 11.2 oz (99.7 kg), SpO2 97 %.  Body mass index is 31.52 kg/m.  General: Well Developed, well nourished, and in no acute distress.  HEENT: Normocephalic, atraumatic, pupils equal round reactive to light, neck supple, No carotid bruits, no JVD Skin: Warm and dry, cap RF less 2 sec Cardiac: Regular rate and rhythm, S1, S2 WNL's, no murmurs rubs or gallops Respiratory: ECTA B/L, Not using accessory muscles, speaking in full sentences. NeuroM-Sk: Ambulates w/o assistance, moves ext * 4 w/o difficulty, sensation grossly intact.  Ext: scant edema b/l lower ext Psych: No HI/SI, judgement and insight good, Euthymic mood. Full Affect.

## 2019-01-29 ENCOUNTER — Other Ambulatory Visit: Payer: Self-pay

## 2019-01-29 ENCOUNTER — Emergency Department
Admission: EM | Admit: 2019-01-29 | Discharge: 2019-01-29 | Disposition: A | Discharge status: Home / Self Care | Payer: Managed Care, Other (non HMO) | Attending: Emergency Medicine | Admitting: Emergency Medicine

## 2019-01-29 ENCOUNTER — Encounter: Payer: Self-pay | Admitting: Emergency Medicine

## 2019-01-29 DIAGNOSIS — Y939 Activity, unspecified: Secondary | ICD-10-CM | POA: Insufficient documentation

## 2019-01-29 DIAGNOSIS — Y999 Unspecified external cause status: Secondary | ICD-10-CM | POA: Diagnosis not present

## 2019-01-29 DIAGNOSIS — Z79899 Other long term (current) drug therapy: Secondary | ICD-10-CM | POA: Insufficient documentation

## 2019-01-29 DIAGNOSIS — H9202 Otalgia, left ear: Secondary | ICD-10-CM | POA: Diagnosis present

## 2019-01-29 DIAGNOSIS — S00452A Superficial foreign body of left ear, initial encounter: Secondary | ICD-10-CM

## 2019-01-29 DIAGNOSIS — T162XXA Foreign body in left ear, initial encounter: Secondary | ICD-10-CM | POA: Insufficient documentation

## 2019-01-29 DIAGNOSIS — X58XXXA Exposure to other specified factors, initial encounter: Secondary | ICD-10-CM | POA: Diagnosis not present

## 2019-01-29 DIAGNOSIS — Y929 Unspecified place or not applicable: Secondary | ICD-10-CM | POA: Diagnosis not present

## 2019-01-29 DIAGNOSIS — Z7982 Long term (current) use of aspirin: Secondary | ICD-10-CM | POA: Insufficient documentation

## 2019-01-29 MED ORDER — LIDOCAINE VISCOUS HCL 2 % MT SOLN
5.0000 mL | Freq: Once | OROMUCOSAL | Status: AC
  Administered 2019-01-29: 5 mL via OROMUCOSAL
  Filled 2019-01-29: qty 15

## 2019-01-29 NOTE — ED Notes (Signed)
Pts left ear irrigated with 1mL of viscous lidocaine as per verbal order from Dr Charna Archer. Will Irrigate with warm saline in 15 minutes to flush out ear canal.

## 2019-01-29 NOTE — ED Triage Notes (Signed)
Pt reports he has not been able to hear out of left ear since Monday AM. Pt denies drainage.

## 2019-01-29 NOTE — ED Provider Notes (Signed)
[facility] Emergency Department Provider Note   ____________________________________________   First MD Initiated Contact with Patient 01/29/19 [phone]     (approximate)  I have reviewed the triage vital signs and the nursing notes.   HISTORY  Chief Complaint Hearing Problem    HPI Danny Ayers is a 58 y.o. male with no significant past medical history who presents to the ED complaining of ear pain.  Patient states he first developed pain in his left ear 2 nights ago.  He describes a fullness in the left ear and has been attempting to remove wax with a Q-tip but has been unsuccessful.  Occasionally he has had difficulty hearing, including currently.  He is also attempted to irrigate the area without any improvement in symptoms.  He has not noticed any drainage from the ear or any swelling to his external ear.        Past Medical History:  Diagnosis Date  . High cholesterol     Patient Active Problem List   Diagnosis Date Noted  . Prediabetes 01/21/2019  . Elevated ALT measurement 01/21/2019  . Vitamin D deficiency 01/21/2019  . Lumbar radiculopathy 01/03/2019  . Left hip pain 12/13/2018  . Elevated blood-pressure reading without diagnosis of hypertension 02/21/2018  . Mixed hyperlipidemia 02/21/2018  . Statin intolerance- "severe HA's to all statins/ chol meds" 02/21/2018  . Obesity, Class I, BMI 30-34.9 02/21/2018  . Gastroesophageal reflux disease 02/21/2018  . 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/2018  . Degenerative disc disease, lumbar 02/21/2018  . h/o Cervical vertebral fusion 02/21/2018  . Erectile dysfunction 02/21/2018  . FHx: early coronary artery disease- Dad in 36's, sev others 02/21/2018  . Family history of combined hyperlipidemia 02/21/2018  . Family history of essential hypertension 02/21/2018  . Family history of prostate cancer in father 02/21/2018  . Chronic bilateral low back pain 02/21/2018  . Personal history of primary hypertension  02/21/2018  . h/o B/L Biceps tendon rupture with surgical repair B/L 02/21/2018  . Family history of diabetes mellitus in brother, father/ other relatives 02/21/2018    Past Surgical History:  Procedure Laterality Date  . BICEPS TENDON REPAIR Bilateral   . NECK SURGERY      Prior to Admission medications   Medication Sig Start Date End Date Taking? Authorizing Provider  aspirin 325 MG tablet Take 81 mg by mouth daily.    [provider]  diazepam (VALIUM) 5 MG tablet Take one pill one hour before your MRI You must have someone drive you to the MRI appt 01/08/19   Margaretha Sheffield, Ozzie Hoyle, DO  omeprazole (PRILOSEC) 20 MG capsule Take 1 capsule (20 mg total) by mouth daily. 02/21/18   Opalski, Gavin Pound, DO  predniSONE (DELTASONE) 10 MG tablet Use as directed per doctors orders. 12/13/18   Ralene Cork, DO  rosuvastatin (CRESTOR) 5 MG tablet 1 tab qhs QOD 01/21/19   Opalski, Gavin Pound, DO  Vitamin D, Ergocalciferol, (DRISDOL) 1.25 MG (50000 UT) CAPS capsule Take one tablet wkly 11/27/18   Opalski, Deborah, DO    Allergies Niacin and related and Statins  Family History  Problem Relation Age of Onset  . Prostate cancer Father   . Coronary artery disease Father   . Diabetes Brother   . Prostate cancer Paternal Uncle   . Diabetes Paternal Grandmother   . Stroke Paternal Aunt     Social History Social History   Tobacco Use  . Smoking status: Never Smoker  . Smokeless tobacco: Never Used  Substance Use  Topics  . Alcohol use: Never    Frequency: Never  . Drug use: Never    Review of Systems  Constitutional: No fever/chills Eyes: No visual changes. ENT: No sore throat.  Positive for ear pain. Cardiovascular: Denies chest pain. Respiratory: Denies shortness of breath. Gastrointestinal: No abdominal pain.  No nausea, no vomiting.  No diarrhea.  No constipation. Genitourinary: Negative for dysuria. Musculoskeletal: Negative for back pain. Skin: Negative for rash.  Neurological: Negative for headaches, focal weakness or numbness.  ____________________________________________   PHYSICAL EXAM:  VITAL SIGNS: ED Triage Vitals [01/29/19 0059]  Enc Vitals Group     BP (!) 149/83     Pulse Rate 79     Resp 18     Temp 98.6 F (37 C)     Temp Source Oral     SpO2 99 %     Weight      Height      Head Circumference      Peak Flow      Pain Score      Pain Loc      Pain Edu?      Excl. in [place]?     Constitutional: Alert and oriented. Eyes: Conjunctivae are normal. Head: Atraumatic.  Insect noted to auditory canal on the left, appears similar to a tick. Nose: No congestion/rhinnorhea. Mouth/Throat: Mucous membranes are moist. Neck: Normal ROM Cardiovascular: Normal rate, regular rhythm. Grossly normal heart sounds. Respiratory: Normal respiratory effort.  No retractions. Lungs CTAB. Gastrointestinal: Soft and nontender. No distention. Genitourinary: deferred Musculoskeletal: No lower extremity tenderness nor edema. Neurologic:  Normal speech and language. No gross focal neurologic deficits are appreciated. Skin:  Skin is warm, dry and intact. No rash noted. Psychiatric: Mood and affect are normal. Speech and behavior are normal.  ____________________________________________   LABS (all labs ordered are listed, but only abnormal results are displayed)  Labs Reviewed - No data to display   PROCEDURES  Procedure(s) performed (including Critical Care):  Procedures   ____________________________________________   INITIAL IMPRESSION / ASSESSMENT AND PLAN / ED COURSE       Patient with persistent ear pain as well as difficulty hearing from his left ear for the past 48 hours.  On exam, there appears to be an insect in his ear canal which looks like a tick.  Will apply viscous lidocaine and irrigate to attempt removal.  Following administration of viscous lidocaine, patient's left ear was irrigated and significant amount of wax  cleared.  Insect was not visible in material cleared from patient's ear, however it is also no longer visible in his ear canal, suspect it was removed but not visualized at the time of removal.  He has had significant improvement in symptoms, now states he is pain-free with hearing back to normal.  Will discharge home and counseled to return to the ED for new or worsening symptoms.  Patient agrees with plan.      ____________________________________________   FINAL CLINICAL IMPRESSION(S) / ED DIAGNOSES  Final diagnoses:  Embedded tick of left ear, initial encounter     ED Discharge Orders    None       Note:  This document was prepared using Dragon voice recognition software and may include unintentional dictation errors.   Blake Divine, MD 01/29/19 [phone]

## 2019-02-01 ENCOUNTER — Other Ambulatory Visit: Payer: Self-pay

## 2019-02-01 ENCOUNTER — Emergency Department
Admission: EM | Admit: 2019-02-01 | Discharge: 2019-02-01 | Disposition: A | Discharge status: Home / Self Care | Payer: Managed Care, Other (non HMO) | Attending: Emergency Medicine | Admitting: Emergency Medicine

## 2019-02-01 ENCOUNTER — Emergency Department: Payer: Managed Care, Other (non HMO)

## 2019-02-01 DIAGNOSIS — N23 Unspecified renal colic: Secondary | ICD-10-CM

## 2019-02-01 DIAGNOSIS — N200 Calculus of kidney: Secondary | ICD-10-CM

## 2019-02-01 DIAGNOSIS — R1032 Left lower quadrant pain: Secondary | ICD-10-CM | POA: Diagnosis present

## 2019-02-01 DIAGNOSIS — N131 Hydronephrosis with ureteral stricture, not elsewhere classified: Secondary | ICD-10-CM | POA: Insufficient documentation

## 2019-02-01 DIAGNOSIS — I10 Essential (primary) hypertension: Secondary | ICD-10-CM | POA: Diagnosis not present

## 2019-02-01 LAB — URINALYSIS, COMPLETE (UACMP) WITH MICROSCOPIC
Bacteria, UA: NONE SEEN
Bilirubin Urine: NEGATIVE
Glucose, UA: NEGATIVE mg/dL
Ketones, ur: NEGATIVE mg/dL
Leukocytes,Ua: NEGATIVE
Nitrite: NEGATIVE
Protein, ur: NEGATIVE mg/dL
Specific Gravity, Urine: 1.028 (ref 1.005–1.030)
Squamous Epithelial / HPF: NONE SEEN (ref 0–5)
pH: 5 (ref 5.0–8.0)

## 2019-02-01 LAB — CBC WITH DIFFERENTIAL/PLATELET
Abs Immature Granulocytes: 0.07 10*3/uL (ref 0.00–0.07)
Basophils Absolute: 0.1 10*3/uL (ref 0.0–0.1)
Basophils Relative: 1 %
Eosinophils Absolute: 0.2 10*3/uL (ref 0.0–0.5)
Eosinophils Relative: 2 %
HCT: 44.3 % (ref 39.0–52.0)
Hemoglobin: 15.7 g/dL (ref 13.0–17.0)
Immature Granulocytes: 1 %
Lymphocytes Relative: 17 %
Lymphs Abs: 1.6 10*3/uL (ref 0.7–4.0)
MCH: 31.7 pg (ref 26.0–34.0)
MCHC: 35.4 g/dL (ref 30.0–36.0)
MCV: 89.5 fL (ref 80.0–100.0)
Monocytes Absolute: 0.5 10*3/uL (ref 0.1–1.0)
Monocytes Relative: 5 %
Neutro Abs: 6.8 10*3/uL (ref 1.7–7.7)
Neutrophils Relative %: 74 %
Platelets: 258 10*3/uL (ref 150–400)
RBC: 4.95 MIL/uL (ref 4.22–5.81)
RDW: 11.9 % (ref 11.5–15.5)
WBC: 9.2 10*3/uL (ref 4.0–10.5)
nRBC: 0 % (ref 0.0–0.2)

## 2019-02-01 LAB — COMPREHENSIVE METABOLIC PANEL
ALT: 50 U/L — ABNORMAL HIGH (ref 0–44)
AST: 34 U/L (ref 15–41)
Albumin: 4.6 g/dL (ref 3.5–5.0)
Alkaline Phosphatase: 54 U/L (ref 38–126)
Anion gap: 11 (ref 5–15)
BUN: 20 mg/dL (ref 6–20)
CO2: 23 mmol/L (ref 22–32)
Calcium: 9.5 mg/dL (ref 8.9–10.3)
Chloride: 105 mmol/L (ref 98–111)
Creatinine, Ser: 1.25 mg/dL — ABNORMAL HIGH (ref 0.61–1.24)
GFR calc Af Amer: >60 mL/min (ref >60)
GFR calc non Af Amer: >60 mL/min (ref >60)
Glucose, Bld: 200 mg/dL — ABNORMAL HIGH (ref 70–99)
Potassium: 4.3 mmol/L (ref 3.5–5.1)
Sodium: 139 mmol/L (ref 135–145)
Total Bilirubin: 0.6 mg/dL (ref 0.3–1.2)
Total Protein: 7.3 g/dL (ref 6.5–8.1)

## 2019-02-01 MED ORDER — ONDANSETRON HCL 4 MG/2ML IJ SOLN
4.0000 mg | Freq: Once | INTRAMUSCULAR | Status: AC
  Administered 2019-02-01: 4 mg via INTRAVENOUS
  Filled 2019-02-01: qty 2

## 2019-02-01 MED ORDER — MORPHINE SULFATE (PF) 4 MG/ML IV SOLN
4.0000 mg | Freq: Once | INTRAVENOUS | Status: AC
  Administered 2019-02-01: 4 mg via INTRAVENOUS
  Filled 2019-02-01: qty 1

## 2019-02-01 MED ORDER — IBUPROFEN 600 MG PO TABS: 600.0000 mg | ORAL_TABLET | Freq: Four times a day (QID) | ORAL | 0 refills | Status: DC | PRN

## 2019-02-01 MED ORDER — ONDANSETRON 4 MG PO TBDP: 4.0000 mg | ORAL_TABLET | Freq: Three times a day (TID) | ORAL | 0 refills | Status: DC | PRN

## 2019-02-01 MED ORDER — OXYCODONE-ACETAMINOPHEN 5-325 MG PO TABS
1.0000 | ORAL_TABLET | Freq: Once | ORAL | Status: AC
  Administered 2019-02-01: 1 via ORAL
  Filled 2019-02-01: qty 1

## 2019-02-01 MED ORDER — TAMSULOSIN HCL 0.4 MG PO CAPS
0.4000 mg | ORAL_CAPSULE | Freq: Once | ORAL | Status: AC
  Administered 2019-02-01: 05:00:00 0.4 mg via ORAL
  Filled 2019-02-01: qty 1

## 2019-02-01 MED ORDER — TAMSULOSIN HCL 0.4 MG PO CAPS: 0.4000 mg | ORAL_CAPSULE | Freq: Every day | ORAL | 0 refills | Status: AC

## 2019-02-01 MED ORDER — KETOROLAC TROMETHAMINE 30 MG/ML IJ SOLN
15.0000 mg | Freq: Once | INTRAMUSCULAR | Status: AC
  Administered 2019-02-01: 15 mg via INTRAVENOUS
  Filled 2019-02-01: qty 1

## 2019-02-01 MED ORDER — OXYCODONE-ACETAMINOPHEN 5-325 MG PO TABS: 1.0000 | ORAL_TABLET | ORAL | 0 refills | Status: DC | PRN

## 2019-02-01 NOTE — ED Provider Notes (Signed)
[facility] ([facility] Emergency Department Provider Note  ____________________________________________  Time seen: Approximately 4:36 AM  I have reviewed the triage vital signs and the nursing notes.   HISTORY  Chief Complaint Flank Pain   HPI Danny Ayers is a 58 y.o. male with h/o GERD, obesity, HLD, HTN, pre diabetes who presents for evaluation of L flank pain. Patient had a similar episode last night which resolved. Felt fine all day today. This evening the pain started again. Cramping, severe, located in the R flank radiating to R lower abdominal region associated with nausea. Feels like he needs to urinate, but little urine comes out. No dysuria. Never had kidney stones in the past. No fever, no vomiting, no diarrhea or constipation. No CP or SOB.  Past Medical History:  Diagnosis Date   High cholesterol     Patient Active Problem List   Diagnosis Date Noted   Prediabetes 01/21/2019   Elevated ALT measurement 01/21/2019   Vitamin D deficiency 01/21/2019   Lumbar radiculopathy 01/03/2019   Left hip pain 12/13/2018   Elevated blood-pressure reading without diagnosis of hypertension 02/21/2018   Mixed hyperlipidemia 02/21/2018   Statin intolerance- "severe HA's to all statins/ chol meds" 02/21/2018   Obesity, Class I, BMI 30-34.9 02/21/2018   Gastroesophageal reflux disease 02/21/2018   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/2018   Degenerative disc disease, lumbar 02/21/2018   h/o Cervical vertebral fusion 02/21/2018   Erectile dysfunction 02/21/2018   FHx: early coronary artery disease- Dad in 3550's, sev others 02/21/2018   Family history of combined hyperlipidemia 02/21/2018   Family history of essential hypertension 02/21/2018   Family history of prostate cancer in father 02/21/2018   Chronic bilateral low back pain 02/21/2018   Personal history of primary hypertension 02/21/2018   h/o B/L Biceps tendon rupture with surgical repair B/L 02/21/2018    Family history of diabetes mellitus in brother, father/ other relatives 02/21/2018    Past Surgical History:  Procedure Laterality Date   BICEPS TENDON REPAIR Bilateral    NECK SURGERY      Prior to Admission medications   Medication Sig Start Date End Date Taking? Authorizing Provider  aspirin 325 MG tablet Take 81 mg by mouth daily.    [provider]  diazepam (VALIUM) 5 MG tablet Take one pill one hour before your MRI You must have someone drive you to the MRI appt 01/08/19   Ralene Corkraper, Timothy R, DO  ibuprofen (ADVIL) 600 MG tablet Take 1 tablet (600 mg total) by mouth every 6 (six) hours as needed. 02/01/19   Nita SickleVeronese, [place], MD  omeprazole (PRILOSEC) 20 MG capsule Take 1 capsule (20 mg total) by mouth daily. 02/21/18   Opalski, Deborah, DO  ondansetron (ZOFRAN ODT) 4 MG disintegrating tablet Take 1 tablet (4 mg total) by mouth every 8 (eight) hours as needed. 02/01/19   Nita SickleVeronese, [place], MD  oxyCODONE-acetaminophen (PERCOCET) 5-325 MG tablet Take 1 tablet by mouth every 4 (four) hours as needed. 02/01/19   Nita SickleVeronese, [place], MD  predniSONE (DELTASONE) 10 MG tablet Use as directed per doctors orders. 12/13/18   Ralene Corkraper, Timothy R, DO  rosuvastatin (CRESTOR) 5 MG tablet 1 tab qhs QOD 01/21/19   Opalski, Gavin [place], DO  tamsulosin (FLOMAX) 0.4 MG CAPS capsule Take 1 capsule (0.4 mg total) by mouth daily for 7 days. 02/01/19 02/08/19  Nita SickleVeronese, [place], MD  Vitamin D, Ergocalciferol, (DRISDOL) 1.25 MG (50000 UT) CAPS capsule Take one tablet wkly 11/27/18   Thomasene Lotpalski, Deborah, DO    Allergies  Niacin and related and Statins  Family History  Problem Relation Age of Onset   Prostate cancer Father    Coronary artery disease Father    Diabetes Brother    Prostate cancer Paternal Uncle    Diabetes Paternal Grandmother    Stroke Paternal Aunt     Social History Social History   Tobacco Use   Smoking status: Never Smoker   Smokeless tobacco: Never Used  Substance  Use Topics   Alcohol use: Never    Frequency: Never   Drug use: Never    Review of Systems  Constitutional: Negative for fever. Eyes: Negative for visual changes. ENT: Negative for sore throat. Neck: No neck pain  Cardiovascular: Negative for chest pain. Respiratory: Negative for shortness of breath. Gastrointestinal: Negative for abdominal pain, vomiting or diarrhea. + nausea Genitourinary: Negative for dysuria. + R flank pain Musculoskeletal: Negative for back pain. Skin: Negative for rash. Neurological: Negative for headaches, weakness or numbness. Psych: No SI or HI  ____________________________________________   PHYSICAL EXAM:  VITAL SIGNS: ED Triage Vitals  Enc Vitals Group     BP 02/01/19 0242 135/63     Pulse Rate 02/01/19 0242 76     Resp 02/01/19 0242 20     Temp 02/01/19 0242 98.2 F (36.8 C)     Temp Source 02/01/19 0242 Oral     SpO2 02/01/19 0242 96 %     Weight 02/01/19 0243 220 lb (99.8 kg)     Height 02/01/19 0243  (1.778 m)     Head Circumference --      Peak Flow --      Pain Score 02/01/19 0242 10     Pain Loc --      Pain Edu? --      Excl. in GC? --     Constitutional: Alert and oriented, pacing up and down the room, looks uncomfortable.  HEENT:      Head: Normocephalic and atraumatic.         Eyes: Conjunctivae are normal. Sclera is non-icteric.       Mouth/Throat: Mucous membranes are moist.       Neck: Supple with no signs of meningismus. Cardiovascular: Regular rate and rhythm. No murmurs, gallops, or rubs. 2+ symmetrical distal pulses are present in all extremities. No JVD. Respiratory: Normal respiratory effort. Lungs are clear to auscultation bilaterally. No wheezes, crackles, or rhonchi.  Gastrointestinal: Obese, diffuse right side tenderness, and non distended with positive bowel sounds. No rebound or guarding. Genitourinary: No CVA tenderness. Musculoskeletal: Nontender with normal range of motion in all extremities. No  edema, cyanosis, or erythema of extremities. Neurologic: Normal speech and language. Face is symmetric. Moving all extremities. No gross focal neurologic deficits are appreciated. Skin: Skin is warm, dry and intact. No rash noted. Psychiatric: Mood and affect are normal. Speech and behavior are normal.  ____________________________________________   LABS (all labs ordered are listed, but only abnormal results are displayed)  Labs Reviewed  COMPREHENSIVE METABOLIC PANEL - Abnormal; Notable for the following components:      Result Value   Glucose, Bld 200 (*)    Creatinine, Ser 1.25 (*)    ALT 50 (*)    All other components within normal limits  URINALYSIS, COMPLETE (UACMP) WITH MICROSCOPIC - Abnormal; Notable for the following components:   Color, Urine YELLOW (*)    APPearance CLEAR (*)    Hgb urine dipstick SMALL (*)    All other components within normal limits  CBC WITH DIFFERENTIAL/PLATELET   ____________________________________________  EKG  ED ECG REPORT I, Nita Sickle, the attending physician, personally viewed and interpreted this ECG.  Normal sinus rhythm, rate of 77, normal intervals, normal axis, no ST elevations or depressions, diffuse T wave flattening.  No prior for comparison. ____________________________________________  RADIOLOGY  I have personally reviewed the images performed during this visit and I agree with the Radiologist's read.   Interpretation by Radiologist:  Ct Renal Stone Study  Result Date: 02/01/2019 CLINICAL DATA:  Flank pain radiating to the right groin EXAM: CT ABDOMEN AND PELVIS WITHOUT CONTRAST TECHNIQUE: Multidetector CT imaging of the abdomen and pelvis was performed following the standard protocol without IV contrast. COMPARISON:  None. FINDINGS: Lower chest:  No contributory findings. Hepatobiliary: Hepatic steatosis. Negative for lesion.No evidence of biliary obstruction or stone. Pancreas: Unremarkable. Spleen: Unremarkable.  Adrenals/Urinary Tract: Negative adrenals. Mild right hydroureteronephrosis due to a 4 mm stone in the distal ureter within 2 cm of the UVJ. Punctate left lower pole renal calculus. Unremarkable collapsed bladder. Stomach/Bowel:  No obstruction. No appendicitis. Vascular/Lymphatic: No acute vascular abnormality. No mass or adenopathy. Reproductive:No pathologic findings. Other: No ascites or pneumoperitoneum. Musculoskeletal: No acute abnormalities. Lumbar degenerative disease with advanced disc narrowing at L5-S1. IMPRESSION: 1. Mild right hydroureteronephrosis due to a 4 mm distal ureteral stone. 2. Punctate left renal calculus. 3. Hepatic steatosis. Electronically Signed   By: Marnee Spring M.D.   On: 02/01/2019 04:29     ____________________________________________   PROCEDURES  Procedure(s) performed: None Procedures Critical Care performed:  None ____________________________________________   INITIAL IMPRESSION / ASSESSMENT AND PLAN / ED COURSE   58 y.o. male with h/o GERD, obesity, HLD, HTN, pre diabetes who presents for evaluation of L flank pain.   Ddx kidney stone versus pyelonephritis versus SBO versus gallbladder versus appendicitis.  CT consistent with kidney stone, 4 mm distal ureteral stone at the right UVJ with mild hydroureteronephrosis.  Stable kidney function.  No overlying infection.  Patient was given Flomax, Toradol, morphine, and Zofran.  Will discharge home on ibuprofen, Flomax, Percocet, Zofran, follow-up with urology.  Discussed my standard return precautions.       As part of my medical decision making, I reviewed the following data within the electronic MEDICAL RECORD NUMBER Nursing notes reviewed and incorporated, Labs reviewed , EKG interpreted , Old chart reviewed, Radiograph reviewed , Notes from prior ED visits and [place] Controlled Substance Database   Patient was evaluated in Emergency Department today for the symptoms described in the history of present  illness. Patient was evaluated in the context of the global COVID-19 pandemic, which necessitated consideration that the patient might be at risk for infection with the SARS-CoV-2 virus that causes COVID-19. Institutional protocols and algorithms that pertain to the evaluation of patients at risk for COVID-19 are in a state of rapid change based on information released by regulatory bodies including the CDC and federal and state organizations. These policies and algorithms were followed during the patient's care in the ED.   ____________________________________________   FINAL CLINICAL IMPRESSION(S) / ED DIAGNOSES   Final diagnoses:  Kidney stone  Renal colic      NEW MEDICATIONS STARTED DURING THIS VISIT:  ED Discharge Orders         Ordered    ibuprofen (ADVIL) 600 MG tablet  Every 6 hours PRN     02/01/19 0551    oxyCODONE-acetaminophen (PERCOCET) 5-325 MG tablet  Every 4 hours PRN     02/01/19 0551  tamsulosin (FLOMAX) 0.4 MG CAPS capsule  Daily     02/01/19 0551    ondansetron (ZOFRAN ODT) 4 MG disintegrating tablet  Every 8 hours PRN     02/01/19 0551           Note:  This document was prepared using Dragon voice recognition software and may include unintentional dictation errors.    Rudene Re, MD 02/01/19 [phone]

## 2019-02-01 NOTE — Discharge Instructions (Signed)

## 2019-02-01 NOTE — ED Triage Notes (Signed)
Pt in with co right flank pain, started as nausea yesterday. Today turned into low back pain that radiates to right lower abd and groin. Pt has no hx of kidney stones.

## 2019-02-02 ENCOUNTER — Other Ambulatory Visit: Payer: Self-pay

## 2019-02-02 ENCOUNTER — Emergency Department
Admission: EM | Admit: 2019-02-02 | Discharge: 2019-02-02 | Disposition: A | Discharge status: Home / Self Care | Payer: Managed Care, Other (non HMO) | Attending: Emergency Medicine | Admitting: Emergency Medicine

## 2019-02-02 DIAGNOSIS — R109 Unspecified abdominal pain: Secondary | ICD-10-CM | POA: Diagnosis present

## 2019-02-02 DIAGNOSIS — Z7982 Long term (current) use of aspirin: Secondary | ICD-10-CM | POA: Insufficient documentation

## 2019-02-02 DIAGNOSIS — Z79899 Other long term (current) drug therapy: Secondary | ICD-10-CM | POA: Diagnosis not present

## 2019-02-02 DIAGNOSIS — N23 Unspecified renal colic: Secondary | ICD-10-CM | POA: Diagnosis not present

## 2019-02-02 MED ORDER — OXYCODONE-ACETAMINOPHEN 5-325 MG PO TABS: 1.0000 | ORAL_TABLET | ORAL | 0 refills | Status: DC | PRN

## 2019-02-02 NOTE — ED Notes (Signed)
Introduced myself to pt and made pt aware that blood work and UA was ordered. Pt stated "you might as well go ahead and put one of them catheters in me, not for my urine for my blood. That is what they did the other night." Informed pt that as of now only blood work is ordered and there was no order for an IV. Pt then stated "I am not about to be a damn pin cushion." Angela,RN made aware and pt was informed that he could wait till he got in a rm to see MD to have blood drawn because an IV was not being started in triage d/t no reasoning to start one. Pt is fine with this and was wheeled back out to the lobby. Pt was asking for a cup of water to "take my pain medications." Advised pt that he should wait to see MD before he eats of drinks anything. Pt stated "I need my pain medication and no one is going to st op me from taking them. Earlie Server gave them to me the other night." Angela,RN made aware of this and informed Theadora Rama, Agricultural consultant.

## 2019-02-02 NOTE — ED Triage Notes (Addendum)
Pt arrived via POV with reports of right flank pain radiating to the right groin.  Pt states he was here on Thursday night, states he has a blockage in his kidney and they should have taken care of it on Thursday. Pt states "I can't live on these pills."  Pt states he last took a dose of oxycodone at 130pm.

## 2019-02-02 NOTE — ED Notes (Signed)
ED Provider at bedside. This RN in with pt and wife at the bedside. Pt is upset that his stone has not passed yet. Pt admits he is "high" from the pain meds. Pt has taken 7 narcotic tabs since yesterday. Pt requires more education about PO fluid intake and instead of narcotics to take IBU.

## 2019-02-02 NOTE — ED Notes (Signed)
PT and spouse verbalize d/c instructions and RX given. RN encouraged pt to start on stool softener and increase fluids to decrease risk of constipation from narcotics given. Urine strainer given for home use. PT in NAD, ambulatory. PT unable to sign due to signature pad malfnx

## 2019-02-02 NOTE — Discharge Instructions (Addendum)
Please strain all your urine and try to catch the stone.  Please give Dr. Bernardo Heater, the urologist, a call first thing Monday morning unless your symptoms are gone.  If you pass the stone before then, you can follow-up with him anyway but you do not need to.  Please take the ibuprofen but Dr. Alfred Levins gave you 3 times a day with food, as instructed .  Please use the Percocet 1 pill up to every 4 hours if needed for further pain.  Please return here for fever over 100.9 vomiting and being unable to keep down fluids or worsening pain not controlled by the medication.  Please continue your other medications as well.  Make sure you are drinking plenty of fluid.  Use Colace or other stool softener to help maintain your bowel movements.  Remember the Percocet is very constipating. When you call Dr. Bernardo Heater please let him know that your symptoms include been going on for about a week and the stone was right at the junction with the bladder days ago.  He may want to schedule you for lithotripsy on Thursday if the pain is not better.

## 2019-02-02 NOTE — ED Provider Notes (Signed)
[facility] Emergency Department Provider Note   ____________________________________________   First MD Initiated Contact with Patient 02/02/19 1733     (approximate)  I have reviewed the triage vital signs and the nursing notes.   HISTORY  Chief Complaint Flank Pain   HPISteven Ayers is a 58 y.o. male patient reports pain in the right CVA area and going down into the right side of the groin.  This is the same pain he has been having since he was diagnosed with a kidney stone by Dr. Don Perking.  He reports the pain comes back 3 hours after he takes the Percocet.  He is not really use the ibuprofen.  He is drinking lots of fluid.  He is not running a fever.  He has vomited a couple times but is able to keep down fluids.  He was under the impression a stone would have passed by now but it has not so he came in to be checked again.         Past Medical History:  Diagnosis Date  . High cholesterol     Patient Active Problem List   Diagnosis Date Noted  . Prediabetes 01/21/2019  . Elevated ALT measurement 01/21/2019  . Vitamin D deficiency 01/21/2019  . Lumbar radiculopathy 01/03/2019  . Left hip pain 12/13/2018  . Elevated blood-pressure reading without diagnosis of hypertension 02/21/2018  . Mixed hyperlipidemia 02/21/2018  . Statin intolerance- "severe HA's to all statins/ chol meds" 02/21/2018  . Obesity, Class I, BMI 30-34.9 02/21/2018  . Gastroesophageal reflux disease 02/21/2018  . 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/2018  . Degenerative disc disease, lumbar 02/21/2018  . h/o Cervical vertebral fusion 02/21/2018  . Erectile dysfunction 02/21/2018  . FHx: early coronary artery disease- Dad in 16's, sev others 02/21/2018  . Family history of combined hyperlipidemia 02/21/2018  . Family history of essential hypertension 02/21/2018  . Family history of prostate cancer in father 02/21/2018  . Chronic bilateral low back pain 02/21/2018  . Personal history of  primary hypertension 02/21/2018  . h/o B/L Biceps tendon rupture with surgical repair B/L 02/21/2018  . Family history of diabetes mellitus in brother, father/ other relatives 02/21/2018    Past Surgical History:  Procedure Laterality Date  . BICEPS TENDON REPAIR Bilateral   . NECK SURGERY      Prior to Admission medications   Medication Sig Start Date End Date Taking? Authorizing Provider  aspirin 325 MG tablet Take 81 mg by mouth daily.    [provider]  diazepam (VALIUM) 5 MG tablet Take one pill one hour before your MRI You must have someone drive you to the MRI appt 01/08/19   Ralene Cork, DO  ibuprofen (ADVIL) 600 MG tablet Take 1 tablet (600 mg total) by mouth every 6 (six) hours as needed. 02/01/19   Nita Sickle, MD  omeprazole (PRILOSEC) 20 MG capsule Take 1 capsule (20 mg total) by mouth daily. 02/21/18   Opalski, Deborah, DO  ondansetron (ZOFRAN ODT) 4 MG disintegrating tablet Take 1 tablet (4 mg total) by mouth every 8 (eight) hours as needed. 02/01/19   Nita Sickle, MD  oxyCODONE-acetaminophen (PERCOCET) 5-325 MG tablet Take 1 tablet by mouth every 4 (four) hours as needed. 02/01/19   Nita Sickle, MD  oxyCODONE-acetaminophen (PERCOCET) 5-325 MG tablet Take 1 tablet by mouth every 4 (four) hours as needed for severe pain. 02/02/19 02/02/20  Arnaldo Natal, MD  predniSONE (DELTASONE) 10 MG tablet Use as directed per doctors orders.  12/13/18   Ralene Corkraper, Timothy R, DO  rosuvastatin (CRESTOR) 5 MG tablet 1 tab qhs QOD 01/21/19   Opalski, Gavin [place], DO  tamsulosin (FLOMAX) 0.4 MG CAPS capsule Take 1 capsule (0.4 mg total) by mouth daily for 7 days. 02/01/19 02/08/19  Nita SickleVeronese, [place], MD  Vitamin D, Ergocalciferol, (DRISDOL) 1.25 MG (50000 UT) CAPS capsule Take one tablet wkly 11/27/18   Opalski, Gavin [place], DO    Allergies Niacin and related and Statins  Family History  Problem Relation Age of Onset  . Prostate cancer Father   . Coronary artery  disease Father   . Diabetes Brother   . Prostate cancer Paternal Uncle   . Diabetes Paternal Grandmother   . Stroke Paternal Aunt     Social History Social History   Tobacco Use  . Smoking status: Never Smoker  . Smokeless tobacco: Never Used  Substance Use Topics  . Alcohol use: Never    Frequency: Never  . Drug use: Never    Review of Systems  Constitutional: No fever/chills Eyes: No visual changes. ENT: No sore throat. Cardiovascular: Denies chest pain. Respiratory: Denies shortness of breath. Gastrointestinal: See HPI Genitourinary: Negative for dysuria. Musculoskeletal: Right CVA pain Skin: Negative for rash. Neurological: Negative for headaches, focal weakness  ____________________________________________   PHYSICAL EXAM:  VITAL SIGNS: ED Triage Vitals  Enc Vitals Group     BP 02/02/19 1633 (!) 142/76     Pulse Rate 02/02/19 1633 85     Resp 02/02/19 1633 18     Temp 02/02/19 1633 98.5 F (36.9 C)     Temp Source 02/02/19 1633 Oral     SpO2 02/02/19 1633 94 %     Weight 02/02/19 1632 220 lb (99.8 kg)     Height 02/02/19 1632 5\' 10"  (1.778 m)     Head Circumference --      Peak Flow --      Pain Score 02/02/19 1631 0     Pain Loc --      Pain Edu? --      Excl. in GC? --     Constitutional: Alert and oriented. Well appearing and in no acute distress. Eyes: Conjunctivae are normal.  Head: Atraumatic. Nose: No congestion/rhinnorhea. Mouth/Throat: Mucous membranes are moist.  Oropharynx non-erythematous. Neck: No stridor.  Cardiovascular:  Good peripheral circulation. Respiratory: Normal respiratory effort.  No retractions.  Gastrointestinal: Soft and nontender. No distention. No abdominal bruits.  Mild right CVA tenderness. Musculoskeletal: No lower extremity tenderness nor edema.   Neurologic:  Normal speech and language. No gross focal neurologic deficits are appreciated.  Skin:  Skin is warm, dry and intact. No rash noted.    ____________________________________________   LABS (all labs ordered are listed, but only abnormal results are displayed)  Labs Reviewed - No data to display ____________________________________________  EKG   ____________________________________________  RADIOLOGY  ED MD interpretation:   Official radiology report(s): No results found.  ____________________________________________   PROCEDURES  Procedure(s) performed (including Critical Care):  Procedures   ____________________________________________   INITIAL IMPRESSION / ASSESSMENT AND PLAN / ED COURSE Urinalysis is still consistent with a kidney stone.  Patient symptoms are consistent with continued presence of a kidney stone.  At 4 mm it should pass soon.  I advised the patient to return for worse pain fever or unable to keep down fluids or if he is vomiting.  He is to follow-up with Dr. Rob BuntingStorey off if the pain has not resolved by Monday.  I went over this  with him and his wife.              ____________________________________________   FINAL CLINICAL IMPRESSION(S) / ED DIAGNOSES  Final diagnoses:  Ureteral colic     ED Discharge Orders         Ordered    oxyCODONE-acetaminophen (PERCOCET) 5-325 MG tablet  Every 4 hours PRN     02/02/19 1807           Note:  This document was prepared using Dragon voice recognition software and may include unintentional dictation errors.    Nena Polio, MD 02/02/19 [phone]

## 2019-02-06 ENCOUNTER — Telehealth: Payer: Self-pay | Admitting: Family Medicine

## 2019-02-06 ENCOUNTER — Ambulatory Visit (HOSPITAL_COMMUNITY)
Admission: RE | Admit: 2019-02-06 | Discharge: 2019-02-06 | Disposition: A | Discharge status: Home / Self Care | Payer: Managed Care, Other (non HMO) | Source: Other Acute Inpatient Hospital | Attending: Urology | Admitting: Urology

## 2019-02-06 ENCOUNTER — Inpatient Hospital Stay (HOSPITAL_COMMUNITY): Payer: Managed Care, Other (non HMO)

## 2019-02-06 ENCOUNTER — Other Ambulatory Visit: Payer: Self-pay

## 2019-02-06 ENCOUNTER — Inpatient Hospital Stay (HOSPITAL_COMMUNITY): Payer: Managed Care, Other (non HMO) | Admitting: Certified Registered Nurse Anesthetist

## 2019-02-06 ENCOUNTER — Encounter (HOSPITAL_COMMUNITY): Payer: Self-pay

## 2019-02-06 ENCOUNTER — Encounter (HOSPITAL_COMMUNITY)
Admission: RE | Disposition: A | Discharge status: Home / Self Care | Payer: Self-pay | Source: Other Acute Inpatient Hospital | Attending: Urology

## 2019-02-06 ENCOUNTER — Other Ambulatory Visit: Payer: Self-pay | Admitting: Urology

## 2019-02-06 DIAGNOSIS — N135 Crossing vessel and stricture of ureter without hydronephrosis: Secondary | ICD-10-CM | POA: Diagnosis not present

## 2019-02-06 DIAGNOSIS — I10 Essential (primary) hypertension: Secondary | ICD-10-CM | POA: Diagnosis not present

## 2019-02-06 DIAGNOSIS — K219 Gastro-esophageal reflux disease without esophagitis: Secondary | ICD-10-CM | POA: Insufficient documentation

## 2019-02-06 DIAGNOSIS — N201 Calculus of ureter: Secondary | ICD-10-CM

## 2019-02-06 DIAGNOSIS — Z20828 Contact with and (suspected) exposure to other viral communicable diseases: Secondary | ICD-10-CM | POA: Insufficient documentation

## 2019-02-06 HISTORY — PX: CYSTOSCOPY/URETEROSCOPY/HOLMIUM LASER/STENT PLACEMENT: SHX6546

## 2019-02-06 LAB — SARS CORONAVIRUS 2 BY RT PCR (HOSPITAL ORDER, PERFORMED IN ~~LOC~~ HOSPITAL LAB): SARS Coronavirus 2: NEGATIVE

## 2019-02-06 SURGERY — CYSTOSCOPY/URETEROSCOPY/HOLMIUM LASER/STENT PLACEMENT
Anesthesia: General | Site: Ureter | Laterality: Right

## 2019-02-06 MED ORDER — ONDANSETRON HCL 4 MG/2ML IJ SOLN
INTRAMUSCULAR | Status: AC
  Filled 2019-02-06: qty 2

## 2019-02-06 MED ORDER — LACTATED RINGERS IV SOLN
INTRAVENOUS | Status: DC
  Administered 2019-02-06 (×2): via INTRAVENOUS

## 2019-02-06 MED ORDER — ONDANSETRON HCL 4 MG/2ML IJ SOLN
INTRAMUSCULAR | Status: DC | PRN
  Administered 2019-02-06: 4 mg via INTRAVENOUS

## 2019-02-06 MED ORDER — ROCURONIUM BROMIDE 10 MG/ML (PF) SYRINGE
PREFILLED_SYRINGE | INTRAVENOUS | Status: AC
  Filled 2019-02-06: qty 10

## 2019-02-06 MED ORDER — PROPOFOL 10 MG/ML IV BOLUS
INTRAVENOUS | Status: DC | PRN
  Administered 2019-02-06: 200 mg via INTRAVENOUS

## 2019-02-06 MED ORDER — IOHEXOL 300 MG/ML  SOLN
INTRAMUSCULAR | Status: DC | PRN
  Administered 2019-02-06: 10 mL

## 2019-02-06 MED ORDER — LIDOCAINE 2% (20 MG/ML) 5 ML SYRINGE
INTRAMUSCULAR | Status: AC
  Filled 2019-02-06: qty 5

## 2019-02-06 MED ORDER — PHENAZOPYRIDINE HCL 200 MG PO TABS: 200.0000 mg | ORAL_TABLET | Freq: Three times a day (TID) | ORAL | 0 refills | Status: DC | PRN

## 2019-02-06 MED ORDER — CEFAZOLIN SODIUM-DEXTROSE 2-4 GM/100ML-% IV SOLN
2.0000 g | INTRAVENOUS | Status: AC
  Administered 2019-02-06: 19:00:00 2 g via INTRAVENOUS
  Filled 2019-02-06: qty 100

## 2019-02-06 MED ORDER — DEXAMETHASONE SODIUM PHOSPHATE 10 MG/ML IJ SOLN
INTRAMUSCULAR | Status: AC
  Filled 2019-02-06: qty 1

## 2019-02-06 MED ORDER — FENTANYL CITRATE (PF) 100 MCG/2ML IJ SOLN
INTRAMUSCULAR | Status: AC
  Filled 2019-02-06: qty 2

## 2019-02-06 MED ORDER — FENTANYL CITRATE (PF) 100 MCG/2ML IJ SOLN
INTRAMUSCULAR | Status: DC | PRN
  Administered 2019-02-06: 50 ug via INTRAVENOUS

## 2019-02-06 MED ORDER — DEXAMETHASONE SODIUM PHOSPHATE 10 MG/ML IJ SOLN
INTRAMUSCULAR | Status: DC | PRN
  Administered 2019-02-06: 4 mg via INTRAVENOUS

## 2019-02-06 MED ORDER — LIDOCAINE 2% (20 MG/ML) 5 ML SYRINGE
INTRAMUSCULAR | Status: DC | PRN
  Administered 2019-02-06: 100 mg via INTRAVENOUS

## 2019-02-06 MED ORDER — MIDAZOLAM HCL 2 MG/2ML IJ SOLN
INTRAMUSCULAR | Status: AC
  Filled 2019-02-06: qty 2

## 2019-02-06 MED ORDER — FENTANYL CITRATE (PF) 100 MCG/2ML IJ SOLN: 25.0000 ug | INTRAMUSCULAR | Status: DC | PRN

## 2019-02-06 MED ORDER — MIDAZOLAM HCL 5 MG/5ML IJ SOLN
INTRAMUSCULAR | Status: DC | PRN
  Administered 2019-02-06: 2 mg via INTRAVENOUS

## 2019-02-06 MED ORDER — MORPHINE SULFATE (PF) 4 MG/ML IV SOLN: 2.0000 mg | INTRAVENOUS | Status: DC | PRN

## 2019-02-06 MED ORDER — SODIUM CHLORIDE 0.9% FLUSH: 3.0000 mL | Freq: Two times a day (BID) | INTRAVENOUS | Status: DC

## 2019-02-06 MED ORDER — FENTANYL CITRATE (PF) 100 MCG/2ML IJ SOLN: 50.0000 ug | Freq: Once | INTRAMUSCULAR | Status: DC

## 2019-02-06 MED ORDER — ACETAMINOPHEN 500 MG PO TABS
ORAL_TABLET | ORAL | Status: AC
  Administered 2019-02-06: 1000 mg
  Filled 2019-02-06: qty 2

## 2019-02-06 MED ORDER — OXYCODONE-ACETAMINOPHEN 5-325 MG PO TABS: 1.0000 | ORAL_TABLET | ORAL | 0 refills | Status: DC | PRN

## 2019-02-06 MED ORDER — ACETAMINOPHEN 650 MG RE SUPP
650.0000 mg | RECTAL | Status: DC | PRN
  Filled 2019-02-06: qty 1

## 2019-02-06 MED ORDER — SODIUM CHLORIDE 0.9% FLUSH: 3.0000 mL | INTRAVENOUS | Status: DC | PRN

## 2019-02-06 MED ORDER — OXYCODONE HCL 5 MG PO TABS: 5.0000 mg | ORAL_TABLET | ORAL | Status: DC | PRN

## 2019-02-06 MED ORDER — PROPOFOL 10 MG/ML IV BOLUS
INTRAVENOUS | Status: AC
  Filled 2019-02-06: qty 20

## 2019-02-06 MED ORDER — ACETAMINOPHEN 325 MG PO TABS: 650.0000 mg | ORAL_TABLET | ORAL | Status: DC | PRN

## 2019-02-06 MED ORDER — SODIUM CHLORIDE 0.9 % IV SOLN: 250.0000 mL | INTRAVENOUS | Status: DC | PRN

## 2019-02-06 MED ORDER — SODIUM CHLORIDE 0.9 % IR SOLN
Status: DC | PRN
  Administered 2019-02-06 (×2): 3000 mL via INTRAVESICAL

## 2019-02-06 SURGICAL SUPPLY — 27 items
BAG URO CATCHER STRL LF (MISCELLANEOUS) ×3 IMPLANT
BASKET STONE NCOMPASS (UROLOGICAL SUPPLIES) IMPLANT
CATH URET 5FR 28IN OPEN ENDED (CATHETERS) IMPLANT
CATH URET DUAL LUMEN 6-10FR 50 (CATHETERS) ×3 IMPLANT
CLOTH BEACON ORANGE TIMEOUT ST (SAFETY) ×3 IMPLANT
EXTRACTOR STONE NITINOL NGAGE (UROLOGICAL SUPPLIES) ×5 IMPLANT
FIBER LASER FLEXIVA 1000 (UROLOGICAL SUPPLIES) IMPLANT
FIBER LASER FLEXIVA 365 (UROLOGICAL SUPPLIES) IMPLANT
FIBER LASER FLEXIVA 550 (UROLOGICAL SUPPLIES) IMPLANT
FIBER LASER TRAC TIP (UROLOGICAL SUPPLIES) IMPLANT
GLOVE SURG SS PI 8.0 STRL IVOR (GLOVE) IMPLANT
GOWN STRL REUS W/TWL XL LVL3 (GOWN DISPOSABLE) ×3 IMPLANT
GUIDEWIRE STR DUAL SENSOR (WIRE) ×3 IMPLANT
IV NS 1000ML (IV SOLUTION) ×3
IV NS 1000ML BAXH (IV SOLUTION) ×1 IMPLANT
IV NS IRRIG 3000ML ARTHROMATIC (IV SOLUTION) ×3 IMPLANT
KIT BALLN UROMAX 15FX4 (MISCELLANEOUS) IMPLANT
KIT BALLN UROMAX 26 75X4 (MISCELLANEOUS) ×2
KIT TURNOVER KIT A (KITS) IMPLANT
MANIFOLD NEPTUNE II (INSTRUMENTS) ×3 IMPLANT
PACK CYSTO (CUSTOM PROCEDURE TRAY) ×3 IMPLANT
SHEATH URETERAL 12FRX35CM (MISCELLANEOUS) ×3 IMPLANT
STENT CONTOUR 6FRX26X.038 (STENTS) ×2 IMPLANT
STENT URETERAL METAL 6X26 (STENTS) IMPLANT
TUBING CONNECTING 10 (TUBING) ×2 IMPLANT
TUBING CONNECTING 10' (TUBING) ×1
TUBING UROLOGY SET (TUBING) ×3 IMPLANT

## 2019-02-06 NOTE — Anesthesia Procedure Notes (Signed)
Procedure Name: LMA Insertion Date/Time: 02/06/2019 7:17 PM Performed by: Montel Clock, CRNA Pre-anesthesia Checklist: Patient identified, Emergency Drugs available, Suction available, Patient being monitored and Timeout performed Patient Re-evaluated:Patient Re-evaluated prior to induction Oxygen Delivery Method: Circle system utilized Preoxygenation: Pre-oxygenation with 100% oxygen Induction Type: IV induction LMA: LMA with gastric port inserted Tube type: Oral Tube size: 4.0 mm Number of attempts: 1 Placement Confirmation: positive ETCO2 and breath sounds checked- equal and bilateral Tube secured with: Tape Dental Injury: Teeth and Oropharynx as per pre-operative assessment

## 2019-02-06 NOTE — H&P (Signed)
CC: I have ureteral stone.  HPI: Danny Ayers is a 58 year-old male established patient who is here for ureteral stone.  The problem is on the right side.   Danny Ayers returns today in f/u for the 29m right distal ureteral stone found on CT on 02/01/19. he was seen on 11/2 and was going to do MET. He continues to have right flank and lower abdominal pain that is intermittently severe and has to take the pain medicine regularly. He has nausea without vomiting. He has some constipation. He has been straining.      ALLERGIES: Statins    MEDICATIONS: Tamsulosin Hcl 0.4 mg capsule 1 capsule PO Daily  Hydromorphone Hcl 2 mg tablet 1 tablet PO Q 4 H PRN Take 1 tab PO as needed for severe kidney stone pain  Ibuprofen 600 mg tablet  Senna-Docusate Sodium 8.6 mg-50 mg tablet 1 tablet PO Daily  Zofran 4 mg tablet     GU PSH: None   NON-GU PSH: None   GU PMH: Ureteral calculus, Discussed management options with patient which include trying a different pain medication versus stents/ureteroscopic stone extraction today. Patient is trying to avoid surgery so we will try hydromorphone for pain control. I have given him some or stool softeners on recommend he continue with the fluids. He is going to follow-up in 3 days for reassessment and at that point if he is not doing any better we may schedule him for surgery. Patient was agreeable with that plan. - 02/04/2019    NON-GU PMH: GERD Hypercholesterolemia Hypertension    FAMILY HISTORY: Prostate Cancer - Uncle, Father   SOCIAL HISTORY: Marital Status: Married Preferred Language: English; Ethnicity: Not Hispanic Or Latino; Race: White Current Smoking Status: Patient has never smoked.   Tobacco Use Assessment Completed: Used Tobacco in last 30 days? Has never drank.  Drinks 1 caffeinated drink per day.    REVIEW OF SYSTEMS:    GU Review Male:   Patient denies frequent urination, hard to postpone urination, burning/ pain with urination, get up at  night to urinate, leakage of urine, stream starts and stops, trouble starting your stream, have to strain to urinate , erection problems, and penile pain.  Gastrointestinal (Upper):   Patient denies nausea, vomiting, and indigestion/ heartburn.  Gastrointestinal (Lower):   Patient reports constipation. Patient denies diarrhea.  Constitutional:   Patient denies fever, night sweats, weight loss, and fatigue.  Skin:   Patient reports itching. Patient denies skin rash/ lesion.  Eyes:   Patient denies blurred vision and double vision.  Ears/ Nose/ Throat:   Patient denies sore throat and sinus problems.  Hematologic/Lymphatic:   Patient denies swollen glands and easy bruising.  Cardiovascular:   Patient denies leg swelling and chest pains.  Respiratory:   Patient denies shortness of breath and cough.  Endocrine:   Patient denies excessive thirst.  Musculoskeletal:   Patient reports back pain. Patient denies joint pain.  Neurological:   Patient denies headaches and dizziness.  Psychologic:   Patient denies depression and anxiety.   VITAL SIGNS:      02/06/2019 01:14 PM  BP 160/104 mmHg  Pulse 73 /min  Temperature 98.2 F / 36.7 C   MULTI-SYSTEM PHYSICAL EXAMINATION:    Constitutional: Well-nourished. No physical deformities. Normally developed. Good grooming.  Respiratory: Normal breath sounds. No labored breathing, no use of accessory muscles.   Cardiovascular: Normal temperature RRR without murmur  Lymphatic: No enlargement of neck, axillae, groin.  Gastrointestinal: Abdominal tenderness, RCVA and  RLQ, obese. No mass, no rigidity.      PAST DATA REVIEWED:  Source Of History:  Patient  Urine Test Review:   Urinalysis  X-Ray Review: KUB: Reviewed Films. Discussed With Patient.  C.T. Stone Protocol: Reviewed Films. Reviewed Report. Discussed With Patient.     PROCEDURES:         KUB - [id number]  A single view of the abdomen is obtained. There is a 62m calcification in the right pelvis that  is consistent with the stone. He has no other significant bone, gas or soft tissue abnormalities.       Patient confirmed No Neulasta OnPro Device.            Urinalysis Dipstick Dipstick Cont'd  Color: Yellow Bilirubin: Neg mg/dL  Appearance: Clear Ketones: Trace mg/dL  Specific Gravity: 1.025 Blood: Neg ery/uL  pH: 6.0 Protein: Trace mg/dL  Glucose: Neg mg/dL Urobilinogen: 0.2 mg/dL    Nitrites: Neg    Leukocyte Esterase: Neg leu/uL    ASSESSMENT:      ICD-10 Details  1 GU:   Ureteral calculus - N20.1 Right, Worsening - He has persistent pain with with stone and would like to proceed with therapy. I have reviewed the risks of ureteroscopy including bleeding, infection, ureteral injury, need for a stent or secondary procedures, thrombotic events and anesthetic complications.    PLAN:           Orders X-Rays: KUB          Schedule Return Visit/Planned Activity: ASAP - Schedule Surgery          Document Letter(s):  Created for Patient: Clinical Summary         Notes:   CC: Dr. DMellody Dance

## 2019-02-06 NOTE — Discharge Instructions (Addendum)
General Anesthesia, Adult, Care After °This sheet gives you information about how to care for yourself after your procedure. Your health care provider may also give you more specific instructions. If you have problems or questions, contact your health care provider. °What can I expect after the procedure? °After the procedure, the following side effects are common: °· Pain or discomfort at the IV site. °· Nausea. °· Vomiting. °· Sore throat. °· Trouble concentrating. °· Feeling cold or chills. °· Weak or tired. °· Sleepiness and fatigue. °· Soreness and body aches. These side effects can affect parts of the body that were not involved in surgery. °Follow these instructions at home: ° °For at least 24 hours after the procedure: °· Have a responsible adult stay with you. It is important to have someone help care for you until you are awake and alert. °· Rest as needed. °· Do not: °? Participate in activities in which you could fall or become injured. °? Drive. °? Use heavy machinery. °? Drink alcohol. °? Take sleeping pills or medicines that cause drowsiness. °? Make important decisions or sign legal documents. °? Take care of children on your own. °Eating and drinking °· Follow any instructions from your health care provider about eating or drinking restrictions. °· When you feel hungry, start by eating small amounts of foods that are soft and easy to digest (bland), such as toast. Gradually return to your regular diet. °· Drink enough fluid to keep your urine pale yellow. °· If you vomit, rehydrate by drinking water, juice, or clear broth. °General instructions °· If you have sleep apnea, surgery and certain medicines can increase your risk for breathing problems. Follow instructions from your health care provider about wearing your sleep device: °? Anytime you are sleeping, including during daytime naps. °? While taking prescription pain medicines, sleeping medicines, or medicines that make you drowsy. °· Return to  your normal activities as told by your health care provider. Ask your health care provider what activities are safe for you. °· Take over-the-counter and prescription medicines only as told by your health care provider. °· If you smoke, do not smoke without supervision. °· Keep all follow-up visits as told by your health care provider. This is important. °Contact a health care provider if: °· You have nausea or vomiting that does not get better with medicine. °· You cannot eat or drink without vomiting. °· You have pain that does not get better with medicine. °· You are unable to pass urine. °· You develop a skin rash. °· You have a fever. °· You have redness around your IV site that gets worse. °Get help right away if: °· You have difficulty breathing. °· You have chest pain. °· You have blood in your urine or stool, or you vomit blood. °Summary °· After the procedure, it is common to have a sore throat or nausea. It is also common to feel tired. °· Have a responsible adult stay with you for the first 24 hours after general anesthesia. It is important to have someone help care for you until you are awake and alert. °· When you feel hungry, start by eating small amounts of foods that are soft and easy to digest (bland), such as toast. Gradually return to your regular diet. °· Drink enough fluid to keep your urine pale yellow. °· Return to your normal activities as told by your health care provider. Ask your health care provider what activities are safe for you. °This information is not   intended to replace advice given to you by your health care provider. Make sure you discuss any questions you have with your health care provider. Document Released: 06/27/2000 Document Revised: 03/24/2017 Document Reviewed: 11/04/2016 Elsevier Patient Education  [place]. Ureteral Stent Implantation, Care After This sheet gives you information about how to care for yourself after your procedure. Your health care  provider may also give you more specific instructions. If you have problems or questions, contact your health care provider. What can I expect after the procedure? After the procedure, it is common to have:  Nausea.  Mild pain when you urinate. You may feel this pain in your lower back or lower abdomen. The pain should stop within a few minutes after you urinate. This may last for up to 1 week.  A small amount of blood in your urine for several days. Follow these instructions at home: Medicines  Take over-the-counter and prescription medicines only as told by your health care provider.  If you were prescribed an antibiotic medicine, take it as told by your health care provider. Do not stop taking the antibiotic even if you start to feel better.  Do not drive for 24 hours if you were given a sedative during your procedure.  Ask your health care provider if the medicine prescribed to you requires you to avoid driving or using heavy machinery. Activity  Rest as told by your health care provider.  Avoid sitting for a long time without moving. Get up to take short walks every 1-2 hours. This is important to improve blood flow and breathing. Ask for help if you feel weak or unsteady.  Return to your normal activities as told by your health care provider. Ask your health care provider what activities are safe for you. General instructions   Watch for any blood in your urine. Call your health care provider if the amount of blood in your urine increases.  If you have a catheter: ? Follow instructions from your health care provider about taking care of your catheter and collection bag. ? Do not take baths, swim, or use a hot tub until your health care provider approves. Ask your health care provider if you may take showers. You may only be allowed to take sponge baths.  Drink enough fluid to keep your urine pale yellow.  Do not use any products that contain nicotine or tobacco, such as  cigarettes, e-cigarettes, and chewing tobacco. These can delay healing after surgery. If you need help quitting, ask your health care provider.  Keep all follow-up visits as told by your health care provider. This is important. Contact a health care provider if:  You have pain that gets worse or does not get better with medicine, especially pain when you urinate.  You have difficulty urinating.  You feel nauseous or you vomit repeatedly during a period of more than 2 days after the procedure. Get help right away if:  Your urine is dark red or has blood clots in it.  You are leaking urine (have incontinence).  The end of the stent comes out of your urethra.  You cannot urinate.  You have sudden, sharp, or severe pain in your abdomen or lower back.  You have a fever.  You have swelling or pain in your legs.  You have difficulty breathing. Summary  After the procedure, it is common to have mild pain when you urinate that goes away within a few minutes after you urinate. This may last for  up to 1 week.  Watch for any blood in your urine. Call your health care provider if the amount of blood in your urine increases.  Take over-the-counter and prescription medicines only as told by your health care provider.  Drink enough fluid to keep your urine pale yellow.  You may remove the stent by pulling the attached string on Monday morning.  If you don't feel comfortable doing that, please contact the office to have it done.  This information is not intended to replace advice given to you by your health care provider. Make sure you discuss any questions you have with your health care provider. Document Released: 11/21/2012 Document Revised: 12/26/2017 Document Reviewed: 12/27/2017 Elsevier Patient Education  2020 [employer].

## 2019-02-06 NOTE — Transfer of Care (Signed)
Immediate Anesthesia Transfer of Care Note  Patient: Danny Ayers  Procedure(s) Performed: CYSTOSCOPY; RIGHT URETEROSCOPY; RIGHT URETERAL STENT PLACEMENT; BALLOON DILATION OF URETERAL STRICTURE; BASKET STONE EXTRACTION (Right Ureter)  Patient Location: PACU  Anesthesia Type:General  Level of Consciousness: drowsy  Airway & Oxygen Therapy: Patient Spontanous Breathing and Patient connected to face mask oxygen  Post-op Assessment: Report given to RN and Post -op Vital signs reviewed and stable  Post vital signs: Reviewed and stable  Last Vitals:  Vitals Value Taken Time  BP    Temp    Pulse 80 02/06/19 2001  Resp 11 02/06/19 2001  SpO2 98 % 02/06/19 2001  Vitals shown include unvalidated device data.  Last Pain:  Vitals:   02/06/19 1608  TempSrc: Oral  PainSc: 10-Worst pain ever      Patients Stated Pain Goal: 3 (50/27/74 1287)  Complications: No apparent anesthesia complications

## 2019-02-06 NOTE — Telephone Encounter (Signed)
Patient called states last week woke up w/ severe side pain went to [facility] for treatment ( notes in Epic)  --CT scan found kidney stone & he was sent to [facility] ( Mon-11/2) for evaluation & treatment (per patient he thought OV notes were sent to PCP for review)  --Pt states was placed on 600 MG Tylenol & appt made to return 11/5--- Per patient pain so severe he went back to ED (2nd time) advised  of a poss different reason for pain.  --Patient has been out of work due to pain & is concerned for financial cost / loss.   ---Forwarding message to medical assistant for review w/provider & to contact patient @ [phone] with advice.  --glh

## 2019-02-06 NOTE — Op Note (Signed)
Procedure: 1.  Cystoscopy with right retrograde pyelogram and interpretation. 2.  Cystoscopy with balloon dilation of right ureteral stricture. 3.  Right ureteroscopic stone extraction. 4.  Insertion of right double-J stent. 5.  Application of fluoroscopy.  Preop diagnosis: Right distal ureteral stone.  Postop diagnosis: Same with right distal ureteral stricture.  Surgeon: Dr. Irine Seal.  Anesthesia: General.  Specimen: Stone.  Drain: 6 [place] by 26 cm contour double-J stent with tether on the right.  EBL: None.  Complications: None.  Indications: The patient is a 58 year old male with a history of a right distal ureteral stone that was initially diagnosed on 02/01/2019 he was seen in the office on 02/04/2019 and an initial trial of medical therapy was advised.  He returned to the office today with persistent pain requiring constant pain medicine and a KUB demonstrated persistence of the right distal ureteral stone.  He is elected to undergo ureteroscopy.  Procedure: He was taken operating room where he was given 2 g of Ancef.  A general anesthetic was induced.  He was placed in lithotomy position and fitted with PAS hose.  His perineum and genitalia were prepped with Betadine solution he was draped in usual sterile fashion.  Cystoscopy was performed using the 23 [place] scope and 30 degree lens.  Examination revealed a normal urethra.  The external sphincter was intact.  The prostatic urethra short without obstruction.  Examination of bladder revealed mild trabeculation.  No mucosal lesions were seen.  Ureteral orifices were unremarkable.  The right ureteral orifice was cannulated with a 5 French opening catheter and contrast was instilled.  The right retrograde pyelogram demonstrated a narrowing of the distal ureter 1/2 to 2 cm above the meatus consistent with a stricture.  There was a filling defect proximal to the stricture consistent with his stone and dilation of the ureter above  the stricture as well.  A sensor guidewire was then advanced through the open-ended catheter to the kidney and the opening catheter was removed.  There was brisk E flux of some thick urine that appeared consistent with degraded old blood.  The cystoscope was removed and the 4-1/2 [place] single-lumen semirigid ureteroscope was then passed alongside the wire.  I was unable to advance the scope beyond the strictured area.  A 4 cm x 15 French high-pressure balloon was then advanced over the wire across the stricture.  The balloon was inflated to 20 atm with contrast and the waist disappeared.  A second more proximal inflation was then performed to ensure disruption of the stricture.  The balloon was then removed.  Ureteroscope was then readvanced alongside the wire.  There was a linear mucosal tear in the area of the prior stricture with a widely patent lumen.  The stone was visualized proximal to this area and was grasped with an engage basket and removed.  Reinspection revealed no residual stone fragments.  The cystoscope was then reinserted over the wire and a 6 [place] by 26 cm contour double-J stent was passed to the kidney under fluoroscopic guidance.  The wire was removed leaving a good coil in the kidney but the distal coil did not appear to be fully formed so the cystoscope was reinserted and a grasping forceps was used to advance the stent out of the ureter a little more to allow the formation of the coil.  The bladder was then drained and the cystoscope was removed leaving the stent string exiting the urethra.  The string was secured to the patient's  penis.  He was taken down from lithotomy position, his anesthetic was reversed and he was moved to recovery in stable condition.  There were no complications.  The stone was given to the patient's wife to bring to the office in follow-up.

## 2019-02-06 NOTE — Interval H&P Note (Signed)
He continues to have pain but it has improved.

## 2019-02-06 NOTE — Anesthesia Preprocedure Evaluation (Addendum)
Anesthesia Evaluation  Patient identified by MRN, date of birth, ID band Patient awake    Reviewed: Allergy & Precautions, NPO status , Patient's Chart, lab work & pertinent test results  Airway Mallampati: II  TM Distance: >3 FB Neck ROM: Full    Dental no notable dental hx. (+) Teeth Intact, Dental Advisory Given   Pulmonary neg pulmonary ROS,    Pulmonary exam normal breath sounds clear to auscultation       Cardiovascular Normal cardiovascular exam Rhythm:Regular Rate:Normal  HLD   Neuro/Psych PSYCHIATRIC DISORDERS Anxiety negative neurological ROS     GI/Hepatic Neg liver ROS, GERD  Medicated and Controlled,  Endo/Other  negative endocrine ROS  Renal/GU negative Renal ROS  negative genitourinary   Musculoskeletal  (+) Arthritis , Osteoarthritis,    Abdominal   Peds  Hematology negative hematology ROS (+)   Anesthesia Other Findings Right distal ureteral stone  Reproductive/Obstetrics                            Anesthesia Physical Anesthesia Plan  ASA: II  Anesthesia Plan: General   Post-op Pain Management:    Induction: Intravenous  PONV Risk Score and Plan: Midazolam, Dexamethasone and Ondansetron  Airway Management Planned: LMA  Additional Equipment:   Intra-op Plan:   Post-operative Plan: Extubation in OR  Informed Consent: I have reviewed the patients History and Physical, chart, labs and discussed the procedure including the risks, benefits and alternatives for the proposed anesthesia with the patient or authorized representative who has indicated his/her understanding and acceptance.     Dental advisory given  Plan Discussed with: CRNA  Anesthesia Plan Comments:        Anesthesia Quick Evaluation

## 2019-02-06 NOTE — Telephone Encounter (Signed)
Spoke with pt, who states that he has an appt with urology today.  Advised pt that he will need to f/u with urologist and follow their recommendations.  Pt expressed understanding and is agreeable.  Charyl Bigger, CMA

## 2019-02-07 ENCOUNTER — Encounter (HOSPITAL_COMMUNITY): Payer: Self-pay | Admitting: Urology

## 2019-02-07 NOTE — Anesthesia Postprocedure Evaluation (Signed)
Anesthesia Post Note  Patient: Danny Ayers  Procedure(s) Performed: CYSTOSCOPY; RIGHT URETEROSCOPY; RIGHT URETERAL STENT PLACEMENT; BALLOON DILATION OF URETERAL STRICTURE; BASKET STONE EXTRACTION (Right Ureter)     Patient location during evaluation: PACU Anesthesia Type: General Level of consciousness: awake and alert Pain management: pain level controlled Vital Signs Assessment: post-procedure vital signs reviewed and stable Respiratory status: spontaneous breathing, nonlabored ventilation, respiratory function stable and patient connected to nasal cannula oxygen Cardiovascular status: blood pressure returned to baseline and stable Postop Assessment: no apparent nausea or vomiting Anesthetic complications: no    Last Vitals:  Vitals:   02/06/19 2027 02/06/19 2102  BP: 138/89 (!) 148/91  Pulse:    Resp: 15 16  Temp: 36.6 C 36.5 C  SpO2: 94% 94%    Last Pain:  Vitals:   02/06/19 2102  TempSrc:   PainSc: 0-No pain                 Tiajuana Amass

## 2019-02-26 ENCOUNTER — Other Ambulatory Visit: Payer: Self-pay | Admitting: Family Medicine

## 2019-02-26 DIAGNOSIS — E782 Mixed hyperlipidemia: Secondary | ICD-10-CM

## 2019-02-26 DIAGNOSIS — Z8249 Family history of ischemic heart disease and other diseases of the circulatory system: Secondary | ICD-10-CM

## 2019-03-07 ENCOUNTER — Telehealth: Payer: Self-pay | Admitting: Family Medicine

## 2019-03-07 NOTE — Telephone Encounter (Signed)
Patient called states several issues but most relevant is Ear pain --Per patient back in October had a tick in his ear( went to [facility] ED) for treatment--pls see ED notes  --Ear pain has returned & he wanted to see provider (no appt available) is wondering if provider can either call him in same Rx gvn by ED provider -- made appt for Mon 12/7 @ 9:00  (With only available KATY) but may not be able to keep.  ---forwarding message to med asst for review w/ provider to see what can bee done.  Plse contact patient w/ any instructions @ [phone].  --glh

## 2019-03-07 NOTE — Telephone Encounter (Signed)
Patient was advised that if he felt he was unable to wait until apt on 03/11/19 ( our next available) that he could go to urgent care for treatment of ear pain. Patient said he did not want to have to pay to go to urgent care or the ED. I advised patient that we only have 1 provider tomorrow for half day and then are closed until Monday morning. Patient was unhappy that we couldn't see him now (at 455pm) and that he may transfer care our of our office. AS, CMA

## 2019-03-11 ENCOUNTER — Ambulatory Visit: Payer: Managed Care, Other (non HMO) | Admitting: Adult Health

## 2019-04-03 ENCOUNTER — Other Ambulatory Visit: Payer: Self-pay | Admitting: Orthopedic Surgery

## 2019-04-19 NOTE — Progress Notes (Signed)
[id number] - [place], [place] [address] Phone: [phone] Fax: [phone]      Your procedure is scheduled on January 21  Report to [facility] Main Entrance "A" at 1130 A.M., and check in at the Admitting office.  Call this number if you have problems the morning of surgery:  [phone]  Call [phone] if you have any questions prior to your surgery date Monday-Friday 8am-4pm    Remember:  Do not eat after midnight the night before your surgery  You may drink clear liquids until 1130 the morning of your surgery.   Clear liquids allowed are: Water, Non-Citrus Juices (without pulp), Carbonated Beverages, Clear Tea, Black Coffee Only, and Gatorade   Enhanced Recovery after Surgery for Orthopedics Enhanced Recovery after Surgery is a protocol used to improve the stress on your body and your recovery after surgery.  Patient Instructions  . The night before surgery:  o No food after midnight. ONLY clear liquids after midnight  .  Marland Kitchen The day of surgery (if you do NOT have diabetes):  o Drink ONE (1) Pre-Surgery Clear Ensure as directed.   o This drink was given to you during your hospital  pre-op appointment visit. o The pre-op nurse will instruct you on the time to drink the  Pre-Surgery Ensure depending on your surgery time. o Finish the drink at the designated time by the pre-op nurse.  o Nothing else to drink after completing the  Pre-Surgery Clear Ensure.         If you have questions, please contact your surgeon's office.     Take these medicines the morning of surgery with A SIP OF WATER  acetaminophen (TYLENOL) if needed rosuvastatin (CRESTOR)  Follow your surgeon's instructions on when to stop Aspirin.  If no instructions were given by your surgeon then you will need to call the office to get those instructions.     7 days prior to surgery STOP taking any Aspirin (unless  otherwise instructed by your surgeon), Aleve, Naproxen, Ibuprofen, Motrin, Advil, Goody's, BC's, all herbal medications, fish oil, and all vitamins.    The Morning of Surgery  Do not wear jewelry  Do not wear lotions, powders, or colognes, or deodorant  Do not shave 48 hours prior to surgery.  Men may shave face and neck.  Do not bring valuables to the hospital.  [facility] is not responsible for any belongings or valuables.  If you are a smoker, DO NOT Smoke 24 hours prior to surgery  If you wear a CPAP at night please bring your mask the morning of surgery   Remember that you must have someone to transport you home after your surgery, and remain with you for 24 hours if you are discharged the same day.   Please bring cases for contacts, glasses, hearing aids, dentures or bridgework because it cannot be worn into surgery.    Leave your suitcase in the car.  After surgery it may be brought to your room.  For patients admitted to the hospital, discharge time will be determined by your treatment team.  Patients discharged the day of surgery will not be allowed to drive home.    Special instructions:   Normal- Preparing For Surgery  Before surgery, you can play an important role. Because skin is not sterile, your skin needs to be as free of germs as possible. You can reduce the number of  germs on your skin by washing with CHG (chlorahexidine gluconate) Soap before surgery.  CHG is an antiseptic cleaner which kills germs and bonds with the skin to continue killing germs even after washing.    Oral Hygiene is also important to reduce your risk of infection.  Remember - BRUSH YOUR TEETH THE MORNING OF SURGERY WITH YOUR REGULAR TOOTHPASTE  Please do not use if you have an allergy to CHG or antibacterial soaps. If your skin becomes reddened/irritated stop using the CHG.  Do not shave (including legs and underarms) for at least 48 hours prior to first CHG shower. It is OK to shave  your face.  Please follow these instructions carefully.   1. Shower the NIGHT BEFORE SURGERY and the MORNING OF SURGERY with CHG Soap.   2. If you chose to wash your hair, wash your hair first as usual with your normal shampoo.  3. After you shampoo, rinse your hair and body thoroughly to remove the shampoo.  4. Use CHG as you would any other liquid soap. You can apply CHG directly to the skin and wash gently with a scrungie or a clean washcloth.   5. Apply the CHG Soap to your body ONLY FROM THE NECK DOWN.  Do not use on open wounds or open sores. Avoid contact with your eyes, ears, mouth and genitals (private parts). Wash Face and genitals (private parts)  with your normal soap.   6. Wash thoroughly, paying special attention to the area where your surgery will be performed.  7. Thoroughly rinse your body with warm water from the neck down.  8. DO NOT shower/wash with your normal soap after using and rinsing off the CHG Soap.  9. Pat yourself dry with a CLEAN TOWEL.  10. Wear CLEAN PAJAMAS to bed the night before surgery, wear comfortable clothes the morning of surgery  11. Place CLEAN SHEETS on your bed the night of your first shower and DO NOT SLEEP WITH PETS.    Day of Surgery:  Please shower the morning of surgery with the CHG soap Do not apply any deodorants/lotions. Please wear clean clothes to the hospital/surgery center.   Remember to brush your teeth WITH YOUR REGULAR TOOTHPASTE.   Please read over the following fact sheets that you were given.

## 2019-04-22 ENCOUNTER — Encounter (HOSPITAL_COMMUNITY): Payer: Self-pay

## 2019-04-22 ENCOUNTER — Other Ambulatory Visit (HOSPITAL_COMMUNITY)
Admission: RE | Admit: 2019-04-22 | Discharge: 2019-04-22 | Disposition: A | Discharge status: Home / Self Care | Payer: Managed Care, Other (non HMO) | Source: Ambulatory Visit | Attending: Orthopedic Surgery | Admitting: Orthopedic Surgery

## 2019-04-22 ENCOUNTER — Other Ambulatory Visit: Payer: Self-pay

## 2019-04-22 ENCOUNTER — Encounter (HOSPITAL_COMMUNITY)
Admission: RE | Admit: 2019-04-22 | Discharge: 2019-04-22 | Disposition: A | Discharge status: Home / Self Care | Payer: Managed Care, Other (non HMO) | Source: Ambulatory Visit | Attending: Orthopedic Surgery | Admitting: Orthopedic Surgery

## 2019-04-22 DIAGNOSIS — Z01812 Encounter for preprocedural laboratory examination: Secondary | ICD-10-CM | POA: Insufficient documentation

## 2019-04-22 HISTORY — DX: Personal history of urinary calculi: Z87.442

## 2019-04-22 HISTORY — DX: Gastro-esophageal reflux disease without esophagitis: K21.9

## 2019-04-22 HISTORY — DX: Essential (primary) hypertension: I10

## 2019-04-22 LAB — COMPREHENSIVE METABOLIC PANEL
ALT: 49 U/L — ABNORMAL HIGH (ref 0–44)
AST: 28 U/L (ref 15–41)
Albumin: 4.3 g/dL (ref 3.5–5.0)
Alkaline Phosphatase: 51 U/L (ref 38–126)
Anion gap: 9 (ref 5–15)
BUN: 12 mg/dL (ref 6–20)
CO2: 21 mmol/L — ABNORMAL LOW (ref 22–32)
Calcium: 9.4 mg/dL (ref 8.9–10.3)
Chloride: 109 mmol/L (ref 98–111)
Creatinine, Ser: 1.15 mg/dL (ref 0.61–1.24)
GFR calc Af Amer: >60 mL/min (ref >60)
GFR calc non Af Amer: >60 mL/min (ref >60)
Glucose, Bld: 119 mg/dL — ABNORMAL HIGH (ref 70–99)
Potassium: 4.5 mmol/L (ref 3.5–5.1)
Sodium: 139 mmol/L (ref 135–145)
Total Bilirubin: 0.5 mg/dL (ref 0.3–1.2)
Total Protein: 6.7 g/dL (ref 6.5–8.1)

## 2019-04-22 LAB — CBC WITH DIFFERENTIAL/PLATELET
Abs Immature Granulocytes: 0.05 10*3/uL (ref 0.00–0.07)
Basophils Absolute: 0.1 10*3/uL (ref 0.0–0.1)
Basophils Relative: 1 %
Eosinophils Absolute: 0.1 10*3/uL (ref 0.0–0.5)
Eosinophils Relative: 2 %
HCT: 48.3 % (ref 39.0–52.0)
Hemoglobin: 16.9 g/dL (ref 13.0–17.0)
Immature Granulocytes: 1 %
Lymphocytes Relative: 21 %
Lymphs Abs: 1.2 10*3/uL (ref 0.7–4.0)
MCH: 31.4 pg (ref 26.0–34.0)
MCHC: 35 g/dL (ref 30.0–36.0)
MCV: 89.8 fL (ref 80.0–100.0)
Monocytes Absolute: 0.5 10*3/uL (ref 0.1–1.0)
Monocytes Relative: 8 %
Neutro Abs: 3.9 10*3/uL (ref 1.7–7.7)
Neutrophils Relative %: 67 %
Platelets: 266 10*3/uL (ref 150–400)
RBC: 5.38 MIL/uL (ref 4.22–5.81)
RDW: 11.7 % (ref 11.5–15.5)
WBC: 5.8 10*3/uL (ref 4.0–10.5)
nRBC: 0 % (ref 0.0–0.2)

## 2019-04-22 LAB — TYPE AND SCREEN
ABO/RH(D): O POS
Antibody Screen: NEGATIVE

## 2019-04-22 LAB — PROTIME-INR
INR: 0.9 (ref 0.8–1.2)
Prothrombin Time: 11.9 seconds (ref 11.4–15.2)

## 2019-04-22 LAB — URINALYSIS, ROUTINE W REFLEX MICROSCOPIC
Bilirubin Urine: NEGATIVE
Glucose, UA: NEGATIVE mg/dL
Hgb urine dipstick: NEGATIVE
Ketones, ur: NEGATIVE mg/dL
Leukocytes,Ua: NEGATIVE
Nitrite: NEGATIVE
Protein, ur: NEGATIVE mg/dL
Specific Gravity, Urine: 1.019 (ref 1.005–1.030)
pH: 5 (ref 5.0–8.0)

## 2019-04-22 LAB — SURGICAL PCR SCREEN
MRSA, PCR: NEGATIVE
Staphylococcus aureus: NEGATIVE

## 2019-04-22 LAB — ABO/RH: ABO/RH(D): O POS

## 2019-04-22 LAB — APTT: aPTT: 29 seconds (ref 24–36)

## 2019-04-22 NOTE — Progress Notes (Signed)
PCP - Dr. Sharee Holster Cardiologist - denies  Chest x-ray - N/A EKG - 02/01/19 Stress Test - greater than 20 years ago per pt. ECHO - denies Cardiac Cath - denies   Blood Thinner Instructions: N/A Aspirin Instructions: Pt stopped Aspirin months ago.  ERAS Protcol - Yes PRE-SURGERY Ensure or G2-  Pre-surgery Ensure  COVID TEST- to be done today.   Anesthesia review: d  Patient denies shortness of breath, fever, cough and chest pain at PAT appointment   All instructions explained to the patient, with a verbal understanding of the material. Patient agrees to go over the instructions while at home for a better understanding. Patient also instructed to self quarantine after being tested for COVID-19. The opportunity to ask questions was provided.

## 2019-04-22 NOTE — Pre-Procedure Instructions (Signed)
Danny Ayers  04/22/2019    Your procedure is scheduled on Thursday, April 25, 2019 at 2:30 PM.   Report to [facility] Entrance "A" Admitting Office at 11:30 AM.   Call this number if you have problems the morning of surgery: [phone]   Questions prior to day of surgery, please call [phone] between 8 & 4 PM.   Remember:  Do not eat food after midnight Wednesday, 04/24/19.  You may drink clear liquids until 11:30 AM.  Clear liquids allowed are:  Water, Juice (non-citric and without pulp), Carbonated beverages, Clear Tea, Black Coffee only, Plain Jell-O only, Gatorade and Plain Popsicles only   Drink the Pre-Surgery Ensure between 11:15 AM and 11:30 AM day of surgery. Then nothing by mouth after that.    Take these medicines the morning of surgery with A SIP OF WATER: Tylenol - if needed  Do not use NSAIDS (Ibuprofen, Aleve, etc), Aspirin products (BC Powders, Goody's, etc), Multivitamins, Fish Oil or Herbal medications prior to surgery.    Do not wear jewelry.  Do not wear lotions, powders, cologne or deodorant.  Men may shave face and neck.  Do not bring valuables to the hospital.  [facility] is not responsible for any belongings or valuables.  Contacts, dentures or bridgework may not be worn into surgery.  Leave your suitcase in the car.  After surgery it may be brought to your room.  For patients admitted to the hospital, discharge time will be determined by your treatment team.  Patients discharged the day of surgery will not be allowed to drive home.   [place] - Preparing for Surgery  Before surgery, you can play an important role.  Because skin is not sterile, your skin needs to be as free of germs as possible.  You can reduce the number of germs on you skin by washing with CHG (chlorahexidine gluconate) soap before surgery.  CHG is an antiseptic cleaner which kills germs and bonds with the skin to continue killing germs even after washing.  Oral  Hygiene is also important in reducing the risk of infection.  Remember to brush your teeth with your regular toothpaste the morning of surgery.  Please DO NOT use if you have an allergy to CHG or antibacterial soaps.  If your skin becomes reddened/irritated stop using the CHG and inform your nurse when you arrive at Short Stay.  Do not shave (including legs and underarms) for at least 48 hours prior to the first CHG shower.  You may shave your face.  Please follow these instructions carefully:   1.  Shower with CHG Soap the night before surgery and the morning of Surgery.  2.  If you choose to wash your hair, wash your hair first as usual with your normal shampoo.  3.  After you shampoo, rinse your hair and body thoroughly to remove the shampoo. 4.  Use CHG as you would any other liquid soap.  You can apply chg directly to the skin and wash gently with a      scrungie or washcloth.           5.  Apply the CHG Soap to your body ONLY FROM THE NECK DOWN.   Do not use on open wounds or open sores. Avoid contact with your eyes, ears, mouth and genitals (private parts).  Wash genitals (private parts) with your normal soap - do this prior to using CHG soap.  6.  Wash thoroughly, paying special attention  to the area where your surgery will be performed.  7.  Thoroughly rinse your body with warm water from the neck down.  8.  DO NOT shower/wash with your normal soap after using and rinsing off the CHG Soap.  9.  Pat yourself dry with a clean towel.            10.  Wear clean pajamas.            11.  Place clean sheets on your bed the night of your first shower and do not sleep with pets.  Day of Surgery  Shower as above. Do not apply any lotions/deodorants the morning of surgery.   Please wear clean clothes to the hospital. Remember to brush your teeth with toothpaste.   Please read over the fact sheets that you were given.

## 2019-04-23 LAB — NOVEL CORONAVIRUS, NAA (HOSP ORDER, SEND-OUT TO REF LAB; TAT 18-24 HRS): SARS-CoV-2, NAA: NOT DETECTED

## 2019-04-25 ENCOUNTER — Ambulatory Visit (HOSPITAL_COMMUNITY): Payer: Managed Care, Other (non HMO) | Admitting: Certified Registered"

## 2019-04-25 ENCOUNTER — Ambulatory Visit (HOSPITAL_COMMUNITY): Payer: Managed Care, Other (non HMO)

## 2019-04-25 ENCOUNTER — Ambulatory Visit (HOSPITAL_COMMUNITY)
Admission: RE | Disposition: A | Discharge status: Home / Self Care | Payer: Self-pay | Source: Home / Self Care | Attending: Orthopedic Surgery

## 2019-04-25 ENCOUNTER — Encounter (HOSPITAL_COMMUNITY): Payer: Self-pay | Admitting: Orthopedic Surgery

## 2019-04-25 ENCOUNTER — Other Ambulatory Visit: Payer: Self-pay

## 2019-04-25 ENCOUNTER — Ambulatory Visit (HOSPITAL_COMMUNITY)
Admission: RE | Admit: 2019-04-25 | Discharge: 2019-04-25 | Disposition: A | Discharge status: Home / Self Care | Payer: Managed Care, Other (non HMO) | Attending: Orthopedic Surgery | Admitting: Orthopedic Surgery

## 2019-04-25 DIAGNOSIS — Z7982 Long term (current) use of aspirin: Secondary | ICD-10-CM | POA: Insufficient documentation

## 2019-04-25 DIAGNOSIS — K219 Gastro-esophageal reflux disease without esophagitis: Secondary | ICD-10-CM | POA: Insufficient documentation

## 2019-04-25 DIAGNOSIS — M5410 Radiculopathy, site unspecified: Secondary | ICD-10-CM

## 2019-04-25 DIAGNOSIS — Z79899 Other long term (current) drug therapy: Secondary | ICD-10-CM | POA: Insufficient documentation

## 2019-04-25 DIAGNOSIS — M5116 Intervertebral disc disorders with radiculopathy, lumbar region: Secondary | ICD-10-CM | POA: Insufficient documentation

## 2019-04-25 DIAGNOSIS — E7800 Pure hypercholesterolemia, unspecified: Secondary | ICD-10-CM | POA: Insufficient documentation

## 2019-04-25 HISTORY — PX: LUMBAR LAMINECTOMY/DECOMPRESSION MICRODISCECTOMY: SHX5026

## 2019-04-25 SURGERY — LUMBAR LAMINECTOMY/DECOMPRESSION MICRODISCECTOMY
Anesthesia: General | Laterality: Left

## 2019-04-25 MED ORDER — METHOCARBAMOL 500 MG PO TABS: 500.0000 mg | ORAL_TABLET | Freq: Four times a day (QID) | ORAL | 0 refills | Status: DC | PRN

## 2019-04-25 MED ORDER — LIDOCAINE 2% (20 MG/ML) 5 ML SYRINGE
INTRAMUSCULAR | Status: AC
  Filled 2019-04-25: qty 5

## 2019-04-25 MED ORDER — BUPIVACAINE HCL (PF) 0.25 % IJ SOLN
INTRAMUSCULAR | Status: AC
  Filled 2019-04-25: qty 30

## 2019-04-25 MED ORDER — DEXAMETHASONE SODIUM PHOSPHATE 10 MG/ML IJ SOLN
INTRAMUSCULAR | Status: AC
  Filled 2019-04-25: qty 1

## 2019-04-25 MED ORDER — CEFAZOLIN SODIUM-DEXTROSE 2-4 GM/100ML-% IV SOLN
INTRAVENOUS | Status: AC
  Filled 2019-04-25: qty 100

## 2019-04-25 MED ORDER — METHYLENE BLUE 0.5 % INJ SOLN
INTRAVENOUS | Status: AC
  Filled 2019-04-25: qty 10

## 2019-04-25 MED ORDER — FENTANYL CITRATE (PF) 100 MCG/2ML IJ SOLN
INTRAMUSCULAR | Status: AC
  Filled 2019-04-25: qty 2

## 2019-04-25 MED ORDER — ONDANSETRON HCL 4 MG/2ML IJ SOLN
INTRAMUSCULAR | Status: DC | PRN
  Administered 2019-04-25: 4 mg via INTRAVENOUS

## 2019-04-25 MED ORDER — EPINEPHRINE PF 1 MG/ML IJ SOLN
INTRAMUSCULAR | Status: AC
  Filled 2019-04-25: qty 1

## 2019-04-25 MED ORDER — FENTANYL CITRATE (PF) 250 MCG/5ML IJ SOLN
INTRAMUSCULAR | Status: AC
  Filled 2019-04-25: qty 5

## 2019-04-25 MED ORDER — BUPIVACAINE-EPINEPHRINE 0.25% -1:200000 IJ SOLN
INTRAMUSCULAR | Status: DC | PRN
  Administered 2019-04-25: 21 mL

## 2019-04-25 MED ORDER — MIDAZOLAM HCL 2 MG/2ML IJ SOLN
INTRAMUSCULAR | Status: AC
  Filled 2019-04-25: qty 2

## 2019-04-25 MED ORDER — MIDAZOLAM HCL 2 MG/2ML IJ SOLN
INTRAMUSCULAR | Status: DC | PRN
  Administered 2019-04-25: 2 mg via INTRAVENOUS

## 2019-04-25 MED ORDER — BUPIVACAINE LIPOSOME 1.3 % IJ SUSP
INTRAMUSCULAR | Status: DC | PRN
  Administered 2019-04-25: 15 mL

## 2019-04-25 MED ORDER — METHYLPREDNISOLONE ACETATE 40 MG/ML IJ SUSP
INTRAMUSCULAR | Status: DC | PRN
  Administered 2019-04-25: 1 mL

## 2019-04-25 MED ORDER — PHENYLEPHRINE 40 MCG/ML (10ML) SYRINGE FOR IV PUSH (FOR BLOOD PRESSURE SUPPORT)
PREFILLED_SYRINGE | INTRAVENOUS | Status: DC | PRN
  Administered 2019-04-25 (×3): 120 ug via INTRAVENOUS
  Administered 2019-04-25: 40 ug via INTRAVENOUS

## 2019-04-25 MED ORDER — FENTANYL CITRATE (PF) 250 MCG/5ML IJ SOLN
INTRAMUSCULAR | Status: DC | PRN
  Administered 2019-04-25: 50 ug via INTRAVENOUS
  Administered 2019-04-25: 100 ug via INTRAVENOUS

## 2019-04-25 MED ORDER — POVIDONE-IODINE 7.5 % EX SOLN
Freq: Once | CUTANEOUS | Status: DC
  Filled 2019-04-25: qty 118

## 2019-04-25 MED ORDER — ROCURONIUM BROMIDE 10 MG/ML (PF) SYRINGE
PREFILLED_SYRINGE | INTRAVENOUS | Status: DC | PRN
  Administered 2019-04-25: 60 mg via INTRAVENOUS

## 2019-04-25 MED ORDER — DEXAMETHASONE SODIUM PHOSPHATE 10 MG/ML IJ SOLN
INTRAMUSCULAR | Status: DC | PRN
  Administered 2019-04-25: 5 mg via INTRAVENOUS

## 2019-04-25 MED ORDER — THROMBIN (RECOMBINANT) 20000 UNITS EX SOLR
CUTANEOUS | Status: AC
  Filled 2019-04-25: qty 20000

## 2019-04-25 MED ORDER — METHYLPREDNISOLONE SODIUM SUCC 40 MG IJ SOLR
INTRAMUSCULAR | Status: AC
  Filled 2019-04-25: qty 1

## 2019-04-25 MED ORDER — 0.9 % SODIUM CHLORIDE (POUR BTL) OPTIME
TOPICAL | Status: DC | PRN
  Administered 2019-04-25: 2000 mL

## 2019-04-25 MED ORDER — LIDOCAINE 2% (20 MG/ML) 5 ML SYRINGE
INTRAMUSCULAR | Status: DC | PRN
  Administered 2019-04-25: 60 mg via INTRAVENOUS

## 2019-04-25 MED ORDER — PROMETHAZINE HCL 25 MG/ML IJ SOLN: 6.2500 mg | INTRAMUSCULAR | Status: DC | PRN

## 2019-04-25 MED ORDER — METHYLPREDNISOLONE ACETATE 40 MG/ML IJ SUSP
INTRAMUSCULAR | Status: AC
  Filled 2019-04-25: qty 1

## 2019-04-25 MED ORDER — LACTATED RINGERS IV SOLN: INTRAVENOUS | Status: DC

## 2019-04-25 MED ORDER — SUGAMMADEX SODIUM 200 MG/2ML IV SOLN
INTRAVENOUS | Status: DC | PRN
  Administered 2019-04-25: 200 mg via INTRAVENOUS

## 2019-04-25 MED ORDER — PROPOFOL 10 MG/ML IV BOLUS
INTRAVENOUS | Status: DC | PRN
  Administered 2019-04-25: 200 mg via INTRAVENOUS

## 2019-04-25 MED ORDER — METHYLENE BLUE 0.5 % INJ SOLN
INTRAVENOUS | Status: DC | PRN
  Administered 2019-04-25: .2 mL via SUBMUCOSAL

## 2019-04-25 MED ORDER — ROCURONIUM BROMIDE 10 MG/ML (PF) SYRINGE
PREFILLED_SYRINGE | INTRAVENOUS | Status: AC
  Filled 2019-04-25: qty 10

## 2019-04-25 MED ORDER — EPHEDRINE SULFATE-NACL 50-0.9 MG/10ML-% IV SOSY
PREFILLED_SYRINGE | INTRAVENOUS | Status: DC | PRN
  Administered 2019-04-25 (×2): 10 mg via INTRAVENOUS

## 2019-04-25 MED ORDER — FENTANYL CITRATE (PF) 100 MCG/2ML IJ SOLN
25.0000 ug | INTRAMUSCULAR | Status: DC | PRN
  Administered 2019-04-25: 18:00:00 0.5 ug via INTRAVENOUS

## 2019-04-25 MED ORDER — OXYCODONE-ACETAMINOPHEN 5-325 MG PO TABS: 1.0000 | ORAL_TABLET | ORAL | 0 refills | Status: AC | PRN

## 2019-04-25 MED ORDER — BUPIVACAINE LIPOSOME 1.3 % IJ SUSP
20.0000 mL | Freq: Once | INTRAMUSCULAR | Status: DC
  Filled 2019-04-25: qty 20

## 2019-04-25 MED ORDER — ONDANSETRON HCL 4 MG/2ML IJ SOLN
INTRAMUSCULAR | Status: AC
  Filled 2019-04-25: qty 2

## 2019-04-25 MED ORDER — CEFAZOLIN SODIUM-DEXTROSE 2-4 GM/100ML-% IV SOLN
2.0000 g | INTRAVENOUS | Status: AC
  Administered 2019-04-25: 15:00:00 2 g via INTRAVENOUS

## 2019-04-25 MED ORDER — PROPOFOL 10 MG/ML IV BOLUS
INTRAVENOUS | Status: AC
  Filled 2019-04-25: qty 20

## 2019-04-25 MED ORDER — PHENYLEPHRINE 40 MCG/ML (10ML) SYRINGE FOR IV PUSH (FOR BLOOD PRESSURE SUPPORT)
PREFILLED_SYRINGE | INTRAVENOUS | Status: AC
  Filled 2019-04-25: qty 10

## 2019-04-25 MED ORDER — EPHEDRINE 5 MG/ML INJ
INTRAVENOUS | Status: AC
  Filled 2019-04-25: qty 10

## 2019-04-25 MED ORDER — PHENYLEPHRINE HCL-NACL 10-0.9 MG/250ML-% IV SOLN
INTRAVENOUS | Status: DC | PRN
  Administered 2019-04-25: 25 ug/min via INTRAVENOUS

## 2019-04-25 SURGICAL SUPPLY — 81 items
AGENT HMST KT MTR STRL THRMB (HEMOSTASIS)
AGENT HMST MTR 8 SURGIFLO (HEMOSTASIS) ×1
APL SKNCLS STERI-STRIP NONHPOA (GAUZE/BANDAGES/DRESSINGS) ×1
BENZOIN TINCTURE PRP APPL 2/3 (GAUZE/BANDAGES/DRESSINGS) ×2 IMPLANT
BUR PRECISION FLUTE 5.0 (BURR) ×3 IMPLANT
BUR ROUND PRECISION 4.0 (BURR) ×1 IMPLANT
BUR ROUND PRECISION 4.0MM (BURR) ×1
CABLE BIPOLOR RESECTION CORD (MISCELLANEOUS) ×3 IMPLANT
CANISTER SUCT 3000ML PPV (MISCELLANEOUS) ×3 IMPLANT
CARTRIDGE OIL MAESTRO DRILL (MISCELLANEOUS) ×1 IMPLANT
CLOSURE STERI-STRIP 1/2X4 (GAUZE/BANDAGES/DRESSINGS) ×1
CLOSURE WOUND 1/2 X4 (GAUZE/BANDAGES/DRESSINGS)
CLSR STERI-STRIP ANTIMIC 1/2X4 (GAUZE/BANDAGES/DRESSINGS) ×1 IMPLANT
COVER SURGICAL LIGHT HANDLE (MISCELLANEOUS) ×3 IMPLANT
COVER WAND RF STERILE (DRAPES) ×3 IMPLANT
DIFFUSER DRILL AIR PNEUMATIC (MISCELLANEOUS) ×3 IMPLANT
DRAIN CHANNEL 15F RND FF W/TCR (WOUND CARE) IMPLANT
DRAPE POUCH INSTRU U-SHP 10X18 (DRAPES) ×6 IMPLANT
DRAPE SURG 17X23 STRL (DRAPES) ×12 IMPLANT
DURAPREP 26ML APPLICATOR (WOUND CARE) ×3 IMPLANT
ELECT BLADE 4.0 EZ CLEAN MEGAD (MISCELLANEOUS) ×3
ELECT CAUTERY BLADE 6.4 (BLADE) ×3 IMPLANT
ELECT REM PT RETURN 9FT ADLT (ELECTROSURGICAL) ×3
ELECTRODE BLDE 4.0 EZ CLN MEGD (MISCELLANEOUS) ×1 IMPLANT
ELECTRODE REM PT RTRN 9FT ADLT (ELECTROSURGICAL) ×1 IMPLANT
EVACUATOR SILICONE 100CC (DRAIN) IMPLANT
FILTER STRAW FLUID ASPIR (MISCELLANEOUS) ×5 IMPLANT
GAUZE 4X4 16PLY RFD (DISPOSABLE) ×6 IMPLANT
GAUZE SPONGE 4X4 12PLY STRL (GAUZE/BANDAGES/DRESSINGS) ×3 IMPLANT
GLOVE BIO SURGEON STRL SZ7 (GLOVE) ×3 IMPLANT
GLOVE BIO SURGEON STRL SZ8 (GLOVE) ×3 IMPLANT
GLOVE BIOGEL PI IND STRL 7.0 (GLOVE) ×1 IMPLANT
GLOVE BIOGEL PI IND STRL 8 (GLOVE) ×1 IMPLANT
GLOVE BIOGEL PI INDICATOR 7.0 (GLOVE) ×2
GLOVE BIOGEL PI INDICATOR 8 (GLOVE) ×2
GOWN STRL REUS W/ TWL LRG LVL3 (GOWN DISPOSABLE) ×1 IMPLANT
GOWN STRL REUS W/ TWL XL LVL3 (GOWN DISPOSABLE) ×2 IMPLANT
GOWN STRL REUS W/TWL LRG LVL3 (GOWN DISPOSABLE) ×3
GOWN STRL REUS W/TWL XL LVL3 (GOWN DISPOSABLE) ×6
IV CATH 14GX2 1/4 (CATHETERS) ×3 IMPLANT
KIT BASIN OR (CUSTOM PROCEDURE TRAY) ×3 IMPLANT
KIT POSITION SURG JACKSON T1 (MISCELLANEOUS) ×3 IMPLANT
KIT TURNOVER KIT B (KITS) ×3 IMPLANT
NDL 18GX1X1/2 (RX/OR ONLY) (NEEDLE) ×1 IMPLANT
NDL HYPO 25GX1X1/2 BEV (NEEDLE) ×1 IMPLANT
NDL SPNL 18GX3.5 QUINCKE PK (NEEDLE) ×2 IMPLANT
NEEDLE 18GX1X1/2 (RX/OR ONLY) (NEEDLE) ×3 IMPLANT
NEEDLE 22X1 1/2 (OR ONLY) (NEEDLE) ×3 IMPLANT
NEEDLE HYPO 25GX1X1/2 BEV (NEEDLE) ×3 IMPLANT
NEEDLE SPNL 18GX3.5 QUINCKE PK (NEEDLE) ×6 IMPLANT
NS IRRIG 1000ML POUR BTL (IV SOLUTION) ×3 IMPLANT
OIL CARTRIDGE MAESTRO DRILL (MISCELLANEOUS) ×3
PACK LAMINECTOMY ORTHO (CUSTOM PROCEDURE TRAY) ×3 IMPLANT
PACK UNIVERSAL I (CUSTOM PROCEDURE TRAY) ×3 IMPLANT
PAD ARMBOARD 7.5X6 YLW CONV (MISCELLANEOUS) ×6 IMPLANT
PATTIES SURGICAL .5 X.5 (GAUZE/BANDAGES/DRESSINGS) IMPLANT
PATTIES SURGICAL .5 X1 (DISPOSABLE) ×3 IMPLANT
SPOGE SURGIFLO 8M (HEMOSTASIS) ×2
SPONGE INTESTINAL PEANUT (DISPOSABLE) ×3 IMPLANT
SPONGE SURGIFLO 8M (HEMOSTASIS) IMPLANT
SPONGE SURGIFOAM ABS GEL 100 (HEMOSTASIS) ×3 IMPLANT
SPONGE SURGIFOAM ABS GEL SZ50 (HEMOSTASIS) ×3 IMPLANT
STRIP CLOSURE SKIN 1/2X4 (GAUZE/BANDAGES/DRESSINGS) IMPLANT
SURGIFLO W/THROMBIN 8M KIT (HEMOSTASIS) IMPLANT
SUT MNCRL AB 4-0 PS2 18 (SUTURE) ×3 IMPLANT
SUT VIC AB 0 CT1 18XCR BRD 8 (SUTURE) IMPLANT
SUT VIC AB 0 CT1 27 (SUTURE)
SUT VIC AB 0 CT1 27XBRD ANBCTR (SUTURE) IMPLANT
SUT VIC AB 0 CT1 8-18 (SUTURE)
SUT VIC AB 1 CT1 18XCR BRD 8 (SUTURE) ×1 IMPLANT
SUT VIC AB 1 CT1 8-18 (SUTURE) ×3
SUT VIC AB 2-0 CT2 18 VCP726D (SUTURE) ×3 IMPLANT
SYR 20ML LL LF (SYRINGE) ×3 IMPLANT
SYR BULB IRRIGATION 50ML (SYRINGE) ×3 IMPLANT
SYR CONTROL 10ML LL (SYRINGE) ×6 IMPLANT
SYR TB 1ML 25GX5/8 (SYRINGE) ×6 IMPLANT
SYR TB 1ML LUER SLIP (SYRINGE) ×6 IMPLANT
TOWEL GREEN STERILE (TOWEL DISPOSABLE) ×3 IMPLANT
TOWEL GREEN STERILE FF (TOWEL DISPOSABLE) ×3 IMPLANT
WATER STERILE IRR 1000ML POUR (IV SOLUTION) ×3 IMPLANT
YANKAUER SUCT BULB TIP NO VENT (SUCTIONS) ×3 IMPLANT

## 2019-04-25 NOTE — H&P (Signed)
PREOPERATIVE H&P  Chief Complaint: Left leg pain  HPI: Danny Ayers is a 59 y.o. 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 GERD (gastroesophageal reflux disease)   . High cholesterol   . History of kidney stones   . Hypertension    diagnosed years ago, lost weight, no longer on medications   Past Surgical History:  Procedure Laterality Date  . BICEPS TENDON REPAIR Bilateral   . CYSTOSCOPY/URETEROSCOPY/HOLMIUM LASER/STENT PLACEMENT Right 02/06/2019   Procedure: CYSTOSCOPY; RIGHT URETEROSCOPY; RIGHT URETERAL STENT PLACEMENT; BALLOON DILATION OF URETERAL STRICTURE; BASKET STONE EXTRACTION;  Surgeon: Bjorn Pippin, MD;  Location: [facility];  Service: Urology;  Laterality: Right;  . NECK SURGERY    . UPPER GASTROINTESTINAL ENDOSCOPY     Social History   Socioeconomic History  . Marital status: Married    Spouse name: Not on file  . Number of children: Not on file  . Years of education: Not on file  . Highest education level: Not on file  Occupational History  . Not on file  Tobacco Use  . Smoking status: Never Smoker  . Smokeless tobacco: Never Used  Substance and Sexual Activity  . Alcohol use: Not Currently  . Drug use: Never  . Sexual activity: Yes    Partners: Female    Birth control/protection: None  Other Topics Concern  . Not on file  Social History Narrative  . Not on file   Social Determinants of Health   Financial Resource Strain:   . Difficulty of Paying Living Expenses: Not on file  Food Insecurity:   . Worried About Programme researcher, broadcasting/film/video in the Last Year: Not on file  . Ran Out of Food in the Last Year: Not on file  Transportation Needs:   . Lack of Transportation (Medical): Not on file  .  Lack of Transportation (Non-Medical): Not on file  Physical Activity:   . Days of Exercise per Week: Not on file  . Minutes of Exercise per Session: Not on file  Stress:   . Feeling of Stress : Not on file  Social Connections:   . Frequency of Communication with Friends and Family: Not on file  . Frequency of Social Gatherings with Friends and Family: Not on file  . Attends Religious Services: Not on file  . Active Member of Clubs or Organizations: Not on file  . Attends Banker Meetings: Not on file  . Marital Status: Not on file   Family History  Problem Relation Age of Onset  . Prostate cancer Father   . Coronary artery disease Father   . Diabetes Brother   . Prostate cancer Paternal Uncle   . Diabetes Paternal Grandmother   . Stroke Paternal Aunt    Allergies  Allergen Reactions  . Niacin And Related Other (See Comments)    Intense heat   . Statins Other (See Comments)    Extreme headaches  . Tramadol Anxiety    Felt as if he couldn't breathe/panic    . Vicodin [Hydrocodone-Acetaminophen] Anxiety    Felt as if he couldn't breathe/panic    Prior to Admission medications   Medication Sig Start Date End Date Taking? Authorizing Provider  acetaminophen (TYLENOL) 500 MG tablet  Take 500-1,000 mg by mouth every 6 (six) hours as needed (for pain.).   Yes [provider]  aspirin EC 81 MG tablet Take 81 mg by mouth daily.    [provider]  diazepam (VALIUM) 5 MG tablet Take one pill one hour before your MRI You must have someone drive you to the MRI appt Patient not taking: Reported on 04/16/2019 01/08/19   Thurman Coyer, DO  ibuprofen (ADVIL) 600 MG tablet Take 1 tablet (600 mg total) by mouth every 6 (six) hours as needed. Patient not taking: Reported on 04/16/2019 02/01/19   Rudene Re, MD  ondansetron (ZOFRAN ODT) 4 MG disintegrating tablet Take 1 tablet (4 mg total) by mouth every 8 (eight) hours as needed. Patient not taking:  Reported on 04/16/2019 02/01/19   Rudene Re, MD  oxyCODONE-acetaminophen (PERCOCET) 5-325 MG tablet Take 1 tablet by mouth every 4 (four) hours as needed. Patient not taking: Reported on 04/16/2019 02/06/19   Irine Seal, MD  phenazopyridine (PYRIDIUM) 200 MG tablet Take 1 tablet (200 mg total) by mouth 3 (three) times daily as needed for pain. Patient not taking: Reported on 04/16/2019 02/06/19   Irine Seal, MD  predniSONE (DELTASONE) 10 MG tablet Use as directed per doctors orders. Patient not taking: Reported on 04/16/2019 12/13/18   Lilia Argue R, DO  rosuvastatin (CRESTOR) 5 MG tablet TAKE 1/2 TABLET BY MOUTH AT BEDTIME EVERY OTHER DAY Patient taking differently: Take 5 mg by mouth every other day. TAKE 1/2 TABLET BY MOUTH AT BEDTIME EVERY OTHER DAY 02/26/19   Mellody Dance, DO  Vitamin D, Ergocalciferol, (DRISDOL) 1.25 MG (50000 UT) CAPS capsule Take one tablet wkly Patient not taking: Reported on 04/16/2019 11/27/18   Mellody Dance, DO     All other systems have been reviewed and were otherwise negative with the exception of those mentioned in the HPI and as above.  Physical Exam: Vitals:   04/25/19 1136  BP: (!) 153/86  Pulse: 94  Resp: 20  Temp: 98.5 F (36.9 C)  SpO2: 96%    Body mass index is 31.14 kg/m.  General: Alert, no acute distress Cardiovascular: No pedal edema Respiratory: No cyanosis, no use of accessory musculature Skin: No lesions in the area of chief complaint Neurologic: Sensation intact distally Psychiatric: Patient is competent for consent with normal mood and affect Lymphatic: No axillary or cervical lymphadenopathy  MUSCULOSKELETAL: + SLR on the left  Assessment/Plan: LEFT LEG PAIN SECONDARY TO A CENTRAL / LEFT LUMBAR 4-5 [place] for Procedure(s): LEFT SIDED LUMBAR 4-5 MICRODISECTOMY   Norva Karvonen, MD 04/25/2019 1:34 PM

## 2019-04-25 NOTE — Anesthesia Preprocedure Evaluation (Addendum)
Anesthesia Evaluation  Patient identified by MRN, date of birth, ID band Patient awake    Reviewed: Allergy & Precautions, NPO status , Patient's Chart, lab work & pertinent test results  History of Anesthesia Complications Negative for: history of anesthetic complications  Airway Mallampati: II  TM Distance: >3 FB Neck ROM: Full    Dental  (+) Dental Advisory Given, Teeth Intact   Pulmonary neg pulmonary ROS,    Pulmonary exam normal        Cardiovascular hypertension (no longer on meds), (-) anginaNormal cardiovascular exam     Neuro/Psych  Neuromuscular disease (lumbar radiculopathy) negative psych ROS   GI/Hepatic Neg liver ROS, GERD  Controlled and Medicated,  Endo/Other   Obesity Pre-DM   Renal/GU negative Renal ROS     Musculoskeletal  (+) Arthritis ,   Abdominal   Peds  Hematology negative hematology ROS (+)   Anesthesia Other Findings Covid neg 1/18   Reproductive/Obstetrics                            Anesthesia Physical Anesthesia Plan  ASA: II  Anesthesia Plan: General   Post-op Pain Management:    Induction: Intravenous  PONV Risk Score and Plan: 3 and Treatment may vary due to age or medical condition, Ondansetron, Dexamethasone and Midazolam  Airway Management Planned: Oral ETT  Additional Equipment: None  Intra-op Plan:   Post-operative Plan: Extubation in OR  Informed Consent: I have reviewed the patients History and Physical, chart, labs and discussed the procedure including the risks, benefits and alternatives for the proposed anesthesia with the patient or authorized representative who has indicated his/her understanding and acceptance.     Dental advisory given  Plan Discussed with: CRNA and Anesthesiologist  Anesthesia Plan Comments:        Anesthesia Quick Evaluation

## 2019-04-25 NOTE — Anesthesia Procedure Notes (Signed)
Procedure Name: Intubation Date/Time: 04/25/2019 3:25 PM Performed by: Modena Morrow, CRNA Pre-anesthesia Checklist: Patient identified, Emergency Drugs available, Suction available and Patient being monitored Patient Re-evaluated:Patient Re-evaluated prior to induction Oxygen Delivery Method: Circle system utilized Preoxygenation: Pre-oxygenation with 100% oxygen Induction Type: IV induction Ventilation: Mask ventilation without difficulty Laryngoscope Size: Miller and 3 Grade View: Grade I Tube type: Oral Tube size: 7.5 mm Number of attempts: 1 Airway Equipment and Method: Stylet and Oral airway Placement Confirmation: ETT inserted through vocal cords under direct vision,  positive ETCO2 and breath sounds checked- equal and bilateral Secured at: 23 cm Tube secured with: Tape Dental Injury: Teeth and Oropharynx as per pre-operative assessment

## 2019-04-25 NOTE — Transfer of Care (Signed)
Immediate Anesthesia Transfer of Care Note  Patient: Danny Ayers  Procedure(s) Performed: LEFT SIDED LUMBAR 4-5 MICRODISECTOMY (Left )  Patient Location: PACU  Anesthesia Type:General  Level of Consciousness: drowsy and patient cooperative  Airway & Oxygen Therapy: Patient Spontanous Breathing and Patient connected to face mask oxygen  Post-op Assessment: Report given to RN and Post -op Vital signs reviewed and stable  Post vital signs: Reviewed and stable  Last Vitals:  Vitals Value Taken Time  BP 121/73 04/25/19 1712  Temp    Pulse 109 04/25/19 1713  Resp 17 04/25/19 1713  SpO2 93 % 04/25/19 1713  Vitals shown include unvalidated device data.  Last Pain:  Vitals:   04/25/19 1158  TempSrc:   PainSc: 2       Patients Stated Pain Goal: 5 (04/25/19 1158)  Complications: No apparent anesthesia complications

## 2019-04-25 NOTE — Progress Notes (Signed)
Of note, immediately prior to surgery, patient did report to me that he began having increased pain in the right leg about 3 weeks ago.  I did express to him that this does bring up the possibility of an increase in size of his known herniation, or potentially of a new herniation at a different level in his lumbar spine.  I did go on to express to him that I would generally err on the side of postponing his surgery until an updated MRI can be obtained.  I told him that certainly it would be unfortunate if we went ahead with surgery today, and he had ongoing pain in the right leg, and an updated MRI showed new pathology that could have been addressed today.  I told him we should only proceed with surgery if he specifically feels that his left leg pain is unlivable, and if he felt unable to prolong today's surgery by another month.  He did express that he would like to proceed with the surgery due to his left leg pain, and that if his right leg pain were to persist or increase, we would obtain additional imaging in the future after today's surgery.  He does understand that this could potentially result in additional surgery.  He does wish to proceed due to his ongoing left leg pain.  We therefore will proceed as scheduled.

## 2019-04-25 NOTE — Op Note (Signed)
PATIENT NAME: Danny Ayers   MEDICAL RECORD NO.:   [id number]   DATE OF BIRTH: [birth year]   DATE OF PROCEDURE: 04/25/2019                                   OPERATIVE REPORT     PREOPERATIVE DIAGNOSES: 1. Left-sided L5 radiculopathy. 2. Left-sided L4/5 disk herniation causing      compression of the left L5 nerve.   POSTOPERATIVE DIAGNOSES: 1. Left-sided L5 radiculopathy. 2. Left-sided L4/5 disk herniation causing      compression of the left L5 nerve.   PROCEDURES:  Left-sided L4/5 laminotomy with partial facetectomy and removal of herniated left-sided L4/5 disk fragment.   SURGEON:  Phylliss Bob, MD.   ASSISTANTPricilla Holm, PA-C.   ANESTHESIA:  General endotracheal anesthesia.   COMPLICATIONS:  None.   DISPOSITION:  Stable.   ESTIMATED BLOOD LOSS:  Minimal.   INDICATIONS FOR SURGERY:  Briefly, Danny Ayers is a pleasant 59 year old male, who did present to me with severe pain in the left leg.  The patient's MRI did reveal the findings outlined above.  We did proceed with conservative treatment, but he did continue to have ongoing pain. As such, we did ultimately elect to proceed with the procedure reflected above.  The patient was fully made aware of the risks of surgery, including the risk of recurrent herniation and the need for subsequent surgery, including the possibility of a subsequent diskectomy and/or fusion.  Of note, he did complain of increased right leg pain beginning about 3 weeks prior to today.  We did discuss potentially holding off on surgery until additional imaging can be obtained, but he did feel that his left leg pain was severe enough to proceed with the planned surgery.  He does understand that additional imaging and potentially additional surgery may be indicated if his right leg pain were to persist postoperatively.   OPERATIVE DETAILS:  On 04/25/2019, the patient was brought to surgery and general endotracheal anesthesia was administered.   The patient was placed prone on a well-padded flat Jackson bed with a spinal frame.  Antibiotics were given.  The back was prepped and draped and a time-out procedure was performed.  At this point, a midline incision was made directly over the L4/5 intervertebral space.  A curvilinear incision was made just to the left of the midline into the fascia.  A self-retaining McCulloch retractor was placed.  The lamina of L4 and L5 was identified and subperiosteally exposed.  I then removed the lateral aspect of the L4/5 ligamentum flavum.  Readily identified was the traversing left L5 nerve, which was noted to be under obvious tension. I was able to gently gain medial retraction of the nerve.  At this point, an annulotomy was performed at the posterior lateral aspect of the intervertebral disc at L4-5.  Multiple free disc fragments were noted and were removed uneventfully using a micropituitary.  Of particular note, I did use an forward angled micro pituitary just beneath the annulus, angled medially, as there was a very central herniated fragment noted on the MRI.  In doing so, I was able to remove a rather large central herniation located just beneath the annulus. At this point, the traversing left L5 nerve was evaluated, and was noted to be mobile, and entirely free of compression. I was very pleased with the final decompression that I was able to accomplish.  At this point, the wound was copiously irrigated with normal saline.  All epidural bleeding was controlled using bipolar electrocautery in addition to Surgiflo. All bleeding was controlled at the termination of the procedure.  At this point, 30 mg of Depo-Medrol was introduced about the epidural space in the region of the left L5 nerve.  The wound was then closed in layers using #1 Vicryl followed by 2-0 Vicryl, followed by 4-0 Monocryl. Benzoin and Steri-Strips were applied followed by a sterile dressing. All instrument counts were correct at  the termination of the procedure.   Of note, Jason Coop was my assistant throughout surgery, and did aid in retraction, suctioning, and closure from start to finish.     Estill Bamberg, MD

## 2019-04-26 ENCOUNTER — Encounter: Payer: Self-pay | Admitting: *Deleted

## 2019-04-30 NOTE — Anesthesia Postprocedure Evaluation (Signed)
Anesthesia Post Note  Patient: Danny Ayers  Procedure(s) Performed: LEFT SIDED LUMBAR 4-5 MICRODISECTOMY (Left )     Patient location during evaluation: PACU Anesthesia Type: General Level of consciousness: awake and alert Pain management: pain level controlled Vital Signs Assessment: post-procedure vital signs reviewed and stable Respiratory status: spontaneous breathing, nonlabored ventilation, respiratory function stable and patient connected to nasal cannula oxygen Cardiovascular status: blood pressure returned to baseline and stable Postop Assessment: no apparent nausea or vomiting Anesthetic complications: no    Last Vitals:  Vitals:   04/25/19 1812 04/25/19 1823  BP: 112/73 115/75  Pulse: (!) 105 (!) 105  Resp: 14 19  Temp:  36.7 C  SpO2: 94% 95%    Last Pain:  Vitals:   04/29/19 1648  TempSrc:   PainSc: 3                  Tyeshia Cornforth S

## 2019-05-01 MED ORDER — THROMBIN 20000 UNITS EX SOLR
CUTANEOUS | Status: DC | PRN
  Administered 2019-05-01: 20 mL via TOPICAL

## 2019-05-01 MED FILL — Thrombin (Recombinant) For Soln 20000 Unit: CUTANEOUS | Qty: 1 | Status: AC

## 2019-05-26 ENCOUNTER — Other Ambulatory Visit: Payer: Self-pay | Admitting: Family Medicine

## 2019-05-26 DIAGNOSIS — E782 Mixed hyperlipidemia: Secondary | ICD-10-CM

## 2019-05-26 DIAGNOSIS — Z8249 Family history of ischemic heart disease and other diseases of the circulatory system: Secondary | ICD-10-CM

## 2019-07-23 ENCOUNTER — Other Ambulatory Visit: Payer: Self-pay | Admitting: Orthopedic Surgery

## 2019-08-12 NOTE — Progress Notes (Signed)
[id number] [place], [place] - [address] AT [facility] [address] [place] [address] Phone: [phone] Fax: [phone]    Your procedure is scheduled on Wednesday, May 19th.  Report to [facility] Main Entrance "A" at 6:30 A.M., and check in at the Admitting office.  Call this number if you have problems the morning of surgery:  [phone]  Call [phone] if you have any questions prior to your surgery date Monday-Friday 8am-4pm   Remember:  Do not eat after midnight the night before your surgery  You may drink clear liquids until 5:30 A.M. the morning of your surgery.   Clear liquids allowed are: Water, Non-Citrus Juices (without pulp), Carbonated Beverages, Clear Tea, Black Coffee Only, and Gatorade   Enhanced Recovery after Surgery for Orthopedics Enhanced Recovery after Surgery is a protocol used to improve the stress on your body and your recovery after surgery.  Patient Instructions  . The night before surgery:  o No food after midnight. ONLY clear liquids after midnight  .  Marland Kitchen The day of surgery (if you do NOT have diabetes):  o Drink ONE (1) Pre-Surgery Clear Ensure as directed.   o This drink was given to you during your hospital  pre-op appointment visit.  o Finish the drink at by 5:30 A.M. the morning of surgery. o Nothing else to drink after completing the  Pre-Surgery Clear Ensure.   Take these medicines the morning of surgery with A SIP OF WATER  If needed - acetaminophen (TYLENOL)  As of today, STOP taking any Aspirin (unless otherwise instructed by your surgeon) and Aspirin containing products, Aleve, Naproxen, Ibuprofen, Motrin, Advil, Goody's, BC's, all herbal medications, fish oil, and all vitamins.                     Do not wear jewelry.            Do not wear lotions, powders, colognes, or deodorant.            Men may shave face and neck.            Do not bring valuables to the hospital.            [facility] is not responsible for any belongings or valuables.  Do NOT Smoke (Tobacco/Vapping) or drink Alcohol 24 hours prior to your procedure If you use a CPAP at night, you may bring all equipment for your overnight stay.   Contacts, glasses, dentures or bridgework may not be worn into surgery.      For patients admitted to the hospital, discharge time will be determined by your treatment team.   Patients discharged the day of surgery will not be allowed to drive home, and someone needs to stay with them for 24 hours.  Special instructions:   [facility]- Preparing For Surgery  Before surgery, you can play an important role. Because skin is not sterile, your skin needs to be as free of germs as possible. You can reduce the number of germs on your skin by washing with CHG (chlorahexidine gluconate) Soap before surgery.  CHG is an antiseptic cleaner which kills germs and bonds with the skin to continue killing germs even after washing.    Oral Hygiene is also important to reduce your risk of infection.  Remember - BRUSH YOUR TEETH THE MORNING OF SURGERY WITH YOUR REGULAR TOOTHPASTE  Please do not use if you have an allergy to CHG or  antibacterial soaps. If your skin becomes reddened/irritated stop using the CHG.  Do not shave (including legs and underarms) for at least 48 hours prior to first CHG shower. It is OK to shave your face.  Please follow these instructions carefully.   1. Shower the NIGHT BEFORE SURGERY and the MORNING OF SURGERY with CHG Soap.   2. If you chose to wash your hair, wash your hair first as usual with your normal shampoo.  3. After you shampoo, rinse your hair and body thoroughly to remove the shampoo.  4. Use CHG as you would any other liquid soap. You can apply CHG directly to the skin and wash gently with a scrungie or a clean washcloth.   5. Apply the CHG Soap to your body ONLY FROM THE NECK DOWN.  Do not use on open wounds or open sores. Avoid contact with  your eyes, ears, mouth and genitals (private parts). Wash Face and genitals (private parts)  with your normal soap.   6. Wash thoroughly, paying special attention to the area where your surgery will be performed.  7. Thoroughly rinse your body with warm water from the neck down.  8. DO NOT shower/wash with your normal soap after using and rinsing off the CHG Soap.  9. Pat yourself dry with a CLEAN TOWEL.  10. Wear CLEAN PAJAMAS to bed the night before surgery, wear comfortable clothes the morning of surgery  11. Place CLEAN SHEETS on your bed the night of your first shower and DO NOT SLEEP WITH PETS.  Day of Surgery: Shower with CHG soap as instructed above.  Do not apply any deodorants/lotions.  Please wear clean clothes to the hospital/surgery center.   Remember to brush your teeth WITH YOUR REGULAR TOOTHPASTE.   Please read over the following fact sheets that you were given.

## 2019-08-13 ENCOUNTER — Encounter (HOSPITAL_COMMUNITY): Payer: Self-pay

## 2019-08-13 ENCOUNTER — Other Ambulatory Visit: Payer: Self-pay

## 2019-08-13 ENCOUNTER — Encounter (HOSPITAL_COMMUNITY)
Admission: RE | Admit: 2019-08-13 | Discharge: 2019-08-13 | Disposition: A | Discharge status: Home / Self Care | Payer: Managed Care, Other (non HMO) | Source: Ambulatory Visit | Attending: Orthopedic Surgery | Admitting: Orthopedic Surgery

## 2019-08-13 DIAGNOSIS — Z01812 Encounter for preprocedural laboratory examination: Secondary | ICD-10-CM | POA: Insufficient documentation

## 2019-08-13 HISTORY — DX: Spondylosis without myelopathy or radiculopathy, site unspecified: M47.819

## 2019-08-13 LAB — URINALYSIS, ROUTINE W REFLEX MICROSCOPIC
Bilirubin Urine: NEGATIVE
Glucose, UA: NEGATIVE mg/dL
Hgb urine dipstick: NEGATIVE
Ketones, ur: NEGATIVE mg/dL
Leukocytes,Ua: NEGATIVE
Nitrite: NEGATIVE
Protein, ur: NEGATIVE mg/dL
Specific Gravity, Urine: 1.023 (ref 1.005–1.030)
pH: 5 (ref 5.0–8.0)

## 2019-08-13 LAB — PROTIME-INR
INR: 0.9 (ref 0.8–1.2)
Prothrombin Time: 11.8 seconds (ref 11.4–15.2)

## 2019-08-13 LAB — COMPREHENSIVE METABOLIC PANEL WITH GFR
ALT: 61 U/L — ABNORMAL HIGH (ref 0–44)
AST: 37 U/L (ref 15–41)
Albumin: 4.4 g/dL (ref 3.5–5.0)
Alkaline Phosphatase: 58 U/L (ref 38–126)
Anion gap: 10 (ref 5–15)
BUN: 13 mg/dL (ref 6–20)
CO2: 24 mmol/L (ref 22–32)
Calcium: 9.6 mg/dL (ref 8.9–10.3)
Chloride: 106 mmol/L (ref 98–111)
Creatinine, Ser: 1.25 mg/dL — ABNORMAL HIGH (ref 0.61–1.24)
GFR calc Af Amer: >60 mL/min
GFR calc non Af Amer: >60 mL/min
Glucose, Bld: 126 mg/dL — ABNORMAL HIGH (ref 70–99)
Potassium: 4 mmol/L (ref 3.5–5.1)
Sodium: 140 mmol/L (ref 135–145)
Total Bilirubin: 1 mg/dL (ref 0.3–1.2)
Total Protein: 7.2 g/dL (ref 6.5–8.1)

## 2019-08-13 LAB — CBC WITH DIFFERENTIAL/PLATELET
Abs Immature Granulocytes: 0.04 10*3/uL (ref 0.00–0.07)
Basophils Absolute: 0.1 10*3/uL (ref 0.0–0.1)
Basophils Relative: 1 %
Eosinophils Absolute: 0.2 10*3/uL (ref 0.0–0.5)
Eosinophils Relative: 4 %
HCT: 52 % (ref 39.0–52.0)
Hemoglobin: 17.6 g/dL — ABNORMAL HIGH (ref 13.0–17.0)
Immature Granulocytes: 1 %
Lymphocytes Relative: 25 %
Lymphs Abs: 1.4 10*3/uL (ref 0.7–4.0)
MCH: 30.8 pg (ref 26.0–34.0)
MCHC: 33.8 g/dL (ref 30.0–36.0)
MCV: 91.1 fL (ref 80.0–100.0)
Monocytes Absolute: 0.5 10*3/uL (ref 0.1–1.0)
Monocytes Relative: 8 %
Neutro Abs: 3.4 10*3/uL (ref 1.7–7.7)
Neutrophils Relative %: 61 %
Platelets: 264 10*3/uL (ref 150–400)
RBC: 5.71 MIL/uL (ref 4.22–5.81)
RDW: 11.9 % (ref 11.5–15.5)
WBC: 5.5 10*3/uL (ref 4.0–10.5)
nRBC: 0 % (ref 0.0–0.2)

## 2019-08-13 LAB — SURGICAL PCR SCREEN
MRSA, PCR: NEGATIVE
Staphylococcus aureus: NEGATIVE

## 2019-08-13 LAB — TYPE AND SCREEN
ABO/RH(D): O POS
Antibody Screen: NEGATIVE

## 2019-08-13 LAB — APTT: aPTT: 30 seconds (ref 24–36)

## 2019-08-13 NOTE — Progress Notes (Addendum)
PCP - Stephani Police, PA-c Cardiologist - denies   PPM/ICD - denies  Chest x-ray - N/A EKG - 02/01/2019 Stress Test - denies ECHO - denies Cardiac Cath - denies  Sleep Study - denies CPAP - N/A  DM: denies  Blood Thinner Instructions: N/A Aspirin Instructions: N/A  ERAS Protcol - Yes PRE-SURGERY Ensure or G2- Ensure given  COVID TEST- Scheduled for 08/19/2019. Patient verbalized understanding of self-quarantine instructions, appointment time and place.  Anesthesia review: YES, elevated ALT  Patient denies shortness of breath, fever, cough and chest pain at PAT appointment  All instructions explained to the patient, with a verbal understanding of the material. Patient agrees to go over the instructions while at home for a better understanding. Patient also instructed to self quarantine after being tested for COVID-19. The opportunity to ask questions was provided.

## 2019-08-19 ENCOUNTER — Other Ambulatory Visit (HOSPITAL_COMMUNITY)
Admission: RE | Admit: 2019-08-19 | Discharge: 2019-08-19 | Disposition: A | Discharge status: Home / Self Care | Payer: Managed Care, Other (non HMO) | Source: Ambulatory Visit | Attending: Orthopedic Surgery | Admitting: Orthopedic Surgery

## 2019-08-19 LAB — SARS CORONAVIRUS 2 (TAT 6-24 HRS): SARS Coronavirus 2: NEGATIVE

## 2019-08-21 ENCOUNTER — Inpatient Hospital Stay (HOSPITAL_COMMUNITY): Payer: Managed Care, Other (non HMO)

## 2019-08-21 ENCOUNTER — Inpatient Hospital Stay (HOSPITAL_COMMUNITY): Payer: Managed Care, Other (non HMO) | Admitting: Physician Assistant

## 2019-08-21 ENCOUNTER — Encounter (HOSPITAL_COMMUNITY): Payer: Self-pay | Admitting: Orthopedic Surgery

## 2019-08-21 ENCOUNTER — Other Ambulatory Visit: Payer: Self-pay

## 2019-08-21 ENCOUNTER — Inpatient Hospital Stay (HOSPITAL_COMMUNITY)
Admission: RE | Admit: 2019-08-21 | Discharge: 2019-08-22 | DRG: 455 | Disposition: A | Discharge status: Home / Self Care | Payer: Managed Care, Other (non HMO) | Attending: Orthopedic Surgery | Admitting: Orthopedic Surgery

## 2019-08-21 ENCOUNTER — Encounter (HOSPITAL_COMMUNITY)
Admission: RE | Disposition: A | Discharge status: Home / Self Care | Payer: Self-pay | Source: Ambulatory Visit | Attending: Orthopedic Surgery

## 2019-08-21 ENCOUNTER — Inpatient Hospital Stay (HOSPITAL_COMMUNITY): Payer: Managed Care, Other (non HMO) | Admitting: Certified Registered"

## 2019-08-21 DIAGNOSIS — M5116 Intervertebral disc disorders with radiculopathy, lumbar region: Secondary | ICD-10-CM | POA: Diagnosis present

## 2019-08-21 DIAGNOSIS — E669 Obesity, unspecified: Secondary | ICD-10-CM | POA: Diagnosis present

## 2019-08-21 DIAGNOSIS — Z888 Allergy status to other drugs, medicaments and biological substances status: Secondary | ICD-10-CM | POA: Diagnosis not present

## 2019-08-21 DIAGNOSIS — Z419 Encounter for procedure for purposes other than remedying health state, unspecified: Secondary | ICD-10-CM

## 2019-08-21 DIAGNOSIS — Z79899 Other long term (current) drug therapy: Secondary | ICD-10-CM

## 2019-08-21 DIAGNOSIS — Z87442 Personal history of urinary calculi: Secondary | ICD-10-CM | POA: Diagnosis not present

## 2019-08-21 DIAGNOSIS — Z823 Family history of stroke: Secondary | ICD-10-CM

## 2019-08-21 DIAGNOSIS — Z6830 Body mass index (BMI) 30.0-30.9, adult: Secondary | ICD-10-CM

## 2019-08-21 DIAGNOSIS — Z8249 Family history of ischemic heart disease and other diseases of the circulatory system: Secondary | ICD-10-CM

## 2019-08-21 DIAGNOSIS — E7800 Pure hypercholesterolemia, unspecified: Secondary | ICD-10-CM | POA: Diagnosis present

## 2019-08-21 DIAGNOSIS — Z8679 Personal history of other diseases of the circulatory system: Secondary | ICD-10-CM

## 2019-08-21 DIAGNOSIS — M5410 Radiculopathy, site unspecified: Secondary | ICD-10-CM | POA: Diagnosis present

## 2019-08-21 DIAGNOSIS — Z885 Allergy status to narcotic agent status: Secondary | ICD-10-CM | POA: Diagnosis not present

## 2019-08-21 DIAGNOSIS — Z833 Family history of diabetes mellitus: Secondary | ICD-10-CM | POA: Diagnosis not present

## 2019-08-21 DIAGNOSIS — Z20822 Contact with and (suspected) exposure to covid-19: Secondary | ICD-10-CM | POA: Diagnosis present

## 2019-08-21 HISTORY — PX: TRANSFORAMINAL LUMBAR INTERBODY FUSION (TLIF) WITH PEDICLE SCREW FIXATION 1 LEVEL: SHX6141

## 2019-08-21 SURGERY — TRANSFORAMINAL LUMBAR INTERBODY FUSION (TLIF) WITH PEDICLE SCREW FIXATION 1 LEVEL
Anesthesia: General | Site: Spine Lumbar | Laterality: Left

## 2019-08-21 MED ORDER — CEFAZOLIN SODIUM-DEXTROSE 2-4 GM/100ML-% IV SOLN
INTRAVENOUS | Status: AC
  Filled 2019-08-21: qty 100

## 2019-08-21 MED ORDER — PROPOFOL 1000 MG/100ML IV EMUL
INTRAVENOUS | Status: AC
  Filled 2019-08-21: qty 100

## 2019-08-21 MED ORDER — ALBUMIN HUMAN 5 % IV SOLN
INTRAVENOUS | Status: DC | PRN
Start: 2019-08-21 — End: 2019-08-21

## 2019-08-21 MED ORDER — EPHEDRINE SULFATE-NACL 50-0.9 MG/10ML-% IV SOSY
PREFILLED_SYRINGE | INTRAVENOUS | Status: DC | PRN
  Administered 2019-08-21: 10 mg via INTRAVENOUS

## 2019-08-21 MED ORDER — DOCUSATE SODIUM 100 MG PO CAPS
100.0000 mg | ORAL_CAPSULE | Freq: Two times a day (BID) | ORAL | Status: DC
  Administered 2019-08-21 – 2019-08-22 (×2): 100 mg via ORAL
  Filled 2019-08-21 (×2): qty 1

## 2019-08-21 MED ORDER — SUGAMMADEX SODIUM 200 MG/2ML IV SOLN
INTRAVENOUS | Status: DC | PRN
  Administered 2019-08-21: 200 mg via INTRAVENOUS

## 2019-08-21 MED ORDER — SODIUM CHLORIDE 0.9 % IV SOLN: 250.0000 mL | INTRAVENOUS | Status: DC

## 2019-08-21 MED ORDER — PROPOFOL 10 MG/ML IV BOLUS
INTRAVENOUS | Status: DC | PRN
  Administered 2019-08-21: 200 mg via INTRAVENOUS

## 2019-08-21 MED ORDER — MORPHINE SULFATE (PF) 2 MG/ML IV SOLN: 1.0000 mg | INTRAVENOUS | Status: DC | PRN

## 2019-08-21 MED ORDER — PHENYLEPHRINE 40 MCG/ML (10ML) SYRINGE FOR IV PUSH (FOR BLOOD PRESSURE SUPPORT)
PREFILLED_SYRINGE | INTRAVENOUS | Status: DC | PRN
  Administered 2019-08-21: 120 ug via INTRAVENOUS
  Administered 2019-08-21: 160 ug via INTRAVENOUS
  Administered 2019-08-21: 120 ug via INTRAVENOUS

## 2019-08-21 MED ORDER — SENNOSIDES-DOCUSATE SODIUM 8.6-50 MG PO TABS: 1.0000 | ORAL_TABLET | Freq: Every evening | ORAL | Status: DC | PRN

## 2019-08-21 MED ORDER — PHENYLEPHRINE HCL (PRESSORS) 10 MG/ML IV SOLN
INTRAVENOUS | Status: AC
  Filled 2019-08-21: qty 1

## 2019-08-21 MED ORDER — ROCURONIUM BROMIDE 10 MG/ML (PF) SYRINGE
PREFILLED_SYRINGE | INTRAVENOUS | Status: DC | PRN
  Administered 2019-08-21: 20 mg via INTRAVENOUS
  Administered 2019-08-21: 60 mg via INTRAVENOUS
  Administered 2019-08-21 (×3): 20 mg via INTRAVENOUS

## 2019-08-21 MED ORDER — CEFAZOLIN SODIUM-DEXTROSE 2-4 GM/100ML-% IV SOLN
2.0000 g | Freq: Three times a day (TID) | INTRAVENOUS | Status: AC
  Administered 2019-08-21 – 2019-08-22 (×2): 2 g via INTRAVENOUS
  Filled 2019-08-21 (×2): qty 100

## 2019-08-21 MED ORDER — ONDANSETRON HCL 4 MG/2ML IJ SOLN: 4.0000 mg | Freq: Four times a day (QID) | INTRAMUSCULAR | Status: DC | PRN

## 2019-08-21 MED ORDER — POVIDONE-IODINE 7.5 % EX SOLN
Freq: Once | CUTANEOUS | Status: DC
  Filled 2019-08-21: qty 118

## 2019-08-21 MED ORDER — OXYCODONE-ACETAMINOPHEN 5-325 MG PO TABS
1.0000 | ORAL_TABLET | ORAL | Status: DC | PRN
  Administered 2019-08-22 (×2): 2 via ORAL
  Administered 2019-08-22: 1 via ORAL
  Filled 2019-08-21: qty 1
  Filled 2019-08-21 (×2): qty 2

## 2019-08-21 MED ORDER — EPINEPHRINE PF 1 MG/ML IJ SOLN
INTRAMUSCULAR | Status: AC
  Filled 2019-08-21: qty 1

## 2019-08-21 MED ORDER — THROMBIN 20000 UNITS EX SOLR
CUTANEOUS | Status: AC
  Filled 2019-08-21: qty 20000

## 2019-08-21 MED ORDER — ROCURONIUM BROMIDE 10 MG/ML (PF) SYRINGE
PREFILLED_SYRINGE | INTRAVENOUS | Status: AC
  Filled 2019-08-21: qty 10

## 2019-08-21 MED ORDER — PROMETHAZINE HCL 25 MG/ML IJ SOLN: 6.2500 mg | INTRAMUSCULAR | Status: DC | PRN

## 2019-08-21 MED ORDER — FENTANYL CITRATE (PF) 250 MCG/5ML IJ SOLN
INTRAMUSCULAR | Status: AC
  Filled 2019-08-21: qty 5

## 2019-08-21 MED ORDER — METHOCARBAMOL 1000 MG/10ML IJ SOLN
500.0000 mg | Freq: Four times a day (QID) | INTRAVENOUS | Status: DC | PRN
  Filled 2019-08-21: qty 5

## 2019-08-21 MED ORDER — CEFAZOLIN SODIUM 1 G IJ SOLR
INTRAMUSCULAR | Status: AC
  Filled 2019-08-21: qty 20

## 2019-08-21 MED ORDER — THROMBIN 20000 UNITS EX KIT
PACK | CUTANEOUS | Status: DC | PRN
  Administered 2019-08-21: 20 mL via TOPICAL

## 2019-08-21 MED ORDER — PROPOFOL 500 MG/50ML IV EMUL
INTRAVENOUS | Status: DC | PRN
Start: 2019-08-21 — End: 2019-08-21
  Administered 2019-08-21: 50 ug/kg/min via INTRAVENOUS

## 2019-08-21 MED ORDER — BUPIVACAINE LIPOSOME 1.3 % IJ SUSP
20.0000 mL | Freq: Once | INTRAMUSCULAR | Status: DC
  Filled 2019-08-21: qty 20

## 2019-08-21 MED ORDER — SUCCINYLCHOLINE CHLORIDE 20 MG/ML IJ SOLN
INTRAMUSCULAR | Status: DC | PRN
  Administered 2019-08-21: 120 mg via INTRAVENOUS

## 2019-08-21 MED ORDER — CEFAZOLIN SODIUM-DEXTROSE 2-4 GM/100ML-% IV SOLN
2.0000 g | INTRAVENOUS | Status: AC
  Administered 2019-08-21 (×2): 2 g via INTRAVENOUS

## 2019-08-21 MED ORDER — LACTATED RINGERS IV SOLN
INTRAVENOUS | Status: DC | PRN
Start: 2019-08-21 — End: 2019-08-21

## 2019-08-21 MED ORDER — BUPIVACAINE-EPINEPHRINE 0.25% -1:200000 IJ SOLN
INTRAMUSCULAR | Status: DC | PRN
  Administered 2019-08-21: 30 mL

## 2019-08-21 MED ORDER — DEXAMETHASONE SODIUM PHOSPHATE 10 MG/ML IJ SOLN
INTRAMUSCULAR | Status: DC | PRN
  Administered 2019-08-21: 10 mg via INTRAVENOUS

## 2019-08-21 MED ORDER — LIDOCAINE 2% (20 MG/ML) 5 ML SYRINGE
INTRAMUSCULAR | Status: DC | PRN
  Administered 2019-08-21: 60 mg via INTRAVENOUS

## 2019-08-21 MED ORDER — PHENOL 1.4 % MT LIQD: 1.0000 | OROMUCOSAL | Status: DC | PRN

## 2019-08-21 MED ORDER — METHYLENE BLUE 0.5 % INJ SOLN
INTRAVENOUS | Status: DC | PRN
  Administered 2019-08-21: .2 mL via INTRADERMAL

## 2019-08-21 MED ORDER — ONDANSETRON HCL 4 MG PO TABS: 4.0000 mg | ORAL_TABLET | Freq: Four times a day (QID) | ORAL | Status: DC | PRN

## 2019-08-21 MED ORDER — FLEET ENEMA 7-19 GM/118ML RE ENEM: 1.0000 | ENEMA | Freq: Once | RECTAL | Status: DC | PRN

## 2019-08-21 MED ORDER — PROPOFOL 10 MG/ML IV BOLUS
INTRAVENOUS | Status: AC
  Filled 2019-08-21: qty 20

## 2019-08-21 MED ORDER — SODIUM CHLORIDE 0.9% FLUSH
3.0000 mL | Freq: Two times a day (BID) | INTRAVENOUS | Status: DC
  Administered 2019-08-21: 3 mL via INTRAVENOUS

## 2019-08-21 MED ORDER — EPHEDRINE 5 MG/ML INJ
INTRAVENOUS | Status: AC
  Filled 2019-08-21: qty 10

## 2019-08-21 MED ORDER — SUCCINYLCHOLINE CHLORIDE 200 MG/10ML IV SOSY
PREFILLED_SYRINGE | INTRAVENOUS | Status: AC
  Filled 2019-08-21: qty 10

## 2019-08-21 MED ORDER — ALUM & MAG HYDROXIDE-SIMETH 200-200-20 MG/5ML PO SUSP: 30.0000 mL | Freq: Four times a day (QID) | ORAL | Status: DC | PRN

## 2019-08-21 MED ORDER — ACETAMINOPHEN 650 MG RE SUPP: 650.0000 mg | RECTAL | Status: DC | PRN

## 2019-08-21 MED ORDER — ACETAMINOPHEN 325 MG PO TABS
650.0000 mg | ORAL_TABLET | ORAL | Status: DC | PRN
  Administered 2019-08-21: 650 mg via ORAL
  Administered 2019-08-22: 325 mg via ORAL
  Filled 2019-08-21: qty 2

## 2019-08-21 MED ORDER — DEXAMETHASONE SODIUM PHOSPHATE 10 MG/ML IJ SOLN
INTRAMUSCULAR | Status: AC
  Filled 2019-08-21: qty 1

## 2019-08-21 MED ORDER — MENTHOL 3 MG MT LOZG: 1.0000 | LOZENGE | OROMUCOSAL | Status: DC | PRN

## 2019-08-21 MED ORDER — SODIUM CHLORIDE 0.9% FLUSH: 3.0000 mL | INTRAVENOUS | Status: DC | PRN

## 2019-08-21 MED ORDER — FENTANYL CITRATE (PF) 100 MCG/2ML IJ SOLN: 25.0000 ug | INTRAMUSCULAR | Status: DC | PRN

## 2019-08-21 MED ORDER — PHENYLEPHRINE HCL-NACL 10-0.9 MG/250ML-% IV SOLN
INTRAVENOUS | Status: DC | PRN
  Administered 2019-08-21: 25 ug/min via INTRAVENOUS

## 2019-08-21 MED ORDER — METHOCARBAMOL 500 MG PO TABS
500.0000 mg | ORAL_TABLET | Freq: Four times a day (QID) | ORAL | Status: DC | PRN
  Administered 2019-08-21: 500 mg via ORAL
  Filled 2019-08-21 (×2): qty 1

## 2019-08-21 MED ORDER — ONDANSETRON HCL 4 MG/2ML IJ SOLN
INTRAMUSCULAR | Status: AC
  Filled 2019-08-21: qty 2

## 2019-08-21 MED ORDER — LIDOCAINE 2% (20 MG/ML) 5 ML SYRINGE
INTRAMUSCULAR | Status: AC
  Filled 2019-08-21: qty 5

## 2019-08-21 MED ORDER — PHENYLEPHRINE 40 MCG/ML (10ML) SYRINGE FOR IV PUSH (FOR BLOOD PRESSURE SUPPORT)
PREFILLED_SYRINGE | INTRAVENOUS | Status: AC
  Filled 2019-08-21: qty 10

## 2019-08-21 MED ORDER — BUPIVACAINE HCL (PF) 0.25 % IJ SOLN
INTRAMUSCULAR | Status: AC
  Filled 2019-08-21: qty 30

## 2019-08-21 MED ORDER — ONDANSETRON HCL 4 MG/2ML IJ SOLN
INTRAMUSCULAR | Status: DC | PRN
  Administered 2019-08-21: 4 mg via INTRAVENOUS

## 2019-08-21 MED ORDER — BUPIVACAINE LIPOSOME 1.3 % IJ SUSP
INTRAMUSCULAR | Status: DC | PRN
  Administered 2019-08-21: 20 mL

## 2019-08-21 MED ORDER — BISACODYL 5 MG PO TBEC: 5.0000 mg | DELAYED_RELEASE_TABLET | Freq: Every day | ORAL | Status: DC | PRN

## 2019-08-21 MED ORDER — LACTATED RINGERS IV SOLN: INTRAVENOUS | Status: DC | PRN

## 2019-08-21 MED ORDER — FENTANYL CITRATE (PF) 100 MCG/2ML IJ SOLN
INTRAMUSCULAR | Status: DC | PRN
  Administered 2019-08-21 (×3): 50 ug via INTRAVENOUS
  Administered 2019-08-21: 100 ug via INTRAVENOUS

## 2019-08-21 MED ORDER — 0.9 % SODIUM CHLORIDE (POUR BTL) OPTIME
TOPICAL | Status: DC | PRN
  Administered 2019-08-21: 3000 mL

## 2019-08-21 MED ORDER — MIDAZOLAM HCL 2 MG/2ML IJ SOLN
INTRAMUSCULAR | Status: AC
  Filled 2019-08-21: qty 2

## 2019-08-21 MED ORDER — MIDAZOLAM HCL 2 MG/2ML IJ SOLN
INTRAMUSCULAR | Status: DC | PRN
  Administered 2019-08-21: 2 mg via INTRAVENOUS

## 2019-08-21 MED ORDER — POTASSIUM CHLORIDE IN NACL 20-0.9 MEQ/L-% IV SOLN: INTRAVENOUS | Status: DC

## 2019-08-21 MED ORDER — ZOLPIDEM TARTRATE 5 MG PO TABS: 5.0000 mg | ORAL_TABLET | Freq: Every evening | ORAL | Status: DC | PRN

## 2019-08-21 MED ORDER — METHYLENE BLUE 0.5 % INJ SOLN
INTRAVENOUS | Status: AC
  Filled 2019-08-21: qty 10

## 2019-08-21 SURGICAL SUPPLY — 102 items
AGENT HMST KT MTR STRL THRMB (HEMOSTASIS)
APL SKNCLS STERI-STRIP NONHPOA (GAUZE/BANDAGES/DRESSINGS) ×1
BENZOIN TINCTURE PRP APPL 2/3 (GAUZE/BANDAGES/DRESSINGS) ×3 IMPLANT
BLADE CLIPPER SURG (BLADE) IMPLANT
BONE VIVIGEN FORMABLE 10CC (Bone Implant) ×3 IMPLANT
BUR PRESCISION 1.7 ELITE (BURR) ×3 IMPLANT
BUR ROUND FLUTED 5 RND (BURR) ×2 IMPLANT
BUR ROUND FLUTED 5MM RND (BURR) ×1
BUR ROUND PRECISION 4.0 (BURR) IMPLANT
BUR ROUND PRECISION 4.0MM (BURR)
BUR SABER RD CUTTING 3.0 (BURR) IMPLANT
BUR SABER RD CUTTING 3.0MM (BURR)
CAGE CONCORDE BULLET 9X11X27 (Cage) ×2 IMPLANT
CAGE CONCORDE BULLET 9X11X27MM (Cage) ×1 IMPLANT
CAGE SPNL 5D BLT NOSE 27X9X11 (Cage) IMPLANT
CARTRIDGE OIL MAESTRO DRILL (MISCELLANEOUS) ×1 IMPLANT
CLOSURE STERI-STRIP 1/2X4 (GAUZE/BANDAGES/DRESSINGS) ×1
CLOSURE WOUND 1/2 X4 (GAUZE/BANDAGES/DRESSINGS) ×2
CLSR STERI-STRIP ANTIMIC 1/2X4 (GAUZE/BANDAGES/DRESSINGS) ×1 IMPLANT
CNTNR URN SCR LID CUP LEK RST (MISCELLANEOUS) ×1 IMPLANT
CONT SPEC 4OZ STRL OR WHT (MISCELLANEOUS) ×3
COVER BACK TABLE 60X90IN (DRAPES) ×3 IMPLANT
COVER MAYO STAND STRL (DRAPES) ×6 IMPLANT
COVER SURGICAL LIGHT HANDLE (MISCELLANEOUS) ×3 IMPLANT
COVER WAND RF STERILE (DRAPES) ×3 IMPLANT
DIFFUSER DRILL AIR PNEUMATIC (MISCELLANEOUS) ×3 IMPLANT
DRAIN CHANNEL 15F RND FF W/TCR (WOUND CARE) IMPLANT
DRAIN CHANNEL 19F RND (DRAIN) ×2 IMPLANT
DRAPE C-ARM 42X72 X-RAY (DRAPES) ×3 IMPLANT
DRAPE C-ARMOR (DRAPES) IMPLANT
DRAPE POUCH INSTRU U-SHP 10X18 (DRAPES) ×3 IMPLANT
DRAPE SURG 17X23 STRL (DRAPES) ×12 IMPLANT
DURAPREP 26ML APPLICATOR (WOUND CARE) ×3 IMPLANT
ELECT BLADE 4.0 EZ CLEAN MEGAD (MISCELLANEOUS) ×3
ELECT CAUTERY BLADE 6.4 (BLADE) ×3 IMPLANT
ELECT REM PT RETURN 9FT ADLT (ELECTROSURGICAL) ×3
ELECTRODE BLDE 4.0 EZ CLN MEGD (MISCELLANEOUS) ×1 IMPLANT
ELECTRODE REM PT RTRN 9FT ADLT (ELECTROSURGICAL) ×1 IMPLANT
EVACUATOR SILICONE 100CC (DRAIN) ×2 IMPLANT
FEE INTRAOP MONITOR IMPULS NCS (MISCELLANEOUS) IMPLANT
FILTER STRAW FLUID ASPIR (MISCELLANEOUS) ×3 IMPLANT
GAUZE 4X4 16PLY RFD (DISPOSABLE) ×3 IMPLANT
GAUZE SPONGE 4X4 12PLY STRL (GAUZE/BANDAGES/DRESSINGS) ×3 IMPLANT
GLOVE BIO SURGEON STRL SZ7 (GLOVE) ×3 IMPLANT
GLOVE BIO SURGEON STRL SZ8 (GLOVE) ×3 IMPLANT
GLOVE BIOGEL PI IND STRL 7.0 (GLOVE) ×1 IMPLANT
GLOVE BIOGEL PI IND STRL 8 (GLOVE) ×1 IMPLANT
GLOVE BIOGEL PI INDICATOR 7.0 (GLOVE) ×2
GLOVE BIOGEL PI INDICATOR 8 (GLOVE) ×2
GOWN STRL REUS W/ TWL LRG LVL3 (GOWN DISPOSABLE) ×2 IMPLANT
GOWN STRL REUS W/ TWL XL LVL3 (GOWN DISPOSABLE) ×1 IMPLANT
GOWN STRL REUS W/TWL LRG LVL3 (GOWN DISPOSABLE) ×6
GOWN STRL REUS W/TWL XL LVL3 (GOWN DISPOSABLE) ×3
GRAFT BNE MATRIX VG FRMBL L 10 (Bone Implant) IMPLANT
INTRAOP MONITOR FEE IMPULS NCS (MISCELLANEOUS) ×1
INTRAOP MONITOR FEE IMPULSE (MISCELLANEOUS) ×3
IV CATH 14GX2 1/4 (CATHETERS) ×3 IMPLANT
KIT BASIN OR (CUSTOM PROCEDURE TRAY) ×3 IMPLANT
KIT POSITION SURG JACKSON T1 (MISCELLANEOUS) ×3 IMPLANT
KIT TURNOVER KIT B (KITS) ×3 IMPLANT
MARKER SKIN DUAL TIP RULER LAB (MISCELLANEOUS) ×6 IMPLANT
NDL 18GX1X1/2 (RX/OR ONLY) (NEEDLE) ×1 IMPLANT
NDL HYPO 25GX1X1/2 BEV (NEEDLE) ×1 IMPLANT
NDL SPNL 18GX3.5 QUINCKE PK (NEEDLE) ×2 IMPLANT
NEEDLE 18GX1X1/2 (RX/OR ONLY) (NEEDLE) ×3 IMPLANT
NEEDLE 22X1 1/2 (OR ONLY) (NEEDLE) ×6 IMPLANT
NEEDLE HYPO 25GX1X1/2 BEV (NEEDLE) ×3 IMPLANT
NEEDLE SPNL 18GX3.5 QUINCKE PK (NEEDLE) ×6 IMPLANT
NS IRRIG 1000ML POUR BTL (IV SOLUTION) ×3 IMPLANT
OIL CARTRIDGE MAESTRO DRILL (MISCELLANEOUS) ×3
PACK LAMINECTOMY ORTHO (CUSTOM PROCEDURE TRAY) ×3 IMPLANT
PACK UNIVERSAL I (CUSTOM PROCEDURE TRAY) ×3 IMPLANT
PAD ARMBOARD 7.5X6 YLW CONV (MISCELLANEOUS) ×6 IMPLANT
PATTIES SURGICAL .5 X1 (DISPOSABLE) ×3 IMPLANT
PATTIES SURGICAL .5X1.5 (GAUZE/BANDAGES/DRESSINGS) ×3 IMPLANT
PROBE PED SCREW MONOP 3 BT NCS (MISCELLANEOUS) IMPLANT
ROD PRE BENT EXP 40MM (Rod) ×4 IMPLANT
SCREW PROBE PEDICLE MONOPOLAR (MISCELLANEOUS) ×3
SCREW SET SINGLE INNER (Screw) ×8 IMPLANT
SCREW VIPER CORT FIX 6.00X30 (Screw) ×4 IMPLANT
SCREW VIPER CORTICAL FIX 6X40 (Screw) ×4 IMPLANT
SPONGE INTESTINAL PEANUT (DISPOSABLE) ×3 IMPLANT
SPONGE SURGIFOAM ABS GEL 100 (HEMOSTASIS) ×3 IMPLANT
STRIP CLOSURE SKIN 1/2X4 (GAUZE/BANDAGES/DRESSINGS) ×4 IMPLANT
SURGIFLO W/THROMBIN 8M KIT (HEMOSTASIS) IMPLANT
SUT MNCRL AB 4-0 PS2 18 (SUTURE) ×3 IMPLANT
SUT VIC AB 0 CT1 18XCR BRD 8 (SUTURE) ×1 IMPLANT
SUT VIC AB 0 CT1 8-18 (SUTURE) ×3
SUT VIC AB 1 CT1 18XCR BRD 8 (SUTURE) ×1 IMPLANT
SUT VIC AB 1 CT1 8-18 (SUTURE) ×3
SUT VIC AB 2-0 CT2 18 VCP726D (SUTURE) ×3 IMPLANT
SYR 20ML LL LF (SYRINGE) ×6 IMPLANT
SYR BULB IRRIG 60ML STRL (SYRINGE) ×3 IMPLANT
SYR CONTROL 10ML LL (SYRINGE) ×6 IMPLANT
SYR TB 1ML LUER SLIP (SYRINGE) ×3 IMPLANT
TAP EXPEDIUM DL 4.35 (INSTRUMENTS) ×2 IMPLANT
TAP EXPEDIUM DL 5.0 (INSTRUMENTS) ×2 IMPLANT
TAP EXPEDIUM DL 6.0 (INSTRUMENTS) ×2 IMPLANT
TAPE CLOTH SURG 6X10 WHT LF (GAUZE/BANDAGES/DRESSINGS) ×2 IMPLANT
TRAY FOLEY MTR SLVR 16FR STAT (SET/KITS/TRAYS/PACK) ×3 IMPLANT
WATER STERILE IRR 1000ML POUR (IV SOLUTION) ×3 IMPLANT
YANKAUER SUCT BULB TIP NO VENT (SUCTIONS) ×3 IMPLANT

## 2019-08-21 NOTE — Anesthesia Preprocedure Evaluation (Addendum)
Anesthesia Evaluation  Patient identified by MRN, date of birth, ID band Patient awake    Reviewed: Allergy & Precautions, NPO status , Patient's Chart, lab work & pertinent test results  History of Anesthesia Complications Negative for: history of anesthetic complications  Airway Mallampati: II  TM Distance: >3 FB Neck ROM: Full    Dental  (+) Dental Advisory Given, Teeth Intact   Pulmonary neg pulmonary ROS,    Pulmonary exam normal        Cardiovascular hypertension (no meds), Normal cardiovascular exam     Neuro/Psych negative neurological ROS  negative psych ROS   GI/Hepatic Neg liver ROS, GERD  Controlled,  Endo/Other   Obesity Pre-DM   Renal/GU negative Renal ROS     Musculoskeletal  (+) Arthritis ,   Abdominal (+) + obese,   Peds  Hematology negative hematology ROS (+)   Anesthesia Other Findings Covid neg 5/17  Reproductive/Obstetrics                            Anesthesia Physical Anesthesia Plan  ASA: II  Anesthesia Plan: General   Post-op Pain Management:    Induction: Intravenous  PONV Risk Score and Plan: 3 and Treatment may vary due to age or medical condition, Ondansetron, Midazolam and Dexamethasone  Airway Management Planned: Oral ETT  Additional Equipment: None  Intra-op Plan:   Post-operative Plan: Extubation in OR  Informed Consent: I have reviewed the patients History and Physical, chart, labs and discussed the procedure including the risks, benefits and alternatives for the proposed anesthesia with the patient or authorized representative who has indicated his/her understanding and acceptance.     Dental advisory given  Plan Discussed with: CRNA and Anesthesiologist  Anesthesia Plan Comments:        Anesthesia Quick Evaluation

## 2019-08-21 NOTE — Anesthesia Procedure Notes (Signed)
Procedure Name: Intubation Date/Time: 08/21/2019 8:49 AM Performed by: Barrington Ellison, CRNA Pre-anesthesia Checklist: Patient identified, Emergency Drugs available, Suction available and Patient being monitored Patient Re-evaluated:Patient Re-evaluated prior to induction Oxygen Delivery Method: Circle System Utilized Preoxygenation: Pre-oxygenation with 100% oxygen Induction Type: IV induction and Rapid sequence Ventilation: Mask ventilation without difficulty Laryngoscope Size: Mac and 4 Grade View: Grade I Tube type: Oral Tube size: 7.5 mm Number of attempts: 1 Airway Equipment and Method: Stylet and Oral airway Placement Confirmation: ETT inserted through vocal cords under direct vision,  positive ETCO2 and breath sounds checked- equal and bilateral Secured at: 22 cm Tube secured with: Tape Dental Injury: Teeth and Oropharynx as per pre-operative assessment

## 2019-08-21 NOTE — Anesthesia Postprocedure Evaluation (Signed)
Anesthesia Post Note  Patient: Danny Ayers  Procedure(s) Performed: LEFT-SIDED LUMBAR 4- LUMBAR 5 TRANSFORAMINAL LUMBAR INTERBODY FUSION WITH INSTRUMENTATION AND ALLOGRAFT (Left Spine Lumbar)     Patient location during evaluation: PACU Anesthesia Type: General Level of consciousness: awake and alert Pain management: pain level controlled Vital Signs Assessment: post-procedure vital signs reviewed and stable Respiratory status: spontaneous breathing, nonlabored ventilation and respiratory function stable Cardiovascular status: blood pressure returned to baseline and stable Postop Assessment: no apparent nausea or vomiting Anesthetic complications: no    Last Vitals:  Vitals:   08/21/19 1430 08/21/19 1445  BP: (!) 102/47 (!) 108/52  Pulse: 91 87  Resp: 17 18  Temp:    SpO2: 92% 95%    Last Pain:  Vitals:   08/21/19 1430  TempSrc:   PainSc: 0-No pain                 Beryle Lathe

## 2019-08-21 NOTE — H&P (Signed)
PREOPERATIVE H&P  Chief Complaint: Left leg pain  HPI: Danny Ayers is a 59 y.o. male who presents with ongoing pain in the left leg  MRI reveals a recurrent left L4/5 HNP  Patient has failed multiple forms of conservative care and continues to have pain (see office notes for additional details regarding the patient's full course of treatment)  Past Medical History:  Diagnosis Date  . Arthritis of low back   . GERD (gastroesophageal reflux disease)   . High cholesterol   . History of kidney stones   . Hypertension    diagnosed years ago, lost weight, no longer on medications   Past Surgical History:  Procedure Laterality Date  . BICEPS TENDON REPAIR Bilateral   . CYSTOSCOPY/URETEROSCOPY/HOLMIUM LASER/STENT PLACEMENT Right 02/06/2019   Procedure: CYSTOSCOPY; RIGHT URETEROSCOPY; RIGHT URETERAL STENT PLACEMENT; BALLOON DILATION OF URETERAL STRICTURE; BASKET STONE EXTRACTION;  Surgeon: Bjorn Pippin, MD;  Location: [facility];  Service: Urology;  Laterality: Right;  . LUMBAR LAMINECTOMY/DECOMPRESSION MICRODISCECTOMY Left 04/25/2019   Procedure: LEFT SIDED LUMBAR 4-5 MICRODISECTOMY;  Surgeon: Estill Bamberg, MD;  Location: [facility] OR;  Service: Orthopedics;  Laterality: Left;  . NECK SURGERY    . UPPER GASTROINTESTINAL ENDOSCOPY     Social History   Socioeconomic History  . Marital status: Married    Spouse name: Not on file  . Number of children: Not on file  . Years of education: Not on file  . Highest education level: Not on file  Occupational History  . Not on file  Tobacco Use  . Smoking status: Never Smoker  . Smokeless tobacco: Never Used  Substance and Sexual Activity  . Alcohol use: Not Currently  . Drug use: Never  . Sexual activity: Yes    Partners: Female    Birth control/protection: None  Other Topics Concern  . Not on file  Social History Narrative  . Not on file   Social Determinants of Health   Financial Resource Strain:   . Difficulty of Paying Living  Expenses:   Food Insecurity:   . Worried About Programme researcher, broadcasting/film/video in the Last Year:   . Barista in the Last Year:   Transportation Needs:   . Freight forwarder (Medical):   Marland Kitchen Lack of Transportation (Non-Medical):   Physical Activity:   . Days of Exercise per Week:   . Minutes of Exercise per Session:   Stress:   . Feeling of Stress :   Social Connections:   . Frequency of Communication with Friends and Family:   . Frequency of Social Gatherings with Friends and Family:   . Attends Religious Services:   . Active Member of Clubs or Organizations:   . Attends Banker Meetings:   Marland Kitchen Marital Status:    Family History  Problem Relation Age of Onset  . Prostate cancer Father   . Coronary artery disease Father   . Diabetes Brother   . Prostate cancer Paternal Uncle   . Diabetes Paternal Grandmother   . Stroke Paternal Aunt    Allergies  Allergen Reactions  . Niacin And Related Other (See Comments)    Intense heat   . Statins Other (See Comments)    Extreme headaches  . Tramadol Anxiety    Felt as if he couldn't breathe/panic    . Vicodin [Hydrocodone-Acetaminophen] Anxiety    Felt as if he couldn't breathe/panic    Prior to Admission medications   Medication Sig Start Date End  Date Taking? Authorizing Provider  acetaminophen (TYLENOL) 500 MG tablet Take 500 mg by mouth every 6 (six) hours as needed (for pain.).     [provider]  methocarbamol (ROBAXIN) 500 MG tablet Take 1 tablet (500 mg total) by mouth every 6 (six) hours as needed for muscle spasms. Patient not taking: Reported on 07/30/2019 04/25/19   Justice Britain, PA-C  naproxen sodium (ALEVE) 220 MG tablet Take 220-440 mg by mouth daily as needed (pain).    [provider]  ondansetron (ZOFRAN ODT) 4 MG disintegrating tablet Take 1 tablet (4 mg total) by mouth every 8 (eight) hours as needed. Patient not taking: Reported on 04/16/2019 02/01/19   Rudene Re, MD    oxyCODONE-acetaminophen (PERCOCET) 5-325 MG tablet Take 1 tablet by mouth every 4 (four) hours as needed. Patient not taking: Reported on 04/16/2019 02/06/19   Irine Seal, MD  phenazopyridine (PYRIDIUM) 200 MG tablet Take 1 tablet (200 mg total) by mouth 3 (three) times daily as needed for pain. Patient not taking: Reported on 04/16/2019 02/06/19   Irine Seal, MD  rosuvastatin (CRESTOR) 5 MG tablet TAKE 1/2 TABLET BY MOUTH AT BEDTIME EVERY OTHER DAY**PATIENT NEEDS APT FOR FURTHER REFILLS** Patient not taking: Reported on 07/30/2019 05/27/19   Mellody Dance, DO  Vitamin D, Ergocalciferol, (DRISDOL) 1.25 MG (50000 UT) CAPS capsule Take one tablet wkly Patient not taking: Reported on 04/16/2019 11/27/18   Mellody Dance, DO     All other systems have been reviewed and were otherwise negative with the exception of those mentioned in the HPI and as above.  Physical Exam: Vitals:   08/21/19 0637  BP: 136/85  Pulse: 81  Resp: 18  Temp: 97.7 F (36.5 C)  SpO2: 96%    Body mass index is 30.68 kg/m.  General: Alert, no acute distress Cardiovascular: No pedal edema Respiratory: No cyanosis, no use of accessory musculature Skin: No lesions in the area of chief complaint Neurologic: Sensation intact distally Psychiatric: Patient is competent for consent with normal mood and affect Lymphatic: No axillary or cervical lymphadenopathy   Assessment/Plan: LUMBAR 5 RADICULOPATHY Plan for Procedure(s): LEFT-SIDED LUMBAR 4- LUMBAR 5 TRANSFORAMINAL LUMBAR INTERBODY FUSION WITH INSTRUMENTATION AND ALLOGRAFT   Norva Karvonen, MD 08/21/2019 8:27 AM

## 2019-08-21 NOTE — Op Note (Signed)
PATIENT NAME: Danny Ayers   MEDICAL RECORD NO.:   [id number]   DATE OF BIRTH: 1960-07-21   DATE OF PROCEDURE: 08/21/2019                               OPERATIVE REPORT     PREOPERATIVE DIAGNOSES: 1. Left L5 radiculopathy. 2. 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 Left L5 radiculopathy. 2. 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  Revision L4/5 decompression 2. Left-sided L4-5 transforaminal lumbar interbody fusion. 3. Right-sided L4-5 posterolateral fusion. 4. Insertion of interbody device x1 (2mm Concorde intervertebral spacer). 5. Placement of segmental posterior instrumentation L4, L5 bilaterally  6. Use of local autograft. 7. Use of morselized allograft - Vivigen 8. Intraoperative use of fluoroscopy.   SURGEON:  Estill Bamberg, MD.   ASSISTANTJason Coop, PA-C.   ANESTHESIA:  General endotracheal anesthesia.   COMPLICATIONS:  None.   DISPOSITION:  Stable.   ESTIMATED BLOOD LOSS:  100cc   INDICATIONS FOR SURGERY:  Briefly, Mr. Hocker is a pleasant 59 year old male who did present to me with severe and ongoing pain in the left leg.   Of note, he is status post a previous left L4-5 microdiscectomy procedure.  He did very well from surgery, but did have recurrence of pain.  A repeat MRI did reveal a recurrence moderate to large left L4-5 disc herniation, compressing the left L5 nerve. I did feel that his symptoms were clearly secondary to the findings noted above.  The patient did wish to proceed with the procedure  noted above.   OPERATIVE DETAILS:  On 08/21/2019, the patient was brought to surgery and general endotracheal anesthesia was administered.  The patient was placed prone on a well-padded flat Jackson bed with a spinal frame.  Antibiotics were given and a time-out procedure was performed. The back was prepped and draped in the usual  fashion.  A midline incision was made overlying the L4-5 intervertebral spaces.  The fascia was incised at the midline.  The paraspinal musculature was bluntly swept laterally.  Anatomic landmarks for the pedicles were exposed. Using fluoroscopy, I did cannulate the L5 pedicles bilaterally, using a medial to lateral cortical trajectory technique.  At this point, 6 mm screws were placed into the right pedicles, and a 40 mm rod was placed into the tulip heads of the screw, and caps were also placed.  Distraction was then applied across the L4-5 intervertebral space, and the caps were then provisionally tightened.  On the left side, bone wax was placed into the cannulated pedicle holes.  I then proceeded with the decompressive aspect of the procedure at the L4-5 level.   This was a very meticulous portion of the procedure.  On the left side, there was noted to be abundant epidural scar formation in the region of the dura and traversing left L5 nerve.  I did meticulously perform a full facetectomy on the left side.  Developing a plane between the traversing left L5 nerve and the herniated disc fragment ventral to it was very time-consuming and meticulous, given the abundant scar tissue noted.  The nerve was very much adherent to the herniated disc fragment.  This portion of the procedure did take approximately 45 minutes, whereas normally, this portion of the procedure would take 5 to 10 minutes.  I was however able to develop a  plane between the traversing left L5 nerve and the herniated disc.  The nerve was retracted medially and the herniated fragment was identified and removed.  At this point, with an assistant holding medial retraction of the traversing left L5 nerve, I did perform an annulotomy at the posterolateral aspect of the L4-5 intervertebral space.  I then used a series of curettes and pituitary rongeurs to perform a thorough and complete intervertebral diskectomy.  The intervertebral space was  then liberally packed with autograft as well as allograft in the form of Vivigen, as was the appropriate-sized intervertebral spacer (11 mm, lordotic).  The spacer was then tamped into position in the usual fashion.  I was very pleased with the press-fit of the spacer.  I then placed 6 mm screws on the left at L4 and L5. A 40-mm rod was then placed and caps were placed. The distraction was then released on the contralateral side.  All caps were then locked.  The wound was copiously irrigated with a total of approximately 3 L prior to placing the bone graft.  Additional autograft and allograft was then packed into the posterolateral gutter on the right side to help aid in the success of the fusion.  The wound was  explored for any undue bleeding and there was no substantial bleeding encountered.  Gel-Foam was placed over the laminectomy site.  The wound was then closed in layers using #1 Vicryl followed by 2-0 Vicryl, followed by 4-0 Monocryl.  Benzoin and Steri-Strips were applied followed by sterile dressing.  Of note, a #15 Blake drain was placed deep to the fascia prior to closure.   Of note, did use triggered EMG to test the screws on the left, and there was no screw the tested below 15 mA. There was no sustained abnormal EMG activity noted throughout the surgery.   Of note, Pricilla Holm was my assistant throughout surgery, and did aid in retraction, placement of the hardware, suctioning, and closure.     Phylliss Bob, MD

## 2019-08-21 NOTE — Transfer of Care (Signed)
Immediate Anesthesia Transfer of Care Note  Patient: Danny Ayers  Procedure(s) Performed: LEFT-SIDED LUMBAR 4- LUMBAR 5 TRANSFORAMINAL LUMBAR INTERBODY FUSION WITH INSTRUMENTATION AND ALLOGRAFT (Left Spine Lumbar)  Patient Location: PACU  Anesthesia Type:General  Level of Consciousness: lethargic and responds to stimulation  Airway & Oxygen Therapy: Patient Spontanous Breathing and Patient connected to face mask oxygen  Post-op Assessment: Report given to RN  Post vital signs: Reviewed and stable  Last Vitals:  Vitals Value Taken Time  BP    Temp 36.8 C 08/21/19 1412  Pulse 91 08/21/19 1412  Resp 23 08/21/19 1412  SpO2 94 % 08/21/19 1412  Vitals shown include unvalidated device data.  Last Pain:  Vitals:   08/21/19 0703  TempSrc:   PainSc: 0-No pain      Patients Stated Pain Goal: 3 (08/21/19 0703)  Complications: No apparent anesthesia complications

## 2019-08-22 ENCOUNTER — Encounter: Payer: Self-pay | Admitting: *Deleted

## 2019-08-22 MED ORDER — METHOCARBAMOL 500 MG PO TABS: 500.0000 mg | ORAL_TABLET | Freq: Four times a day (QID) | ORAL | 2 refills | Status: DC | PRN

## 2019-08-22 MED ORDER — OXYCODONE-ACETAMINOPHEN 5-325 MG PO TABS: 1.0000 | ORAL_TABLET | ORAL | 0 refills | Status: DC | PRN

## 2019-08-22 NOTE — Evaluation (Signed)
Physical Therapy Evaluation Patient Details Name: Danny Ayers MRN: [id number] DOB: [birth year] Today's Date: 08/22/2019   History of Present Illness  Pt is a 59 y.o male s/p left TLIF L4-5. PMH includes HTN, GERD, biceps tendon repair, lumabr sx earlier this year, neck surgery    Clinical Impression  Pt presented supine in bed with HOB elevated, awake and willing to participate in therapy session. Prior to admission, pt reported that he was independent with all functional mobility and ADLs. Pt lives with his wife in a single level home with a couple of steps to enter. At the time of evaluation, pt overall moving well with use of RW for hallway ambulation. Pt also participated in stair training without difficulties. PT provided pt education re: back precautions, car transfers and a generalized walking program for pt to initiate upon d/c home. No further acute PT needs identified at this time. PT signing off.     Follow Up Recommendations No PT follow up    Equipment Recommendations  Rolling walker with 5" wheels    Recommendations for Other Services       Precautions / Restrictions Precautions Precautions: Fall;Back Precaution Booklet Issued: Yes (comment) Precaution Comments: reviewed 3/3 back precautions with pt throughout Required Braces or Orthoses: Spinal Brace Spinal Brace: Thoracolumbosacral orthotic Restrictions Weight Bearing Restrictions: No      Mobility  Bed Mobility Overal bed mobility: Needs Assistance Bed Mobility: Sidelying to Sit   Sidelying to sit: Supervision     Sit to sidelying: Supervision General bed mobility comments: cues for technique  Transfers Overall transfer level: Needs assistance Equipment used: Rolling walker (2 wheeled) Transfers: Sit to/from Stand Sit to Stand: Supervision            Ambulation/Gait Ambulation/Gait assistance: Min Gaffer (Feet): 500 Feet Assistive device: Rolling walker (2 wheeled) Gait  Pattern/deviations: Step-through pattern;Decreased stride length Gait velocity: decreased   General Gait Details: pt demonstrating safety with use of RW, no instability or LOB, no difficulties  Stairs Stairs: Yes Stairs assistance: Supervision Stair Management: One rail Right;Alternating pattern;Forwards Number of Stairs: 3 General stair comments: pt performed without difficulties  Wheelchair Mobility    Modified Rankin (Stroke Patients Only)       Balance Overall balance assessment: Mild deficits observed, not formally tested                                           Pertinent Vitals/Pain Pain Assessment: Faces Faces Pain Scale: Hurts little more Pain Location: incision site Pain Descriptors / Indicators: Aching;Sore Pain Intervention(s): Monitored during session;Repositioned    Home Living Family/patient expects to be discharged to:: Private residence Living Arrangements: Spouse/significant other Available Help at Discharge: Family;Available PRN/intermittently Type of Home: House Home Access: Stairs to enter   [employer] of Steps: 2 Home Layout: One level Home Equipment: None      Prior Function Level of Independence: Independent         Comments: works as Administrator, independent lifestyle     Journalist, newspaper        Extremity/Trunk Assessment   Upper Extremity Assessment Upper Extremity Assessment: Defer to OT evaluation;Overall WFL for tasks assessed    Lower Extremity Assessment Lower Extremity Assessment: Overall WFL for tasks assessed    Cervical / Trunk Assessment Cervical / Trunk Assessment: Other exceptions Cervical / Trunk Exceptions: s/p lumbar sx  Communication  Communication: No difficulties  Cognition Arousal/Alertness: Awake/alert Behavior During Therapy: WFL for tasks assessed/performed Overall Cognitive Status: Within Functional Limits for tasks assessed                                         General Comments      Exercises     Assessment/Plan    PT Assessment Patent does not need any further PT services  PT Problem List         PT Treatment Interventions      PT Goals (Current goals can be found in the Care Plan section)  Acute Rehab PT Goals Patient Stated Goal: return to work PT Goal Formulation: All assessment and education complete, DC therapy    Frequency     Barriers to discharge        Co-evaluation               AM-PAC PT "6 Clicks" Mobility  Outcome Measure Help needed turning from your back to your side while in a flat bed without using bedrails?: None Help needed moving from lying on your back to sitting on the side of a flat bed without using bedrails?: None Help needed moving to and from a bed to a chair (including a wheelchair)?: None Help needed standing up from a chair using your arms (e.g., wheelchair or bedside chair)?: None Help needed to walk in hospital room?: None Help needed climbing 3-5 steps with a railing? : None 6 Click Score: 24    End of Session Equipment Utilized During Treatment: Back brace Activity Tolerance: Patient tolerated treatment well Patient left: in bed;with call bell/phone within reach;Other (comment)(seated EOB with OT present) Nurse Communication: Mobility status PT Visit Diagnosis: Other abnormalities of gait and mobility (R26.89)    Time: 8768-1157 PT Time Calculation (min) (ACUTE ONLY): 27 min   Charges:   PT Evaluation $PT Eval Moderate Complexity: 1 Mod PT Treatments $Gait Training: 8-22 mins        Arletta Bale, DPT  Acute Rehabilitation Services Pager [phone] Office [phone]    Alessandra Bevels Alexiah Koroma 08/22/2019, 11:35 AM

## 2019-08-22 NOTE — Evaluation (Signed)
Occupational Therapy Evaluation Patient Details Name: Danny Ayers MRN: [id number] DOB: October 04, 1960 Today's Date: 08/22/2019    History of Present Illness 58 y.o male s/p left TLIF L4-5. PMH includes HTN, GERD, biceps tendon repair, lumabr sx earlier this year, neck surgery   Clinical Impression   PTA pt living with spouse, independent for BADL/IADL and working manual job. At time of eval, pt completing bed mobility and sit <> stands at supervision level of assist. Reviewed safe dressing strategies, pt able to complete figure 4 method. Back handout provided and reviewed adls in detail. Pt educated on: clothing between brace, never sleep in brace, set an alarm at night for medication, avoid sitting for long periods of time, correct bed positioning for sleeping, correct sequence for bed mobility, avoiding lifting more than 5 pounds and never wash directly over incision. All education is complete and patient indicates understanding. No further OT needs identified, OT will sign off. Thank you for this consult.    Follow Up Recommendations  No OT follow up    Equipment Recommendations  None recommended by OT    Recommendations for Other Services       Precautions / Restrictions Precautions Precautions: Fall;Back Precaution Booklet Issued: Yes (comment) Precaution Comments: reviewedin context of BADL Required Braces or Orthoses: Spinal Brace Spinal Brace: Thoracolumbosacral orthotic Restrictions Weight Bearing Restrictions: No      Mobility Bed Mobility Overal bed mobility: Needs Assistance Bed Mobility: Sit to Sidelying         Sit to sidelying: Supervision General bed mobility comments: cues for technique  Transfers Overall transfer level: Needs assistance   Transfers: Sit to/from Stand Sit to Stand: Supervision              Balance Overall balance assessment: Mild deficits observed, not formally tested                                         ADL  either performed or assessed with clinical judgement   ADL                                         General ADL Comments: Pt demonstrated ability to complete BADL at mod I level. Reviewed dressing, bathing, toileting, and some IADL routine with safe back precautions. Pt demonstrated figure four method, toilet transfer, and functional mobility without external assist. Pt is more steady with RW for longer distances     Vision Patient Visual Report: No change from baseline       Perception     Praxis      Pertinent Vitals/Pain Pain Assessment: Faces Faces Pain Scale: Hurts little more Pain Location: incision site Pain Descriptors / Indicators: Aching;Sore Pain Intervention(s): Limited activity within patient's tolerance;Monitored during session;Repositioned     Hand Dominance     Extremity/Trunk Assessment Upper Extremity Assessment Upper Extremity Assessment: Overall WFL for tasks assessed   Lower Extremity Assessment Lower Extremity Assessment: Defer to PT evaluation       Communication Communication Communication: No difficulties   Cognition Arousal/Alertness: Awake/alert Behavior During Therapy: WFL for tasks assessed/performed Overall Cognitive Status: Within Functional Limits for tasks assessed  General Comments       Exercises     Shoulder Instructions      Home Living Family/patient expects to be discharged to:: Private residence Living Arrangements: Spouse/significant other Available Help at Discharge: Family;Available PRN/intermittently Type of Home: House Home Access: Stairs to enter [employer] of Steps: 2   Home Layout: One level     Bathroom Shower/Tub: Chief Strategy Officer: Handicapped height     Home Equipment: None          Prior Functioning/Environment Level of Independence: Independent        Comments: works as Naval architect, independent  lifestyle        OT Problem List: Decreased knowledge of use of DME or AE;Decreased knowledge of precautions;Pain      OT Treatment/Interventions:      OT Goals(Current goals can be found in the care plan section) Acute Rehab OT Goals Patient Stated Goal: return to work OT Goal Formulation: All assessment and education complete, DC therapy  OT Frequency:     Barriers to D/C:            Co-evaluation              AM-PAC OT "6 Clicks" Daily Activity     Outcome Measure Help from another person eating meals?: None Help from another person taking care of personal grooming?: None Help from another person toileting, which includes using toliet, bedpan, or urinal?: None Help from another person bathing (including washing, rinsing, drying)?: None Help from another person to put on and taking off regular upper body clothing?: None Help from another person to put on and taking off regular lower body clothing?: None 6 Click Score: 24   End of Session Equipment Utilized During Treatment: Gait belt;Rolling walker;Back brace Nurse Communication: Mobility status  Activity Tolerance: Patient tolerated treatment well Patient left: in bed;with call bell/phone within reach  OT Visit Diagnosis: Unsteadiness on feet (R26.81);Other abnormalities of gait and mobility (R26.89);Pain Pain - part of body: (back)                Time: 0093-8182 OT Time Calculation (min): 15 min Charges:  OT General Charges $OT Visit: 1 Visit OT Evaluation $OT Eval Low Complexity: 1 Low  Dalphine Handing, MSOT, OTR/L Acute Rehabilitation Services [facility] Office Number: [phone] Pager: [phone]  Dalphine Handing 08/22/2019, 10:20 AM

## 2019-08-22 NOTE — Progress Notes (Signed)
    Patient doing well Denies leg pain + left thigh numbness   Physical Exam: Vitals:   08/21/19 2338 08/22/19 0424  BP: 113/61 112/69  Pulse: 94 78  Resp: 20 20  Temp: 98.8 F (37.1 C) 98.6 F (37 C)  SpO2: 94% 96%   Patient appears comfortable Dressing in place NVI  Drain output: 20cc / 5 hours  POD #1 s/p revision lumbar decompression and fusion  - up with PT/OT, encourage ambulation - Percocet for pain, Robaxin for muscle spasms - likely d/c home today with f/u in 2 weeks

## 2019-08-22 NOTE — Plan of Care (Signed)
Pt doing well. Pt given D/C instructions with verbal understanding. Rx's were sent to the pharmacy by MD. Pt's incision is clean and dry with no sign of infection. Pt's IV and JP drain were removed prior to D/C. Pt received RW from [employer] for home use prior to D/C. Pt D/C'd home via wheelchair per MD order. Pt is stable @ D/C and has no other needs at this time. Rema Fendt, RN

## 2019-08-28 NOTE — Discharge Summary (Signed)
Patient ID: Danny Ayers MRN: [id number] DOB/AGE: 01-02-1961 59 y.o.  Admit date: 08/21/2019 Discharge date: 08/22/19  Admission Diagnoses:  Active Problems:   Radiculopathy   Discharge Diagnoses:  Same  Past Medical History:  Diagnosis Date  . Arthritis of low back   . GERD (gastroesophageal reflux disease)   . High cholesterol   . History of kidney stones   . Hypertension    diagnosed years ago, lost weight, no longer on medications    Surgeries: Procedure(s): LEFT-SIDED LUMBAR 4- LUMBAR 5 TRANSFORAMINAL LUMBAR INTERBODY FUSION WITH INSTRUMENTATION AND ALLOGRAFT on 08/21/2019   Consultants: None  Discharged Condition: Improved  Hospital Course: Danny Ayers is an 59 y.o. male who was admitted 08/21/2019 for operative treatment of radiculopathy. Patient has severe unremitting pain that affects sleep, daily activities, and work/hobbies. After pre-op clearance the patient was taken to the operating room on 08/21/2019 and underwent  Procedure(s): LEFT-SIDED LUMBAR 4- LUMBAR 5 TRANSFORAMINAL LUMBAR INTERBODY FUSION WITH INSTRUMENTATION AND ALLOGRAFT.    Patient was given perioperative antibiotics:  Anti-infectives (From admission, onward)   Start     Dose/Rate Route Frequency Ordered Stop   08/21/19 2100  ceFAZolin (ANCEF) IVPB 2g/100 mL premix     2 g 200 mL/hr over 30 Minutes Intravenous Every 8 hours 08/21/19 1605 08/22/19 0417   08/21/19 0700  ceFAZolin (ANCEF) IVPB 2g/100 mL premix     2 g 200 mL/hr over 30 Minutes Intravenous On call to O.R. 08/21/19 4431 08/21/19 1252       Patient was given sequential compression devices, early ambulation to prevent DVT.  Patient benefited maximally from hospital stay and there were no complications.    Recent vital signs: BP 100/68 (BP Location: Right Arm)   Pulse 89   Temp 98.7 F (37.1 C) (Oral)   Resp 17   Ht 5\' 11"  (1.803 m)   Wt 99.8 kg   SpO2 93%   BMI 30.68 kg/m    Discharge Medications:   Allergies as of  08/22/2019      Reactions   Niacin And Related Other (See Comments)   Intense heat    Statins Other (See Comments)   Extreme headaches   Tramadol Anxiety   Felt as if he couldn't breathe/panic    Vicodin [hydrocodone-acetaminophen] Anxiety   Felt as if he couldn't breathe/panic       Medication List    TAKE these medications   acetaminophen 500 MG tablet Commonly known as: TYLENOL Take 500 mg by mouth every 6 (six) hours as needed (for pain.).   methocarbamol 500 MG tablet Commonly known as: Robaxin Take 1 tablet (500 mg total) by mouth every 6 (six) hours as needed for muscle spasms. What changed: Another medication with the same name was added. Make sure you understand how and when to take each.   methocarbamol 500 MG tablet Commonly known as: ROBAXIN Take 1 tablet (500 mg total) by mouth every 6 (six) hours as needed for muscle spasms. What changed: You were already taking a medication with the same name, and this prescription was added. Make sure you understand how and when to take each.   ondansetron 4 MG disintegrating tablet Commonly known as: Zofran ODT Take 1 tablet (4 mg total) by mouth every 8 (eight) hours as needed.   oxyCODONE-acetaminophen 5-325 MG tablet Commonly known as: PERCOCET/ROXICET Take 1-2 tablets by mouth every 4 (four) hours as needed for moderate pain or severe pain. What changed:   how much to  take  reasons to take this   phenazopyridine 200 MG tablet Commonly known as: Pyridium Take 1 tablet (200 mg total) by mouth 3 (three) times daily as needed for pain.   rosuvastatin 5 MG tablet Commonly known as: CRESTOR TAKE 1/2 TABLET BY MOUTH AT BEDTIME EVERY OTHER DAY**PATIENT NEEDS APT FOR FURTHER REFILLS**   Vitamin D (Ergocalciferol) 1.25 MG (50000 UNIT) Caps capsule Commonly known as: DRISDOL Take one tablet wkly       Diagnostic Studies: DG Lumbar Spine 2-3 Views  Result Date: 08/21/2019 CLINICAL DATA:  L4 and L5 posterior fixation  EXAM: DG C-ARM 1-60 MIN; LUMBAR SPINE - 2-3 VIEW FLUOROSCOPY TIME:  Fluoroscopy Time: 0 minutes 51 seconds Number of Acquired Spot Images: 2 COMPARISON:  Lumbar MRI June 12, 2019; intraoperative lateral lumbar radiograph Aug 21, 2019 FINDINGS: Frontal and lateral views were obtained. There is posterior pedicle screw and plate fixation at L4 and L5 with disc spacer at L4-5. Support hardware intact. No fracture or spondylolisthesis. Disc space narrowing remains at L5-S1. Other disc spaces which are visualized appear unremarkable. IMPRESSION: Status post posterior screw and plate fixation at L4 and L5 with support hardware intact. Disc spacer present at L4-5. No fracture or spondylolisthesis. Disc space narrowing remains at L5-S1. Electronically Signed   By: Bretta Bang III M.D.   On: 08/21/2019 13:42   DG Lumbar Spine 1 View  Result Date: 08/21/2019 CLINICAL DATA:  Lumbar spine surgery EXAM: LUMBAR SPINE - 1 VIEW COMPARISON:  Portable exam 0914 hours compared to 04/25/2019 FINDINGS: Five lumbar vertebra labeled on prior MRI of 01/12/2019 Metallic probes via dorsal approach project dorsal to the spinous process of L5 and dorsal to the L3-L4 disc space. IMPRESSION: Dorsal localization of the mid L5 level and L3-L4 disc space level. Electronically Signed   By: Ulyses Southward M.D.   On: 08/21/2019 13:37   DG C-Arm 1-60 Min  Result Date: 08/21/2019 CLINICAL DATA:  L4 and L5 posterior fixation EXAM: DG C-ARM 1-60 MIN; LUMBAR SPINE - 2-3 VIEW FLUOROSCOPY TIME:  Fluoroscopy Time: 0 minutes 51 seconds Number of Acquired Spot Images: 2 COMPARISON:  Lumbar MRI June 12, 2019; intraoperative lateral lumbar radiograph Aug 21, 2019 FINDINGS: Frontal and lateral views were obtained. There is posterior pedicle screw and plate fixation at L4 and L5 with disc spacer at L4-5. Support hardware intact. No fracture or spondylolisthesis. Disc space narrowing remains at L5-S1. Other disc spaces which are visualized appear  unremarkable. IMPRESSION: Status post posterior screw and plate fixation at L4 and L5 with support hardware intact. Disc spacer present at L4-5. No fracture or spondylolisthesis. Disc space narrowing remains at L5-S1. Electronically Signed   By: Bretta Bang III M.D.   On: 08/21/2019 13:42    Disposition: Discharge disposition: 01-Home or Self Care        POD #1 s/p revision lumbar decompression and fusion  - up with PT/OT, encourage ambulation - Percocet for pain, Robaxin for muscle spasms -Scripts for pain sent to pharmacy electronically  -D/C instructions sheet printed and in chart -D/C today  -F/U in office 2 weeks   Signed: Eilene Ghazi McKenzie 08/28/2019, 2:06 PM

## 2019-12-02 ENCOUNTER — Telehealth: Payer: Self-pay | Admitting: Physician Assistant

## 2019-12-02 NOTE — Telephone Encounter (Signed)
Patient given information from the CDC in regards to covid vaccines:   Common Side Effects On the arm where you got the shot: WhatExpectafterVaccinationAnimation_pain Pain Redness Swelling Throughout the rest of your body: WhatExpectafterVaccinationAnimation_fever Tiredness Headache Muscle pain Chills Fever Nausea  Yes. The Advisory Committee on [employer] (ACIP) and CDC recommended vaccination with the J&J/Janssen COVID-19 Vaccine resume among people 18 years and older. However, women younger than 57 years old especially should be aware of the rare but increased risk of TTS, a serious condition that involves blood clots with low platelets. There are other available COVID-19 vaccine options for which this risk has not been seen.  Patient is aware of the above and verbalized understanding. AS, CMA

## 2019-12-02 NOTE — Telephone Encounter (Signed)
Patient called in wanting information on how safe and effective the coivd vaccines are. I advised him they are all safe and severe side effects are rare due to it being lunch hour. I told him Pfizer is the most effective at preventing covid. He would like to speak with a nurse about more information. Thanks   [phone]

## 2020-02-25 IMAGING — MR MR LUMBAR SPINE W/O CM
6 series · 48 of 48 positions shown · non-contrast
Comparison: Lumbar spine x-rays dated December 13, 2018.

CLINICAL DATA: Low back pain extending into both legs for the past
3 months. No prior surgery.

EXAM:
MRI LUMBAR SPINE WITHOUT CONTRAST
TECHNIQUE: Multiplanar, multisequence MR imaging of the lumbar spine was
performed. No intravenous contrast was administered.

[Series 3: T2 · sagittal · 4.0mm · 1.02mm/px · 6 of 15 slices shown (1 of 2)]
[im 1/15]
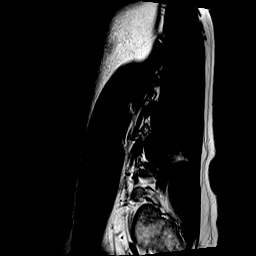
[im 3/15]
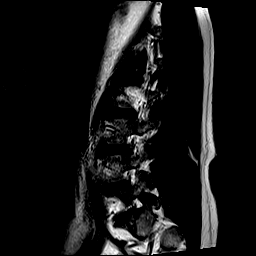
[im 6/15]
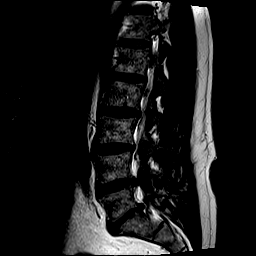
[im 9/15]
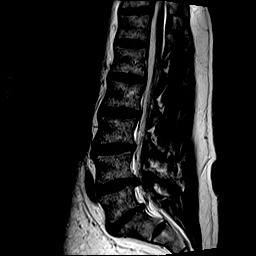
[im 12/15]
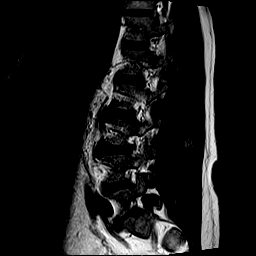
[im 15/15]
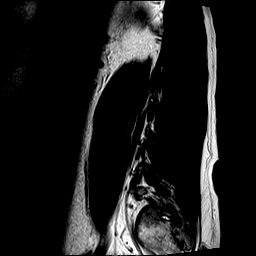

[Series 4: STIR · sagittal · 4.0mm · 1.02mm/px · 5 of 15 slices shown]
[im 1/15]
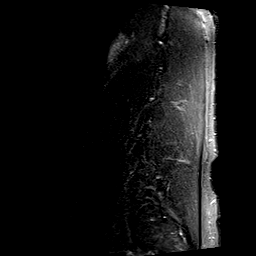
[im 4/15]
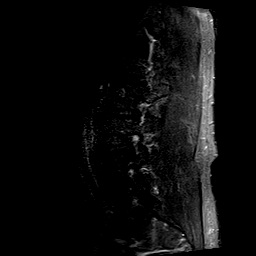
[im 8/15]
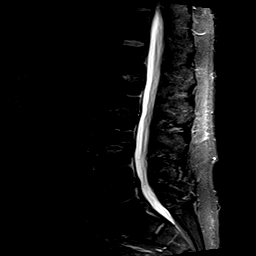
[im 11/15]
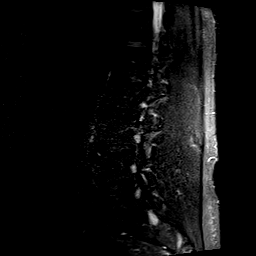
[im 15/15]
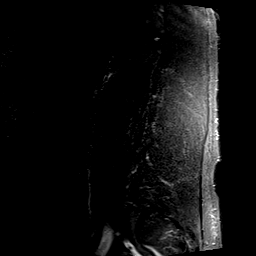

[Series 5: T1 · sagittal · 4.0mm · 1.02mm/px · 5 of 15 slices shown (1 of 2)]
[im 1/15]
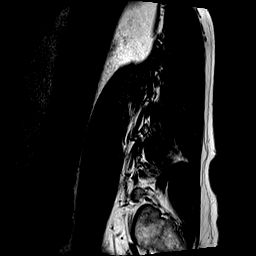
[im 4/15]
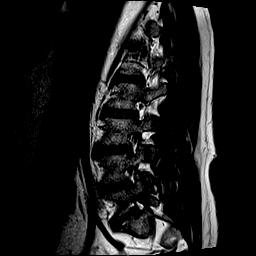
[im 8/15]
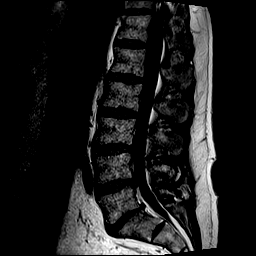
[im 11/15]
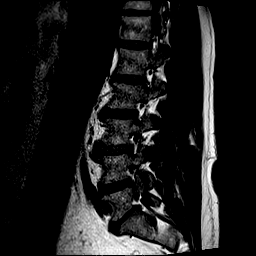
[im 15/15]
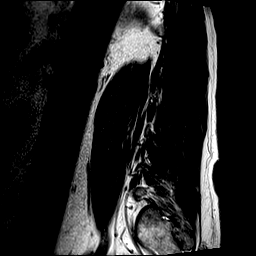

[Series 6: T2 · axial · 4.0mm · 0.78mm/px · z∈[-161,+52]mm · 14 of 38 slices shown (2 of 2)]
[im 1/38]
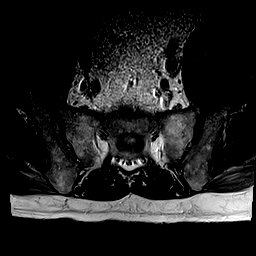
[im 3/38]
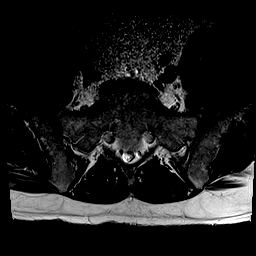
[im 6/38]
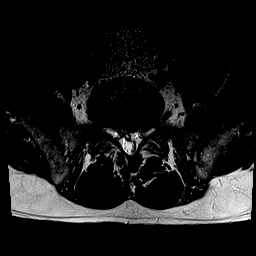
[im 9/38]
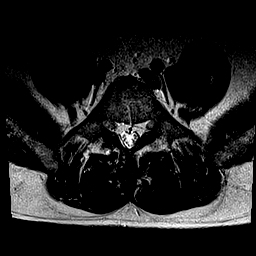
[im 12/38]
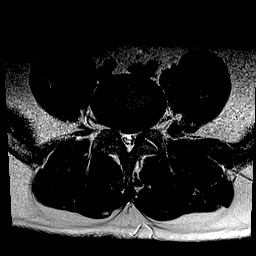
[im 15/38]
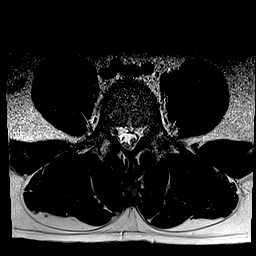
[im 18/38]
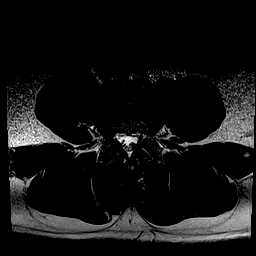
[im 20/38]
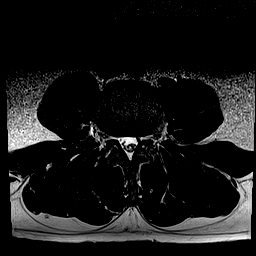
[im 23/38]
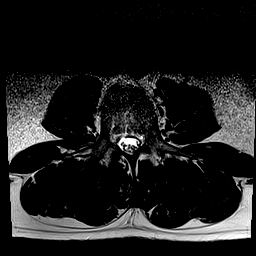
[im 26/38]
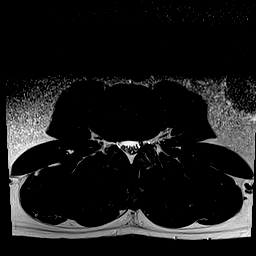
[im 29/38]
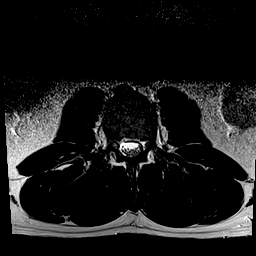
[im 32/38]
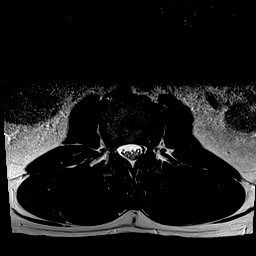
[im 35/38]
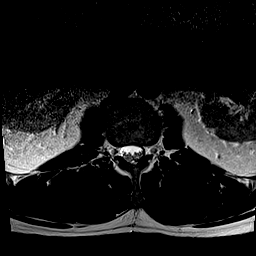
[im 38/38]
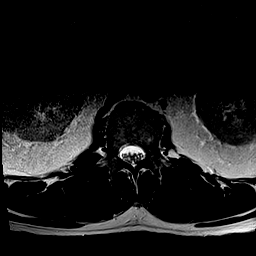

[Series 7: T1 · axial · 4.0mm · 0.78mm/px · z∈[-161,+52]mm · 14 of 38 slices shown (2 of 2)]
[im 1/38]
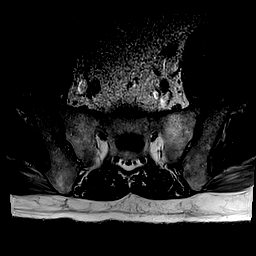
[im 3/38]
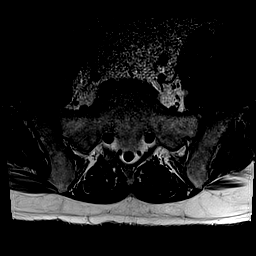
[im 6/38]
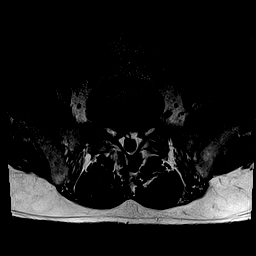
[im 9/38]
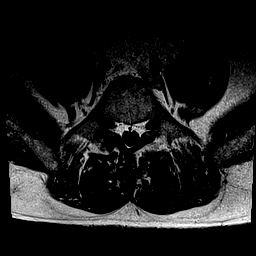
[im 12/38]
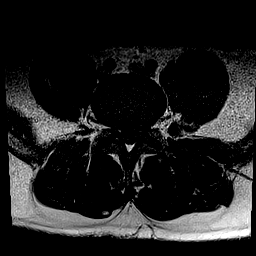
[im 15/38]
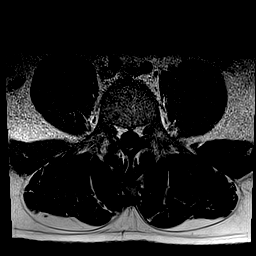
[im 18/38]
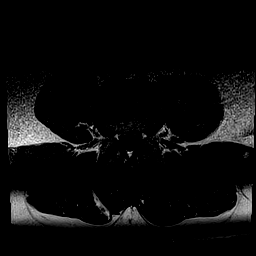
[im 20/38]
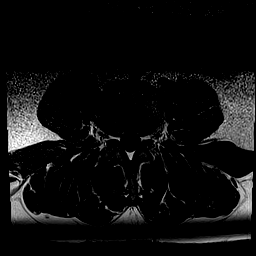
[im 23/38]
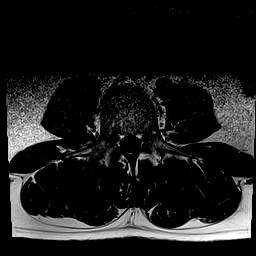
[im 26/38]
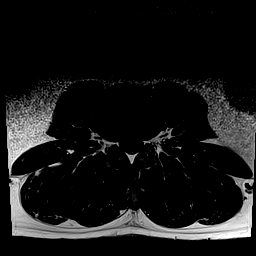
[im 29/38]
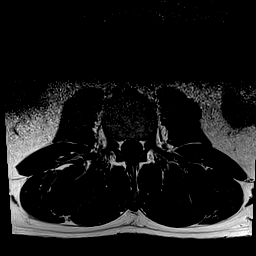
[im 32/38]
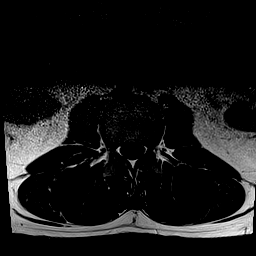
[im 35/38]
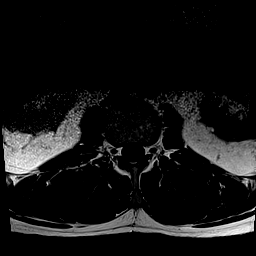
[im 38/38]
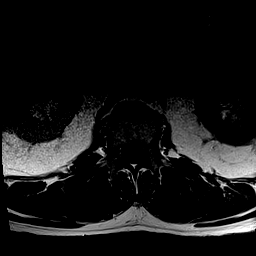

[Series 100: hx · axial · 10.0mm · 0.94mm/px · z∈[-12,+180]mm · 4 of 10 slices shown]
[im 1/10]
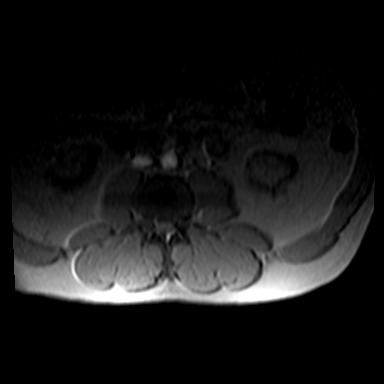
[im 4/10]
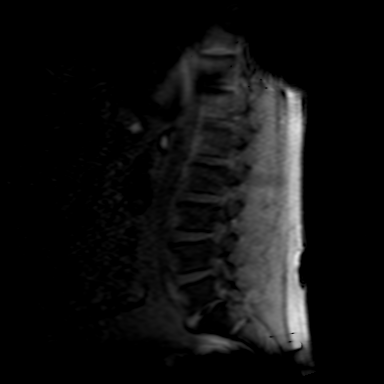
[im 7/10]
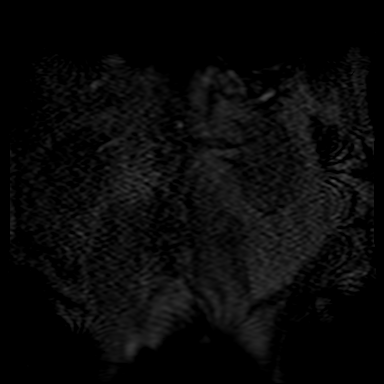
[im 10/10]
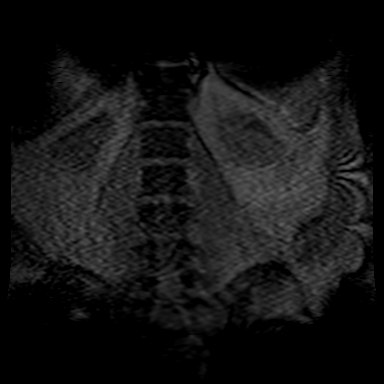

[48 of 48 positions shown; findings below may reference images not displayed]

FINDINGS: Segmentation:  Standard.

Alignment:  Physiologic.

Vertebrae:  No fracture, evidence of discitis, or bone lesion.

Conus medullaris and cauda equina: Conus extends to the L1 level.
Conus and cauda equina appear normal.

Paraspinal and other soft tissues: Negative.

Disc levels:

T12-L1:  Negative.

L1-L2: Disc desiccation without disc bulge or herniation. No
stenosis.

L2-L3:  Minimal diffuse disc bulging.  No stenosis.

L3-L4:  Minimal diffuse disc bulging.  No stenosis.

L4-L5: Mild diffuse disc bulging with superimposed central disc
protrusion. Mild bilateral facet arthropathy. Moderate to severe
bilateral lateral recess stenosis with impingement of the descending
L5 nerve roots. Moderate spinal canal stenosis. No neuroforaminal
stenosis.

L5-S1: Mild diffuse disc bulging and endplate spurring. Mild
bilateral facet arthropathy. No stenosis.
IMPRESSION: 1. Central disc protrusion at L4-L5 with bilateral lateral recess
stenosis and impingement of the descending L5 nerve roots.

## 2020-08-27 ENCOUNTER — Telehealth: Payer: Self-pay | Admitting: Physician Assistant

## 2020-08-27 NOTE — Telephone Encounter (Signed)
error 

## 2020-09-10 ENCOUNTER — Other Ambulatory Visit: Payer: Self-pay

## 2020-09-10 ENCOUNTER — Encounter: Payer: Self-pay | Admitting: Nurse Practitioner

## 2020-09-10 ENCOUNTER — Ambulatory Visit: Payer: Managed Care, Other (non HMO) | Admitting: Nurse Practitioner

## 2020-09-10 VITALS — BP 129/80 | HR 79 | Temp 98.3°F | Ht 70.0 in | Wt 225.5 lb

## 2020-09-10 DIAGNOSIS — K219 Gastro-esophageal reflux disease without esophagitis: Secondary | ICD-10-CM | POA: Diagnosis not present

## 2020-09-10 DIAGNOSIS — H9203 Otalgia, bilateral: Secondary | ICD-10-CM

## 2020-09-10 DIAGNOSIS — H9313 Tinnitus, bilateral: Secondary | ICD-10-CM | POA: Diagnosis not present

## 2020-09-10 DIAGNOSIS — Z1211 Encounter for screening for malignant neoplasm of colon: Secondary | ICD-10-CM | POA: Diagnosis not present

## 2020-09-10 MED ORDER — OMEPRAZOLE 20 MG PO CPDR: 20.0000 mg | DELAYED_RELEASE_CAPSULE | Freq: Every day | ORAL | 1 refills | Status: DC

## 2020-09-10 MED ORDER — OMEPRAZOLE 20 MG PO CPDR: 20.0000 mg | DELAYED_RELEASE_CAPSULE | Freq: Every day | ORAL | 3 refills | Status: DC

## 2020-09-10 NOTE — Progress Notes (Signed)
Established Patient Office Visit  Subjective:  Patient ID: Danny MussSteven Igarashi, male    DOB: 08/08/1960  Age: 60 y.o. MRN: [id number]  CC:  Chief Complaint  Patient presents with   Medication Refill    HPI Danny Ayers presents for follow up visit. He is reporting a significant increase in GERD. States that many foods he had been able to eat in the past cause him to have symptoms of GERD. He states that GERD is also worse at night while lying down. He states that acid comes up and he feels as though it  is choking him. States that he wakes up coughing and short of breath. Is concerned about developing a pneumonia due to GERD. He does have omeprazole 20mg  which he does take as needed. States that this really helps. States that he did have endoscopy done back in 2006 or 2001 because of the same problem. He was told that there were a few spots of irritation but no other abnormalities. He states that he would like to see GI to find out why these symptoms are so much more severe and frequent.  He also states that he has tinnitus in both of his ears. States that this is bilateral, but left side is worse than right. Has been getting worse over time. Denies ear pain. States that one time he did go to ER because this got so bad. Was told that TP tube was full of fluid. The canals were lavage and tinnitus improved for a while.  The patient denies other concerns or complaints today. He denies chest pain, chest pressure, or shortness of breath. He denies headaches or visual disturbances. He denies abdominal pain, nausea, vomiting, or changes in bowel or bladder habits.    Past Medical History:  Diagnosis Date   Arthritis of low back    GERD (gastroesophageal reflux disease)    High cholesterol    History of kidney stones    Hypertension    diagnosed years ago, lost weight, no longer on medications    Past Surgical History:  Procedure Laterality Date   BICEPS TENDON REPAIR Bilateral     CYSTOSCOPY/URETEROSCOPY/HOLMIUM LASER/STENT PLACEMENT Right 02/06/2019   Procedure: CYSTOSCOPY; RIGHT URETEROSCOPY; RIGHT URETERAL STENT PLACEMENT; BALLOON DILATION OF URETERAL STRICTURE; BASKET STONE EXTRACTION;  Surgeon: Bjorn PippinWrenn, John, MD;  Location: [facility];  Service: Urology;  Laterality: Right;   LUMBAR LAMINECTOMY/DECOMPRESSION MICRODISCECTOMY Left 04/25/2019   Procedure: LEFT SIDED LUMBAR 4-5 MICRODISECTOMY;  Surgeon: Estill Bambergumonski, Mark, MD;  Location: [facility];  Service: Orthopedics;  Laterality: Left;   NECK SURGERY     TRANSFORAMINAL LUMBAR INTERBODY FUSION (TLIF) WITH PEDICLE SCREW FIXATION 1 LEVEL Left 08/21/2019   Procedure: LEFT-SIDED LUMBAR 4- LUMBAR 5 TRANSFORAMINAL LUMBAR INTERBODY FUSION WITH INSTRUMENTATION AND ALLOGRAFT;  Surgeon: Estill Bambergumonski, Mark, MD;  Location: [facility];  Service: Orthopedics;  Laterality: Left;   UPPER GASTROINTESTINAL ENDOSCOPY      Family History  Problem Relation Age of Onset   Prostate cancer Father    Coronary artery disease Father    Diabetes Brother    Prostate cancer Paternal Uncle    Diabetes Paternal Grandmother    Stroke Paternal Aunt     Social History   Socioeconomic History   Marital status: Married    Spouse name: Not on file   Number of children: Not on file   Years of education: Not on file   Highest education level: Not on file  Occupational History   Not on file  Tobacco Use  Smoking status: Never   Smokeless tobacco: Never  Vaping Use   Vaping Use: Never used  Substance and Sexual Activity   Alcohol use: Not Currently   Drug use: Never   Sexual activity: Yes    Partners: Female    Birth control/protection: None  Other Topics Concern   Not on file  Social History Narrative   Not on file   Social Determinants of Health   Financial Resource Strain: Not on file  Food Insecurity: Not on file  Transportation Needs: Not on file  Physical Activity: Not on file  Stress: Not on file  Social Connections: Not on file  Intimate  Partner Violence: Not on file    Outpatient Medications Prior to Visit  Medication Sig Dispense Refill   acetaminophen (TYLENOL) 500 MG tablet Take 500 mg by mouth every 6 (six) hours as needed (for pain.).  (Patient not taking: Reported on 09/10/2020)     methocarbamol (ROBAXIN) 500 MG tablet Take 1 tablet (500 mg total) by mouth every 6 (six) hours as needed for muscle spasms. (Patient not taking: Reported on 09/10/2020) 60 tablet 0   methocarbamol (ROBAXIN) 500 MG tablet Take 1 tablet (500 mg total) by mouth every 6 (six) hours as needed for muscle spasms. (Patient not taking: Reported on 09/10/2020) 30 tablet 2   ondansetron (ZOFRAN ODT) 4 MG disintegrating tablet Take 1 tablet (4 mg total) by mouth every 8 (eight) hours as needed. (Patient not taking: Reported on 09/10/2020) 20 tablet 0   oxyCODONE-acetaminophen (PERCOCET/ROXICET) 5-325 MG tablet Take 1-2 tablets by mouth every 4 (four) hours as needed for moderate pain or severe pain. (Patient not taking: Reported on 09/10/2020) 30 tablet 0   phenazopyridine (PYRIDIUM) 200 MG tablet Take 1 tablet (200 mg total) by mouth 3 (three) times daily as needed for pain. (Patient not taking: Reported on 09/10/2020) 10 tablet 0   rosuvastatin (CRESTOR) 5 MG tablet TAKE 1/2 TABLET BY MOUTH AT BEDTIME EVERY OTHER DAY**PATIENT NEEDS APT FOR FURTHER REFILLS** (Patient not taking: Reported on 09/10/2020) 15 tablet 0   Vitamin D, Ergocalciferol, (DRISDOL) 1.25 MG (50000 UT) CAPS capsule Take one tablet wkly (Patient not taking: Reported on 09/10/2020) 12 capsule 3   No facility-administered medications prior to visit.    Allergies  Allergen Reactions   Niacin And Related Other (See Comments)    Intense heat    Statins Other (See Comments)    Extreme headaches   Tramadol Anxiety    Felt as if he couldn't breathe/panic     Vicodin [Hydrocodone-Acetaminophen] Anxiety    Felt as if he couldn't breathe/panic     ROS Review of Systems  Constitutional:  Negative for  activity change, chills, fatigue and fever.  HENT:  Positive for hearing loss. Negative for congestion, postnasal drip, rhinorrhea, sinus pressure and sinus pain.   Eyes: Negative.   Respiratory:  Positive for choking. Negative for cough, shortness of breath and wheezing.        States that he gets sensation of choking when acid reflux gets bad at night and fluid gets into his throat and mouth.   Cardiovascular:  Negative for chest pain and palpitations.  Gastrointestinal:  Negative for abdominal pain, diarrhea, nausea and vomiting.       Worsening GERD.   Endocrine: Negative for cold intolerance, heat intolerance, polydipsia and polyuria.  Musculoskeletal:  Negative for back pain and myalgias.  Skin:  Negative for rash.  Allergic/Immunologic: Negative.   Neurological:  Negative for dizziness,  weakness and headaches.  Psychiatric/Behavioral:  Negative for sleep disturbance. The patient is not nervous/anxious.      Objective:    Physical Exam Vitals and nursing note reviewed.  Constitutional:      Appearance: Normal appearance. He is well-developed.  HENT:     Head: Normocephalic and atraumatic.     Right Ear: Tympanic membrane is bulging.     Left Ear: Tympanic membrane is bulging.     Ears:     Comments: There is moderate cerumen in left ear canal without impaction or occlusion.     Nose: Nose normal.     Mouth/Throat:     Mouth: Mucous membranes are moist.  Eyes:     Extraocular Movements: Extraocular movements intact.     Conjunctiva/sclera: Conjunctivae normal.     Pupils: Pupils are equal, round, and reactive to light.  Cardiovascular:     Rate and Rhythm: Normal rate and regular rhythm.     Pulses: Normal pulses.     Heart sounds: Normal heart sounds.  Pulmonary:     Effort: Pulmonary effort is normal.     Breath sounds: Normal breath sounds.  Abdominal:     General: Bowel sounds are normal.     Palpations: Abdomen is soft.     Tenderness: There is no abdominal  tenderness. There is no guarding.  Musculoskeletal:        General: Normal range of motion.     Cervical back: Normal range of motion and neck supple.  Lymphadenopathy:     Cervical: No cervical adenopathy.  Skin:    General: Skin is warm and dry.     Capillary Refill: Capillary refill takes less than 2 seconds.  Neurological:     General: No focal deficit present.     Mental Status: He is alert and oriented to person, place, and time.  Psychiatric:        Mood and Affect: Mood normal.        Behavior: Behavior normal.        Thought Content: Thought content normal.        Judgment: Judgment normal.    Today's Vitals   09/10/20 0907  BP: 129/80  Pulse: 79  Temp: 98.3 F (36.8 C)  SpO2: 94%  Weight: 225 lb 8 oz (102.3 kg)  Height: 5\' 10"  (1.778 m)   Body mass index is 32.36 kg/m.   Wt Readings from Last 3 Encounters:  09/10/20 225 lb 8 oz (102.3 kg)  08/21/19 220 lb (99.8 kg)  08/13/19 225 lb 4.8 oz (102.2 kg)     Health Maintenance Due  Topic Date Due   COVID-19 Vaccine (1) Never done   HIV Screening  Never done   Hepatitis C Screening  Never done   TETANUS/TDAP  Never done   COLONOSCOPY (Pts 45-21yrs Insurance coverage will need to be confirmed)  Never done   Zoster Vaccines- Shingrix (1 of 2) Never done    There are no preventive care reminders to display for this patient.  Lab Results  Component Value Date   TSH 2.800 02/21/2018   Lab Results  Component Value Date   WBC 5.5 08/13/2019   HGB 17.6 (H) 08/13/2019   HCT 52.0 08/13/2019   MCV 91.1 08/13/2019   PLT 264 08/13/2019   Lab Results  Component Value Date   NA 140 08/13/2019   K 4.0 08/13/2019   CO2 24 08/13/2019   GLUCOSE 126 (H) 08/13/2019   BUN 13 08/13/2019  CREATININE 1.25 (H) 08/13/2019   BILITOT 1.0 08/13/2019   ALKPHOS 58 08/13/2019   AST 37 08/13/2019   ALT 61 (H) 08/13/2019   PROT 7.2 08/13/2019   ALBUMIN 4.4 08/13/2019   CALCIUM 9.6 08/13/2019   ANIONGAP 10 08/13/2019    Lab Results  Component Value Date   CHOL 246 (H) 02/21/2018   Lab Results  Component Value Date   HDL 33 (L) 02/21/2018   Lab Results  Component Value Date   LDLCALC 167 (H) 02/21/2018   Lab Results  Component Value Date   TRIG 229 (H) 02/21/2018   Lab Results  Component Value Date   CHOLHDL 7.5 (H) 02/21/2018   Lab Results  Component Value Date   HGBA1C 5.9 (H) 02/21/2018      Assessment & Plan:  1. Gastroesophageal reflux disease without esophagitis Renew prescription for omeprazole 20mg  to take as needed for GERD. Encouraged him to avoid triggers. Recommended he sleep with HOB elevated at 30 degrees and to avoid eating for 2 hours prior to bedtime. Refer to GI for further evaluation and treatment.  - Ambulatory referral to Gastroenterology - omeprazole (PRILOSEC) 20 MG capsule; Take 1 capsule (20 mg total) by mouth daily.  Dispense: 90 capsule; Refill: 1  2. Tinnitus of both ears Unclear of cause. There is cerumen in the ear canals but they are not currently impacted. Will refer to ENT for further evaluation. May need to see audiology - Ambulatory referral to ENT  3. Otalgia, bilateral Refer to ENT for further evaluation.  - Ambulatory referral to ENT   4. Screening for colon cancer Refer to GI today for screening colonoscopy  Problem List Items Addressed This Visit       Digestive   Gastroesophageal reflux disease - Primary (Chronic)   Relevant Medications   omeprazole (PRILOSEC) 20 MG capsule   Other Relevant Orders   Ambulatory referral to Gastroenterology     Other   Tinnitus of both ears   Relevant Orders   Ambulatory referral to ENT   Otalgia, bilateral   Relevant Orders   Ambulatory referral to ENT   Screening for colon cancer    Meds ordered this encounter  Medications   DISCONTD: omeprazole (PRILOSEC) 20 MG capsule    Sig: Take 1 capsule (20 mg total) by mouth daily.    Dispense:  30 capsule    Refill:  3    Order Specific Question:    Supervising Provider    Answer:   D [[id number]]   omeprazole (PRILOSEC) 20 MG capsule    Sig: Take 1 capsule (20 mg total) by mouth daily.    Dispense:  90 capsule    Refill:  1    Please fill as 90 day prescription and please do not run through patient's insurance. Thanks.    Order Specific Question:   Supervising Provider    Answer:   Nani Gasser D [[id number]]    Follow-up: Return in about 6 months (around 03/12/2021) for physicall- needs FBW prior to visit .    14/12/2020, NP

## 2020-09-17 ENCOUNTER — Telehealth: Payer: Self-pay | Admitting: Physician Assistant

## 2020-09-17 NOTE — Telephone Encounter (Signed)
Please offer patient apt tomorrow with [facility] or advise him to go to [facility] if unable to wait until appointment. AS, CMA

## 2020-09-17 NOTE — Telephone Encounter (Signed)
Patient is experiencing what he believes to be an ear infection. He was in office last week. His ear is painful and tender and the right side of his face is tender due to his ear he believes. He asked for some drops or advice. Please advise, thanks.

## 2020-09-18 ENCOUNTER — Other Ambulatory Visit: Payer: Self-pay

## 2020-09-18 ENCOUNTER — Ambulatory Visit (INDEPENDENT_AMBULATORY_CARE_PROVIDER_SITE_OTHER): Payer: Managed Care, Other (non HMO) | Admitting: Nurse Practitioner

## 2020-09-18 ENCOUNTER — Encounter: Payer: Self-pay | Admitting: Nurse Practitioner

## 2020-09-18 VITALS — BP 126/81 | HR 95 | Temp 98.2°F | Ht 70.0 in | Wt 225.3 lb

## 2020-09-18 DIAGNOSIS — H9313 Tinnitus, bilateral: Secondary | ICD-10-CM

## 2020-09-18 DIAGNOSIS — H70001 Acute mastoiditis without complications, right ear: Secondary | ICD-10-CM | POA: Diagnosis not present

## 2020-09-18 MED ORDER — AMOXICILLIN-POT CLAVULANATE 875-125 MG PO TABS: 1.0000 | ORAL_TABLET | Freq: Two times a day (BID) | ORAL | 0 refills | Status: DC

## 2020-09-18 NOTE — Progress Notes (Signed)
Established Patient Office Visit  Subjective:  Patient ID: Danny Ayers, male    DOB: December 18, 1960  Age: 60 y.o. MRN: [id number]  CC:  Chief Complaint  Patient presents with   Acute Visit    Right Ear Pain    HPI  Danny Ayers presents for evaluation of right cheek pain.  He also complains of headache.  He states this has been going on for a few days.  He did mention having tinnitus in both ears at his initial visit with me.  Since then he has developed ear pain on the right side which is now radiating into the right cheek and right jaw.  He states that pain is so severe he is unable to lay on his right side.  He has been referred to an ear nose and throat provider.  He is waiting on this referral.  He denies fever, chills, body aches, or muscle pain.  He denies nausea vomiting or diarrhea.  He has not taken any over-the-counter treatment for this.  Past Medical History:  Diagnosis Date   Arthritis of low back    GERD (gastroesophageal reflux disease)    High cholesterol    History of kidney stones    Hypertension    diagnosed years ago, lost weight, no longer on medications    Past Surgical History:  Procedure Laterality Date   BICEPS TENDON REPAIR Bilateral    CYSTOSCOPY/URETEROSCOPY/HOLMIUM LASER/STENT PLACEMENT Right 02/06/2019   Procedure: CYSTOSCOPY; RIGHT URETEROSCOPY; RIGHT URETERAL STENT PLACEMENT; BALLOON DILATION OF URETERAL STRICTURE; BASKET STONE EXTRACTION;  Surgeon: Irine Seal, MD;  Location: [facility];  Service: Urology;  Laterality: Right;   LUMBAR LAMINECTOMY/DECOMPRESSION MICRODISCECTOMY Left 04/25/2019   Procedure: LEFT SIDED LUMBAR 4-5 MICRODISECTOMY;  Surgeon: Phylliss Bob, MD;  Location: [facility];  Service: Orthopedics;  Laterality: Left;   NECK SURGERY     TRANSFORAMINAL LUMBAR INTERBODY FUSION (TLIF) WITH PEDICLE SCREW FIXATION 1 LEVEL Left 08/21/2019   Procedure: LEFT-SIDED LUMBAR 4- LUMBAR 5 TRANSFORAMINAL LUMBAR INTERBODY FUSION WITH INSTRUMENTATION AND  ALLOGRAFT;  Surgeon: Phylliss Bob, MD;  Location: [facility];  Service: Orthopedics;  Laterality: Left;   UPPER GASTROINTESTINAL ENDOSCOPY      Family History  Problem Relation Age of Onset   Prostate cancer Father    Coronary artery disease Father    Diabetes Brother    Prostate cancer Paternal Uncle    Diabetes Paternal Grandmother    Stroke Paternal Aunt     Social History   Socioeconomic History   Marital status: Married    Spouse name: Not on file   Number of children: Not on file   Years of education: Not on file   Highest education level: Not on file  Occupational History   Not on file  Tobacco Use   Smoking status: Never   Smokeless tobacco: Never  Vaping Use   Vaping Use: Never used  Substance and Sexual Activity   Alcohol use: Not Currently   Drug use: Never   Sexual activity: Yes    Partners: Female    Birth control/protection: None  Other Topics Concern   Not on file  Social History Narrative   Not on file   Social Determinants of Health   Financial Resource Strain: Not on file  Food Insecurity: Not on file  Transportation Needs: Not on file  Physical Activity: Not on file  Stress: Not on file  Social Connections: Not on file  Intimate Partner Violence: Not on file    Outpatient Medications  Prior to Visit  Medication Sig Dispense Refill   omeprazole (PRILOSEC) 20 MG capsule Take 1 capsule (20 mg total) by mouth daily. 90 capsule 1   acetaminophen (TYLENOL) 500 MG tablet Take 500 mg by mouth every 6 (six) hours as needed (for pain.).     methocarbamol (ROBAXIN) 500 MG tablet Take 1 tablet (500 mg total) by mouth every 6 (six) hours as needed for muscle spasms. 30 tablet 2   ondansetron (ZOFRAN ODT) 4 MG disintegrating tablet Take 1 tablet (4 mg total) by mouth every 8 (eight) hours as needed. 20 tablet 0   oxyCODONE-acetaminophen (PERCOCET/ROXICET) 5-325 MG tablet Take 1-2 tablets by mouth every 4 (four) hours as needed for moderate pain or severe  pain. 30 tablet 0   phenazopyridine (PYRIDIUM) 200 MG tablet Take 1 tablet (200 mg total) by mouth 3 (three) times daily as needed for pain. 10 tablet 0   rosuvastatin (CRESTOR) 5 MG tablet TAKE 1/2 TABLET BY MOUTH AT BEDTIME EVERY OTHER DAY**PATIENT NEEDS APT FOR FURTHER REFILLS** 15 tablet 0   Vitamin D, Ergocalciferol, (DRISDOL) 1.25 MG (50000 UT) CAPS capsule Take one tablet wkly 12 capsule 3   No facility-administered medications prior to visit.    Allergies  Allergen Reactions   Niacin And Related Other (See Comments)    Intense heat    Statins Other (See Comments)    Extreme headaches   Tramadol Anxiety    Felt as if he couldn't breathe/panic     Vicodin [Hydrocodone-Acetaminophen] Anxiety    Felt as if he couldn't breathe/panic     ROS Review of Systems  Constitutional:  Negative for chills, fatigue and fever.  HENT:  Positive for ear pain, sinus pressure and sinus pain. Negative for congestion, postnasal drip and rhinorrhea.        Pain in right ear and right jaw.  Eyes: Negative.   Respiratory:  Positive for chest tightness. Negative for cough, shortness of breath and wheezing.   Cardiovascular:  Negative for chest pain and palpitations.  Gastrointestinal:  Negative for abdominal pain, constipation, diarrhea, nausea and vomiting.  Endocrine: Negative for cold intolerance, heat intolerance, polydipsia and polyuria.  Musculoskeletal:  Positive for arthralgias. Negative for back pain and myalgias.       Right-sided jaw pain.  Skin:  Negative for rash.  Allergic/Immunologic: Positive for environmental allergies.  Neurological:  Positive for headaches. Negative for dizziness and weakness.  Psychiatric/Behavioral:  Negative for dysphoric mood. The patient is not nervous/anxious.      Objective:    Physical Exam Vitals and nursing note reviewed.  Constitutional:      Appearance: Normal appearance. He is well-developed. He is obese. He is ill-appearing.  HENT:      Head: Normocephalic and atraumatic.     Jaw: Tenderness and pain on movement present.     Comments: There is tenderness with palpation of the right side of the jaw and cheek.  Increased pain with opening and clenching the jaw.  No swelling present.    Right Ear: Ear canal and external ear normal. Tympanic membrane is erythematous. Tympanic membrane is not bulging.     Left Ear: Ear canal and external ear normal. Tympanic membrane is not erythematous or bulging.     Nose:     Right Sinus: Maxillary sinus tenderness present. No frontal sinus tenderness.     Left Sinus: No maxillary sinus tenderness or frontal sinus tenderness.     Mouth/Throat:     Mouth: Mucous membranes  are moist.  Eyes:     Extraocular Movements: Extraocular movements intact.     Conjunctiva/sclera: Conjunctivae normal.     Pupils: Pupils are equal, round, and reactive to light.  Cardiovascular:     Rate and Rhythm: Normal rate and regular rhythm.     Pulses: Normal pulses.     Heart sounds: Normal heart sounds.  Pulmonary:     Effort: Pulmonary effort is normal.     Breath sounds: Normal breath sounds.  Abdominal:     Palpations: Abdomen is soft.  Musculoskeletal:        General: Normal range of motion.     Cervical back: Normal range of motion and neck supple.  Lymphadenopathy:     Cervical: No cervical adenopathy.  Skin:    General: Skin is warm and dry.     Capillary Refill: Capillary refill takes less than 2 seconds.  Neurological:     General: No focal deficit present.     Mental Status: He is alert and oriented to person, place, and time.  Psychiatric:        Mood and Affect: Mood normal.        Behavior: Behavior normal.        Thought Content: Thought content normal.        Judgment: Judgment normal.    Today's Vitals   09/18/20 0835  BP: 126/81  Pulse: 95  Temp: 98.2 F (36.8 C)  SpO2: 95%  Weight: 225 lb 4.8 oz (102.2 kg)  Height: _0  (1.778 m)   Body mass index is 32.33 kg/m.   Wt  Readings from Last 3 Encounters:  09/24/20 226 lb 12.8 oz (102.9 kg)  09/18/20 225 lb 4.8 oz (102.2 kg)  09/10/20 225 lb 8 oz (102.3 kg)     Health Maintenance Due  Topic Date Due   HIV Screening  Never done   Hepatitis C Screening  Never done   TETANUS/TDAP  Never done   COVID-19 Vaccine (2 - Moderna series) 04/07/2020    There are no preventive care reminders to display for this patient.  Lab Results  Component Value Date   TSH 2.100 09/24/2020   Lab Results  Component Value Date   WBC 5.8 09/24/2020   HGB 17.0 09/24/2020   HCT 49.2 09/24/2020   MCV 92 09/24/2020   PLT 275 09/24/2020   Lab Results  Component Value Date   NA 139 09/24/2020   K 4.7 09/24/2020   CO2 22 09/24/2020   GLUCOSE 115 (H) 09/24/2020   BUN 12 09/24/2020   CREATININE 1.06 09/24/2020   BILITOT 0.5 09/24/2020   ALKPHOS 81 09/24/2020   AST 40 09/24/2020   ALT 62 (H) 09/24/2020   PROT 6.8 09/24/2020   ALBUMIN 4.6 09/24/2020   CALCIUM 9.4 09/24/2020   ANIONGAP 10 08/13/2019   EGFR 82 09/24/2020   Lab Results  Component Value Date   CHOL 261 (H) 09/24/2020   Lab Results  Component Value Date   HDL 30 (L) 09/24/2020   Lab Results  Component Value Date   LDLCALC 172 (H) 09/24/2020   Lab Results  Component Value Date   TRIG 306 (H) 09/24/2020   Lab Results  Component Value Date   CHOLHDL 8.7 (H) 09/24/2020   Lab Results  Component Value Date   HGBA1C 5.9 (H) 02/21/2018      Assessment & Plan:  1. Acute mastoiditis of right side Start Augmentin 875 mg twice daily for next 7 days.  Advised him to take Tylenol or ibuprofen for pain and inflammation.  Encouraged him to apply warm compress to the right side when needed.  Rest and increase fluids.  2. Tinnitus of both ears Patient will establish care with ear nose and throat provider when scheduled.  Problem List Items Addressed This Visit       Other   Tinnitus of both ears   Other Visit Diagnoses     Acute mastoiditis  of right side    -  Primary       Meds ordered this encounter  Medications   DISCONTD: amoxicillin-clavulanate (AUGMENTIN) 875-125 MG tablet    Sig: Take 1 tablet by mouth 2 (two) times daily.    Dispense:  14 tablet    Refill:  0    Order Specific Question:   Supervising Provider    Answer:   Beatrice Lecher D [[id number]]    Follow-up: Return in about 1 week (around 09/25/2020) for bp and intermittent chest discomfort .    Ronnell Freshwater, NP

## 2020-09-24 ENCOUNTER — Ambulatory Visit: Payer: Managed Care, Other (non HMO) | Admitting: Nurse Practitioner

## 2020-09-24 ENCOUNTER — Other Ambulatory Visit: Payer: Self-pay

## 2020-09-24 ENCOUNTER — Encounter: Payer: Self-pay | Admitting: Nurse Practitioner

## 2020-09-24 VITALS — BP 128/83 | HR 81 | Temp 98.3°F | Ht 70.0 in | Wt 226.8 lb

## 2020-09-24 DIAGNOSIS — E782 Mixed hyperlipidemia: Secondary | ICD-10-CM | POA: Diagnosis not present

## 2020-09-24 DIAGNOSIS — Z125 Encounter for screening for malignant neoplasm of prostate: Secondary | ICD-10-CM | POA: Diagnosis not present

## 2020-09-24 DIAGNOSIS — M26621 Arthralgia of right temporomandibular joint: Secondary | ICD-10-CM

## 2020-09-24 DIAGNOSIS — R079 Chest pain, unspecified: Secondary | ICD-10-CM | POA: Diagnosis not present

## 2020-09-24 DIAGNOSIS — Z0000 Encounter for general adult medical examination without abnormal findings: Secondary | ICD-10-CM | POA: Insufficient documentation

## 2020-09-24 MED ORDER — CYCLOBENZAPRINE HCL 10 MG PO TABS: ORAL_TABLET | ORAL | 1 refills | Status: DC

## 2020-09-24 MED ORDER — DICLOFENAC SODIUM 75 MG PO TBEC: 75.0000 mg | DELAYED_RELEASE_TABLET | Freq: Two times a day (BID) | ORAL | 1 refills | Status: DC | PRN

## 2020-09-24 NOTE — Progress Notes (Signed)
Reviewed with patient at time of visit.

## 2020-09-24 NOTE — Progress Notes (Signed)
Established Patient Office Visit  Subjective:  Patient ID: Danny Ayers, male    DOB: October 04, 1960  Age: 60 y.o. MRN: [id number]  CC:  Chief Complaint  Patient presents with   Blood Pressure Check    HPI Danny Ayers presents for follow up visit. He states that he has been having intermittent chest discomfort. He states that this has been going on for years. He had two surgeries on his back in 2020 and 2021 and since then, the intermittent chest discomfort has been worse. It is happening more frequently and is lasting for longer periods of time. He states that this is happening about once per month. States that the pain is sometimes substernal and sometimes in upper part of his back between there shoulder blades. Chest pain will last for a few minutes then gradually subside. He cannot think of anything that he does that makes the pain worse. States that he has to sit very still and breathe deeply until the pain subsides. He denies shortness of breath, headache, nausea, or dizziness associated with these episodes. He does get very hot and diaphoretic when this happens.  He continues to have right ear pain. Recent round of antibiotics did not help. He states that pain is radiating into the right jaw and upper part of the neck. He states that pain is also present behind the right eye. He states that the pain behind the eye is about 2/10 in severity and is present at all times. He states that the pain in ear and jaw is intermittent and describes it as throbbing. 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 He has also had surgery on the neck and has plates to support the spine.  He should have routine, fasting labs.   Past Medical History:  Diagnosis Date   Arthritis of low back    GERD (gastroesophageal reflux disease)    High cholesterol    History of kidney stones    Hypertension    diagnosed years ago, lost weight, no longer on medications    Past Surgical History:  Procedure  Laterality Date   BICEPS TENDON REPAIR Bilateral    CYSTOSCOPY/URETEROSCOPY/HOLMIUM LASER/STENT PLACEMENT Right 02/06/2019   Procedure: CYSTOSCOPY; RIGHT URETEROSCOPY; RIGHT URETERAL STENT PLACEMENT; BALLOON DILATION OF URETERAL STRICTURE; BASKET STONE EXTRACTION;  Surgeon: Bjorn Pippin, MD;  Location: [facility];  Service: Urology;  Laterality: Right;   LUMBAR LAMINECTOMY/DECOMPRESSION MICRODISCECTOMY Left 04/25/2019   Procedure: LEFT SIDED LUMBAR 4-5 MICRODISECTOMY;  Surgeon: Estill Bamberg, MD;  Location: [facility];  Service: Orthopedics;  Laterality: Left;   NECK SURGERY     TRANSFORAMINAL LUMBAR INTERBODY FUSION (TLIF) WITH PEDICLE SCREW FIXATION 1 LEVEL Left 08/21/2019   Procedure: LEFT-SIDED LUMBAR 4- LUMBAR 5 TRANSFORAMINAL LUMBAR INTERBODY FUSION WITH INSTRUMENTATION AND ALLOGRAFT;  Surgeon: Estill Bamberg, MD;  Location: [facility];  Service: Orthopedics;  Laterality: Left;   UPPER GASTROINTESTINAL ENDOSCOPY      Family History  Problem Relation Age of Onset   Prostate cancer Father    Coronary artery disease Father    Diabetes Brother    Prostate cancer Paternal Uncle    Diabetes Paternal Grandmother    Stroke Paternal Aunt     Social History   Socioeconomic History   Marital status: Married    Spouse name: Not on file   Number of children: Not on file   Years of education: Not on file   Highest education level: Not on file  Occupational History  Not on file  Tobacco Use   Smoking status: Never   Smokeless tobacco: Never  Vaping Use   Vaping Use: Never used  Substance and Sexual Activity   Alcohol use: Not Currently   Drug use: Never   Sexual activity: Yes    Partners: Female    Birth control/protection: None  Other Topics Concern   Not on file  Social History Narrative   Not on file   Social Determinants of Health   Financial Resource Strain: Not on file  Food Insecurity: Not on file  Transportation Needs: Not on file  Physical Activity: Not on file  Stress: Not on  file  Social Connections: Not on file  Intimate Partner Violence: Not on file    Outpatient Medications Prior to Visit  Medication Sig Dispense Refill   acetaminophen (TYLENOL) 500 MG tablet Take 500 mg by mouth every 6 (six) hours as needed (for pain.).     omeprazole (PRILOSEC) 20 MG capsule Take 1 capsule (20 mg total) by mouth daily. 90 capsule 1   amoxicillin-clavulanate (AUGMENTIN) 875-125 MG tablet Take 1 tablet by mouth 2 (two) times daily. 14 tablet 0   methocarbamol (ROBAXIN) 500 MG tablet Take 1 tablet (500 mg total) by mouth every 6 (six) hours as needed for muscle spasms. 30 tablet 2   ondansetron (ZOFRAN ODT) 4 MG disintegrating tablet Take 1 tablet (4 mg total) by mouth every 8 (eight) hours as needed. 20 tablet 0   oxyCODONE-acetaminophen (PERCOCET/ROXICET) 5-325 MG tablet Take 1-2 tablets by mouth every 4 (four) hours as needed for moderate pain or severe pain. 30 tablet 0   phenazopyridine (PYRIDIUM) 200 MG tablet Take 1 tablet (200 mg total) by mouth 3 (three) times daily as needed for pain. 10 tablet 0   rosuvastatin (CRESTOR) 5 MG tablet TAKE 1/2 TABLET BY MOUTH AT BEDTIME EVERY OTHER DAY**PATIENT NEEDS APT FOR FURTHER REFILLS** 15 tablet 0   Vitamin D, Ergocalciferol, (DRISDOL) 1.25 MG (50000 UT) CAPS capsule Take one tablet wkly 12 capsule 3   No facility-administered medications prior to visit.    Allergies  Allergen Reactions   Niacin And Related Other (See Comments)    Intense heat    Statins Other (See Comments)    Extreme headaches   Tramadol Anxiety    Felt as if he couldn't breathe/panic     Vicodin [Hydrocodone-Acetaminophen] Anxiety    Felt as if he couldn't breathe/panic     ROS Review of Systems  Constitutional:  Negative for activity change, chills, fatigue and fever.  HENT:  Positive for ear pain and tinnitus. Negative for congestion, postnasal drip, rhinorrhea and sinus pressure.   Eyes: Negative.   Respiratory:  Negative for chest tightness  and shortness of breath.   Cardiovascular:  Positive for chest pain. Negative for palpitations.       Chest pain is occurring about once monthly.  Gastrointestinal:  Negative for constipation, diarrhea, nausea and vomiting.  Endocrine: Negative for cold intolerance, heat intolerance, polydipsia and polyuria.  Musculoskeletal:  Positive for arthralgias and myalgias. Negative for back pain.       Right jaw pain.   Skin:  Negative for rash.  Allergic/Immunologic: Negative.   Neurological:  Negative for dizziness, weakness and headaches.  Psychiatric/Behavioral:  Negative for dysphoric mood. The patient is not nervous/anxious.      Objective:    Physical Exam Vitals and nursing note reviewed.  Constitutional:      Appearance: Normal appearance. He is well-developed.  HENT:     Head: Normocephalic and atraumatic.     Right Ear: Ear canal and external ear normal. Tenderness present.     Left Ear: Ear canal and external ear normal.     Nose: Nose normal.     Mouth/Throat:     Mouth: Mucous membranes are moist.  Eyes:     Extraocular Movements: Extraocular movements intact.     Conjunctiva/sclera: Conjunctivae normal.     Pupils: Pupils are equal, round, and reactive to light.  Neck:     Vascular: No carotid bruit.  Cardiovascular:     Rate and Rhythm: Normal rate and regular rhythm.     Pulses: Normal pulses.     Heart sounds: Normal heart sounds.     Comments: ECG done in the office today demonstrates normal sinus rhythm with nonspecific T wave abnormality. It is considered an abnormal ECG.  Pulmonary:     Effort: Pulmonary effort is normal.     Breath sounds: Normal breath sounds.  Abdominal:     Palpations: Abdomen is soft.  Musculoskeletal:        General: Normal range of motion.     Cervical back: Normal range of motion and neck supple.     Comments: Tenderness with palpation of the right TM joint.   Lymphadenopathy:     Cervical: No cervical adenopathy.  Skin:     General: Skin is warm and dry.     Capillary Refill: Capillary refill takes less than 2 seconds.  Neurological:     General: No focal deficit present.     Mental Status: He is alert and oriented to person, place, and time.  Psychiatric:        Mood and Affect: Mood normal.        Behavior: Behavior normal.        Thought Content: Thought content normal.        Judgment: Judgment normal.   Today's Vitals   09/24/20 0816  BP: 128/83  Pulse: 81  Temp: 98.3 F (36.8 C)  SpO2: 93%  Weight: 226 lb 12.8 oz (102.9 kg)  Height: 5\' 10"  (1.778 m)   Body mass index is 32.54 kg/m.   Wt Readings from Last 3 Encounters:  09/24/20 226 lb 12.8 oz (102.9 kg)  09/18/20 225 lb 4.8 oz (102.2 kg)  09/10/20 225 lb 8 oz (102.3 kg)     Health Maintenance Due  Topic Date Due   HIV Screening  Never done   Hepatitis C Screening  Never done   TETANUS/TDAP  Never done   COVID-19 Vaccine (2 - Moderna series) 04/07/2020    There are no preventive care reminders to display for this patient.  Lab Results  Component Value Date   TSH 2.800 02/21/2018   Lab Results  Component Value Date   WBC 5.5 08/13/2019   HGB 17.6 (H) 08/13/2019   HCT 52.0 08/13/2019   MCV 91.1 08/13/2019   PLT 264 08/13/2019   Lab Results  Component Value Date   NA 140 08/13/2019   K 4.0 08/13/2019   CO2 24 08/13/2019   GLUCOSE 126 (H) 08/13/2019   BUN 13 08/13/2019   CREATININE 1.25 (H) 08/13/2019   BILITOT 1.0 08/13/2019   ALKPHOS 58 08/13/2019   AST 37 08/13/2019   ALT 61 (H) 08/13/2019   PROT 7.2 08/13/2019   ALBUMIN 4.4 08/13/2019   CALCIUM 9.6 08/13/2019   ANIONGAP 10 08/13/2019   Lab Results  Component Value Date  CHOL 246 (H) 02/21/2018   Lab Results  Component Value Date   HDL 33 (L) 02/21/2018   Lab Results  Component Value Date   LDLCALC 167 (H) 02/21/2018   Lab Results  Component Value Date   TRIG 229 (H) 02/21/2018   Lab Results  Component Value Date   CHOLHDL 7.5 (H) 02/21/2018    Lab Results  Component Value Date   HGBA1C 5.9 (H) 02/21/2018      Assessment & Plan:  1. Chest pain, unspecified type ECG done in office today showing normal sinus rhythm with nonspecific T wave abnormality. Will refer to cardiology for further evaluation  - Ambulatory referral to Cardiology - EKG 12-Lead  2. Arthralgia of right temporomandibular joint Trial diclofenac 75mg  daily to help reduce inflammation and pain in jaw and ear area. Try flexeril 10mg  at bedtime. Will have him start with 1/2 tablet and increase to whole tablet if needed and if tolerated.  - cyclobenzaprine (FLEXERIL) 10 MG tablet; Take 1/2 to 1 tablet po QHS prn  Dispense: 30 tablet; Refill: 1 - diclofenac (VOLTAREN) 75 MG EC tablet; Take 1 tablet (75 mg total) by mouth 2 (two) times daily between meals as needed.  Dispense: 60 tablet; Refill: 1  3. Mixed hyperlipidemia Fasting lipid panel obtained in the office today - CBC with Differential/Platelet - Comprehensive metabolic panel - Lipid panel - TSH  4. Healthcare maintenance Routine, fasting labs obtained in the office today - CBC with Differential/Platelet - Comprehensive metabolic panel - Lipid panel - TSH  5. Screening for prostate cancer PSA obtained in the office today.  - PSA  Problem List Items Addressed This Visit       Musculoskeletal and Integument   Arthralgia of right temporomandibular joint   Relevant Medications   cyclobenzaprine (FLEXERIL) 10 MG tablet   diclofenac (VOLTAREN) 75 MG EC tablet     Other   Mixed hyperlipidemia (Chronic)   Relevant Orders   CBC with Differential/Platelet   Comprehensive metabolic panel   Lipid panel   TSH   Chest pain - Primary   Relevant Orders   Ambulatory referral to Cardiology   EKG 12-Lead   Healthcare maintenance   Relevant Orders   CBC with Differential/Platelet   Comprehensive metabolic panel   Lipid panel   TSH   Screening for prostate cancer   Relevant Orders   PSA     Meds ordered this encounter  Medications   cyclobenzaprine (FLEXERIL) 10 MG tablet    Sig: Take 1/2 to 1 tablet po QHS prn    Dispense:  30 tablet    Refill:  1    Order Specific Question:   Supervising Provider    Answer:   [[id number]]   diclofenac (VOLTAREN) 75 MG EC tablet    Sig: Take 1 tablet (75 mg total) by mouth 2 (two) times daily between meals as needed.    Dispense:  60 tablet    Refill:  1    Patient currently has prescription. No new prescription sent 09/24/2020. May need to have refilled prior to next visit    Order Specific Question:   Supervising Provider    Answer:   Agapito Games D [[id number]]    Follow-up: Return in about 6 weeks (around 11/05/2020) for TMJ, review labs .    Nani Gasser, NP

## 2020-09-25 LAB — LIPID PANEL
Chol/HDL Ratio: 8.7 ratio — ABNORMAL HIGH (ref 0.0–5.0)
Cholesterol, Total: 261 mg/dL — ABNORMAL HIGH (ref 100–199)
HDL: 30 mg/dL — ABNORMAL LOW (ref >39)
LDL Chol Calc (NIH): 172 mg/dL — ABNORMAL HIGH (ref 0–99)
Triglycerides: 306 mg/dL — ABNORMAL HIGH (ref 0–149)
VLDL Cholesterol Cal: 59 mg/dL — ABNORMAL HIGH (ref 5–40)

## 2020-09-25 LAB — TSH: TSH: 2.1 u[IU]/mL (ref 0.450–4.500)

## 2020-09-25 LAB — COMPREHENSIVE METABOLIC PANEL
ALT: 62 IU/L — ABNORMAL HIGH (ref 0–44)
AST: 40 IU/L (ref 0–40)
Albumin/Globulin Ratio: 2.1 (ref 1.2–2.2)
Albumin: 4.6 g/dL (ref 3.8–4.9)
Alkaline Phosphatase: 81 IU/L (ref 44–121)
BUN/Creatinine Ratio: 11 (ref 9–20)
BUN: 12 mg/dL (ref 6–24)
Bilirubin Total: 0.5 mg/dL (ref 0.0–1.2)
CO2: 22 mmol/L (ref 20–29)
Calcium: 9.4 mg/dL (ref 8.7–10.2)
Chloride: 103 mmol/L (ref 96–106)
Creatinine, Ser: 1.06 mg/dL (ref 0.76–1.27)
Globulin, Total: 2.2 g/dL (ref 1.5–4.5)
Glucose: 115 mg/dL — ABNORMAL HIGH (ref 65–99)
Potassium: 4.7 mmol/L (ref 3.5–5.2)
Sodium: 139 mmol/L (ref 134–144)
Total Protein: 6.8 g/dL (ref 6.0–8.5)
eGFR: 82 mL/min/{1.73_m2} (ref >59)

## 2020-09-25 LAB — PSA: Prostate Specific Ag, Serum: 1.2 ng/mL (ref 0.0–4.0)

## 2020-09-25 LAB — CBC WITH DIFFERENTIAL/PLATELET
Basophils Absolute: 0.1 10*3/uL (ref 0.0–0.2)
Basos: 1 %
EOS (ABSOLUTE): 0.2 10*3/uL (ref 0.0–0.4)
Eos: 4 %
Hematocrit: 49.2 % (ref 37.5–51.0)
Hemoglobin: 17 g/dL (ref 13.0–17.7)
Immature Grans (Abs): 0.1 10*3/uL (ref 0.0–0.1)
Immature Granulocytes: 1 %
Lymphocytes Absolute: 1.4 10*3/uL (ref 0.7–3.1)
Lymphs: 25 %
MCH: 31.8 pg (ref 26.6–33.0)
MCHC: 34.6 g/dL (ref 31.5–35.7)
MCV: 92 fL (ref 79–97)
Monocytes Absolute: 0.5 10*3/uL (ref 0.1–0.9)
Monocytes: 8 %
Neutrophils Absolute: 3.6 10*3/uL (ref 1.4–7.0)
Neutrophils: 61 %
Platelets: 275 10*3/uL (ref 150–450)
RBC: 5.35 x10E6/uL (ref 4.14–5.80)
RDW: 12.8 % (ref 11.6–15.4)
WBC: 5.8 10*3/uL (ref 3.4–10.8)

## 2020-11-12 ENCOUNTER — Ambulatory Visit: Payer: Managed Care, Other (non HMO) | Admitting: Cardiology

## 2020-11-12 ENCOUNTER — Encounter: Payer: Self-pay | Admitting: Cardiology

## 2020-11-12 ENCOUNTER — Telehealth: Payer: Self-pay

## 2020-11-12 ENCOUNTER — Other Ambulatory Visit: Payer: Self-pay

## 2020-11-12 VITALS — BP 120/86 | HR 83 | Ht 70.0 in | Wt 227.0 lb

## 2020-11-12 DIAGNOSIS — R072 Precordial pain: Secondary | ICD-10-CM | POA: Diagnosis not present

## 2020-11-12 DIAGNOSIS — E782 Mixed hyperlipidemia: Secondary | ICD-10-CM | POA: Diagnosis not present

## 2020-11-12 DIAGNOSIS — R0602 Shortness of breath: Secondary | ICD-10-CM

## 2020-11-12 MED ORDER — ASPIRIN EC 81 MG PO TBEC: 81.0000 mg | DELAYED_RELEASE_TABLET | Freq: Every day | ORAL | 3 refills | Status: AC

## 2020-11-12 MED ORDER — METOPROLOL TARTRATE 100 MG PO TABS: 100.0000 mg | ORAL_TABLET | Freq: Once | ORAL | 0 refills | Status: DC

## 2020-11-12 MED ORDER — IVABRADINE HCL 5 MG PO TABS: ORAL_TABLET | ORAL | 0 refills | Status: DC

## 2020-11-12 MED ORDER — REPATHA SURECLICK 140 MG/ML ~~LOC~~ SOAJ: 1.0000 "pen " | SUBCUTANEOUS | 11 refills | Status: DC

## 2020-11-12 MED ORDER — NITROGLYCERIN 0.4 MG SL SUBL: 0.4000 mg | SUBLINGUAL_TABLET | SUBLINGUAL | 3 refills | Status: DC | PRN

## 2020-11-12 NOTE — Progress Notes (Signed)
Cardiology Office Note:    Date:  11/12/2020   ID:  Danny Ayers, DOB January 08, 1961, MRN [id number]  PCP:  Mayer Masker, PA-C   [facility] Providers Cardiologist:  None     Referring MD: Carlean Jews, NP   Chief Complaint  Patient presents with   New Patient (Initial Visit)    Referred by PCP for Chest pain, SOB, and swelling in ankles. Meds reviewed verbally with patient.   Danny Ayers is a 60 y.o. male who is being seen today for the evaluation of chest pain at the request of Carlean Jews, NP.   History of Present Illness:    Danny Ayers is a 61 y.o. male with a hx of hypertension, hyperlipidemia who presents due to chest pain.  States having symptoms of jaw pain ongoing over the past several years.  Seems to have worsened of late.  Over the past 1 to 2 years, he has occasional chest tightness which he rates as 8 out of 10 in severity associated with bilateral jaw pain.  Frequency and symptoms of increased of late.  Saw his primary care provider for scheduled visit, patient was subsequently referred.  He has a history of hypertension years ago, lost weight with improvement in blood pressure.  Blood pressure has been normal off BP meds.  He has high cholesterol levels, has tried niacin and statins in the past with significant adverse effects.  Also has shortness of breath, sometimes occurring without exertion.  States being able to take a deep breath but does not feel he is getting enough oxygen.  He endorsed snoring.  He is a [employer].  Past Medical History:  Diagnosis Date   Arthritis of low back    GERD (gastroesophageal reflux disease)    High cholesterol    History of kidney stones    Hypertension    diagnosed years ago, lost weight, no longer on medications    Past Surgical History:  Procedure Laterality Date   BICEPS TENDON REPAIR Bilateral    CYSTOSCOPY/URETEROSCOPY/HOLMIUM LASER/STENT PLACEMENT Right 02/06/2019   Procedure: CYSTOSCOPY; RIGHT  URETEROSCOPY; RIGHT URETERAL STENT PLACEMENT; BALLOON DILATION OF URETERAL STRICTURE; BASKET STONE EXTRACTION;  Surgeon: Bjorn Pippin, MD;  Location: [facility];  Service: Urology;  Laterality: Right;   LUMBAR LAMINECTOMY/DECOMPRESSION MICRODISCECTOMY Left 04/25/2019   Procedure: LEFT SIDED LUMBAR 4-5 MICRODISECTOMY;  Surgeon: Estill Bamberg, MD;  Location: [facility] OR;  Service: Orthopedics;  Laterality: Left;   NECK SURGERY     TRANSFORAMINAL LUMBAR INTERBODY FUSION (TLIF) WITH PEDICLE SCREW FIXATION 1 LEVEL Left 08/21/2019   Procedure: LEFT-SIDED LUMBAR 4- LUMBAR 5 TRANSFORAMINAL LUMBAR INTERBODY FUSION WITH INSTRUMENTATION AND ALLOGRAFT;  Surgeon: Estill Bamberg, MD;  Location: [facility] OR;  Service: Orthopedics;  Laterality: Left;   UPPER GASTROINTESTINAL ENDOSCOPY      Current Medications: Current Meds  Medication Sig   aspirin EC 81 MG tablet Take 1 tablet (81 mg total) by mouth daily. Swallow whole.   ivabradine (CORLANOR) 5 MG TABS tablet Take 2 tabs by mouth two hours prior to your CT scan.   omeprazole (PRILOSEC) 20 MG capsule Take 1 capsule (20 mg total) by mouth daily.   [DISCONTINUED] Evolocumab (REPATHA SURECLICK) 140 MG/ML SOAJ Inject 1 pen into the skin every 14 (fourteen) days.   [DISCONTINUED] metoprolol tartrate (LOPRESSOR) 100 MG tablet Take 1 tablet (100 mg total) by mouth once for 1 dose. Take 2 hours prior to your CT scan.   [DISCONTINUED] nitroGLYCERIN (NITROSTAT) 0.4 MG SL tablet Place 1  tablet (0.4 mg total) under the tongue every 5 (five) minutes as needed for chest pain. For a maximum of 3 tablets in one day.     Allergies:   Niacin and related, Statins, Tramadol, and Vicodin [hydrocodone-acetaminophen]   Social History   Socioeconomic History   Marital status: Married    Spouse name: Not on file   Number of children: Not on file   Years of education: Not on file   Highest education level: Not on file  Occupational History   Not on file  Tobacco Use   Smoking status: Never    Smokeless tobacco: Never  Vaping Use   Vaping Use: Never used  Substance and Sexual Activity   Alcohol use: Not Currently   Drug use: Never   Sexual activity: Yes    Partners: Female    Birth control/protection: None  Other Topics Concern   Not on file  Social History Narrative   Not on file   Social Determinants of Health   Financial Resource Strain: Not on file  Food Insecurity: Not on file  Transportation Needs: Not on file  Physical Activity: Not on file  Stress: Not on file  Social Connections: Not on file     Family History: The patient's family history includes Coronary artery disease in his father; Diabetes in his brother and paternal grandmother; Prostate cancer in his father and paternal uncle; Stroke in his paternal aunt.  ROS:   Please see the history of present illness.     All other systems reviewed and are negative.  EKGs/Labs/Other Studies Reviewed:    The following studies were reviewed today:   EKG:  EKG is  ordered today.  The ekg ordered today demonstrates normal sinus rhythm,  Recent Labs: 09/24/2020: ALT 62; BUN 12; Creatinine, Ser 1.06; Hemoglobin 17.0; Platelets 275; Potassium 4.7; Sodium 139; TSH 2.100  Recent Lipid Panel    Component Value Date/Time   CHOL 261 (H) 09/24/2020 0936   TRIG 306 (H) 09/24/2020 0936   HDL 30 (L) 09/24/2020 0936   CHOLHDL 8.7 (H) 09/24/2020 0936   LDLCALC 172 (H) 09/24/2020 0936     Risk Assessment/Calculations:          Physical Exam:    VS:  BP 120/86 (BP Location: Left Arm, Patient Position: Sitting, Cuff Size: Normal)   Pulse 83   Ht 5\' 10"  (1.778 m)   Wt 227 lb (103 kg)   SpO2 96%   BMI 32.57 kg/m     Wt Readings from Last 3 Encounters:  11/12/20 227 lb (103 kg)  09/24/20 226 lb 12.8 oz (102.9 kg)  09/18/20 225 lb 4.8 oz (102.2 kg)     GEN:  Well nourished, well developed in no acute distress HEENT: Normal NECK: No JVD; No carotid bruits LYMPHATICS: No lymphadenopathy CARDIAC: RRR,  no murmurs, rubs, gallops RESPIRATORY:  Clear to auscultation without rales, wheezing or rhonchi  ABDOMEN: Soft, non-tender, non-distended MUSCULOSKELETAL:  No edema; No deformity  SKIN: Warm and dry NEUROLOGIC:  Alert and oriented x 3 PSYCHIATRIC:  Normal affect   ASSESSMENT:    1. Precordial pain   2. Mixed hyperlipidemia   3. SOB (shortness of breath)    PLAN:    In order of problems listed above:  Chest pain, risk factors hyperlipidemia, previous history of hypertension.  Get echocardiogram to evaluate systolic and diastolic function.  Get coronary CTA to evaluate presence of CAD.  Start aspirin 81 mg daily.  Will prescribe sublingual nitro  as needed chest pain. Hyperlipidemia, intolerant to statins.  Start PCSK9.  Plan to repeat fasting lipid profile in 3 months.  May consider Nexlizet at if cholesterol not well controlled. Shortness of breath, snoring.  Pulm referral recommended for OSA eval, PFT testing.  Patient wants to undergo cardiac testing before considering pulmonary eval.   Follow-up after echo and coronary CTA.     Medication Adjustments/Labs and Tests Ordered: Current medicines are reviewed at length with the patient today.  Concerns regarding medicines are outlined above.  Orders Placed This Encounter  Procedures   CT CORONARY MORPH W/CTA COR W/SCORE W/CA W/CM &/OR WO/CM   Basic metabolic panel   EKG 12-Lead   ECHOCARDIOGRAM COMPLETE    Meds ordered this encounter  Medications   aspirin EC 81 MG tablet    Sig: Take 1 tablet (81 mg total) by mouth daily. Swallow whole.    Dispense:  90 tablet    Refill:  3   DISCONTD: nitroGLYCERIN (NITROSTAT) 0.4 MG SL tablet    Sig: Place 1 tablet (0.4 mg total) under the tongue every 5 (five) minutes as needed for chest pain. For a maximum of 3 tablets in one day.    Dispense:  90 tablet    Refill:  3   DISCONTD: Evolocumab (REPATHA SURECLICK) 140 MG/ML SOAJ    Sig: Inject 1 pen into the skin every 14 (fourteen)  days.    Dispense:  2 mL    Refill:  11   ivabradine (CORLANOR) 5 MG TABS tablet    Sig: Take 2 tabs by mouth two hours prior to your CT scan.    Dispense:  2 tablet    Refill:  0   DISCONTD: metoprolol tartrate (LOPRESSOR) 100 MG tablet    Sig: Take 1 tablet (100 mg total) by mouth once for 1 dose. Take 2 hours prior to your CT scan.    Dispense:  1 tablet    Refill:  0   Evolocumab (REPATHA SURECLICK) 140 MG/ML SOAJ    Sig: Inject 1 pen into the skin every 14 (fourteen) days.    Dispense:  2 mL    Refill:  11   metoprolol tartrate (LOPRESSOR) 100 MG tablet    Sig: Take 1 tablet (100 mg total) by mouth once for 1 dose. Take 2 hours prior to your CT scan.    Dispense:  1 tablet    Refill:  0   nitroGLYCERIN (NITROSTAT) 0.4 MG SL tablet    Sig: Place 1 tablet (0.4 mg total) under the tongue every 5 (five) minutes as needed for chest pain. For a maximum of 3 tablets in one day.    Dispense:  90 tablet    Refill:  3     Patient Instructions  Medication Instructions:   Your physician has recommended you make the following change in your medication:    START taking Aspirin 81 MG once a day.  2.    START taking Nitrogycerin 0.4 MG: Place one tab under tongue every 5 minutes as needed for chest pain. For a maximum of 3 doses.  3.    START Repatha 140 MG: inject into the skin once every 14 days.    *If you need a refill on your cardiac medications before your next appointment, please call your pharmacy*   Lab Work:  BMP drawn today.  If you have labs (blood work) drawn today and your tests are completely normal, you will receive your results only by:  MyChart Message (if you have MyChart) OR A paper copy in the mail If you have any lab test that is abnormal or we need to change your treatment, we will call you to review the results.   Testing/Procedures:   Your physician has requested that you have an echocardiogram. Echocardiography is a painless test that uses sound  waves to create images of your heart. It provides your doctor with information about the size and shape of your heart and how well your heart's chambers and valves are working. This procedure takes approximately one hour. There are no restrictions for this procedure.  2.   Your physician has requested that you have cardiac CT. Cardiac computed tomography (CT) is a painless test that uses an x-ray machine to take clear, detailed pictures of your heart.     Your cardiac CT will be scheduled at :  [facility] [address] [place], [place] [address] [phone]  Please arrive 15 mins early for check-in and test prep.     Please follow these instructions carefully (unless otherwise directed):  Hold all erectile dysfunction medications at least 3 days (72 hrs) prior to test.    On the Night Before the Test: Be sure to Drink plenty of water. Do not consume any caffeinated/decaffeinated beverages or chocolate 12 hours prior to your test.     On the Day of the Test: Drink plenty of water until 1 hour prior to the test. Do not eat any food 4 hours prior to the test. You may take your regular medications prior to the test.  Take metoprolol (Lopressor) two hours prior to test. Take Ivabradine (Corlanor) 10 MG (2tabs) two hours prior to your test.         After the Test: Drink plenty of water. After receiving IV contrast, you may experience a mild flushed feeling. This is normal. On occasion, you may experience a mild rash up to 24 hours after the test. This is not dangerous. If this occurs, you can take Benadryl 25 mg and increase your fluid intake. If you experience trouble breathing, this can be serious. If it is severe call 911 IMMEDIATELY. If it is mild, please call our office. If you take any of these medications: Glipizide/Metformin, Avandament, Glucavance, please do not take 48 hours after completing test unless otherwise  instructed.   Please allow 2-4 weeks for scheduling of routine cardiac CTs. Some insurance companies require a pre-authorization which may delay scheduling of this test.   For non-scheduling related questions, please contact the cardiac imaging nurse navigator should you have any questions/concerns: Rockwell Alexandria, Cardiac Imaging Nurse Navigator Larey Brick, Cardiac Imaging Nurse Navigator [facility] Direct Office Dial: [phone]   For scheduling needs, including cancellations and rescheduling, please call [place], [phone].    Follow-Up: At [facility], you and your health needs are our priority.  As part of our continuing mission to provide you with exceptional heart care, we have created designated Provider Care Teams.  These Care Teams include your primary Cardiologist (physician) and Advanced Practice Providers (APPs -  Physician Assistants and Nurse Practitioners) who all work together to provide you with the care you need, when you need it.  We recommend signing up for the patient portal called "MyChart".  Sign up information is provided on this After Visit Summary.  MyChart is used to connect with patients for Virtual Visits (Telemedicine).  Patients are able to view lab/test results, encounter  notes, upcoming appointments, etc.  Non-urgent messages can be sent to your provider as well.   To learn more about what you can do with MyChart, go to [email or web address].    Your next appointment:   Follow up after Echo and CCTA ( which will be on 11/19/20)  The format for your next appointment:   In Person  Provider:   Debbe Odea, MD   Other Instructions     Signed, Debbe Odea, MD  11/12/2020 1:19 PM    [facility]

## 2020-11-12 NOTE — Telephone Encounter (Signed)
PA started through [employer] (Key: [id number]) Rx #: [id number] Repatha SureClick 140MG /ML auto-injectors  Your information has been submitted and will be reviewed by [employer]. You may close this dialog, return to your dashboard, and perform other tasks. An electronic determination will be received in CoverMyMeds within 72-120 hours. You can see the latest determination by locating this request on your dashboard or by reopening this request. You will receive a fax copy of the determination. If has not responded in 120 hours, contact [employer] at [phone].

## 2020-11-12 NOTE — Patient Instructions (Signed)
Medication Instructions:   Your physician has recommended you make the following change in your medication:    START taking Aspirin 81 MG once a day.  2.    START taking Nitrogycerin 0.4 MG: Place one tab under tongue every 5 minutes as needed for chest pain. For a maximum of 3 doses.  3.    START Repatha 140 MG: inject into the skin once every 14 days.    *If you need a refill on your cardiac medications before your next appointment, please call your pharmacy*   Lab Work:  BMP drawn today.  If you have labs (blood work) drawn today and your tests are completely normal, you will receive your results only by: MyChart Message (if you have MyChart) OR A paper copy in the mail If you have any lab test that is abnormal or we need to change your treatment, we will call you to review the results.   Testing/Procedures:   Your physician has requested that you have an echocardiogram. Echocardiography is a painless test that uses sound waves to create images of your heart. It provides your doctor with information about the size and shape of your heart and how well your heart's chambers and valves are working. This procedure takes approximately one hour. There are no restrictions for this procedure.  2.   Your physician has requested that you have cardiac CT. Cardiac computed tomography (CT) is a painless test that uses an x-ray machine to take clear, detailed pictures of your heart.     Your cardiac CT will be scheduled at :  [facility] [address] [place], [place] [address] [phone]  Please arrive 15 mins early for check-in and test prep.     Please follow these instructions carefully (unless otherwise directed):  Hold all erectile dysfunction medications at least 3 days (72 hrs) prior to test.    On the Night Before the Test: Be sure to Drink plenty of water. Do not consume any caffeinated/decaffeinated beverages or  chocolate 12 hours prior to your test.     On the Day of the Test: Drink plenty of water until 1 hour prior to the test. Do not eat any food 4 hours prior to the test. You may take your regular medications prior to the test.  Take metoprolol (Lopressor) two hours prior to test. Take Ivabradine (Corlanor) 10 MG (2tabs) two hours prior to your test.         After the Test: Drink plenty of water. After receiving IV contrast, you may experience a mild flushed feeling. This is normal. On occasion, you may experience a mild rash up to 24 hours after the test. This is not dangerous. If this occurs, you can take Benadryl 25 mg and increase your fluid intake. If you experience trouble breathing, this can be serious. If it is severe call 911 IMMEDIATELY. If it is mild, please call our office. If you take any of these medications: Glipizide/Metformin, Avandament, Glucavance, please do not take 48 hours after completing test unless otherwise instructed.   Please allow 2-4 weeks for scheduling of routine cardiac CTs. Some insurance companies require a pre-authorization which may delay scheduling of this test.   For non-scheduling related questions, please contact the cardiac imaging nurse navigator should you have any questions/concerns: Rockwell Alexandria, Cardiac Imaging Nurse Navigator Larey Brick, Cardiac Imaging Nurse Navigator [place] [facility] Direct Office Dial: [phone]   For scheduling needs, including cancellations  and rescheduling, please call [place], [phone].    Follow-Up: At [facility], you and your health needs are our priority.  As part of our continuing mission to provide you with exceptional heart care, we have created designated Provider Care Teams.  These Care Teams include your primary Cardiologist (physician) and Advanced Practice Providers (APPs -  Physician Assistants and Nurse Practitioners) who all work together to provide you with the  care you need, when you need it.  We recommend signing up for the patient portal called "MyChart".  Sign up information is provided on this After Visit Summary.  MyChart is used to connect with patients for Virtual Visits (Telemedicine).  Patients are able to view lab/test results, encounter notes, upcoming appointments, etc.  Non-urgent messages can be sent to your provider as well.   To learn more about what you can do with MyChart, go to [email or web address].    Your next appointment:   Follow up after Echo and CCTA ( which will be on 11/19/20)  The format for your next appointment:   In Person  Provider:   Debbe Odea, MD   Other Instructions

## 2020-11-13 LAB — BASIC METABOLIC PANEL
BUN/Creatinine Ratio: 11 (ref 9–20)
BUN: 12 mg/dL (ref 6–24)
CO2: 18 mmol/L — ABNORMAL LOW (ref 20–29)
Calcium: 9.6 mg/dL (ref 8.7–10.2)
Chloride: 104 mmol/L (ref 96–106)
Creatinine, Ser: 1.07 mg/dL (ref 0.76–1.27)
Glucose: 117 mg/dL — ABNORMAL HIGH (ref 65–99)
Potassium: 5.1 mmol/L (ref 3.5–5.2)
Sodium: 139 mmol/L (ref 134–144)
eGFR: 81 mL/min/{1.73_m2} (ref >59)

## 2020-11-17 ENCOUNTER — Telehealth (HOSPITAL_COMMUNITY): Payer: Self-pay | Admitting: Emergency Medicine

## 2020-11-17 NOTE — Telephone Encounter (Signed)
Attempted to call patient regarding upcoming cardiac CT appointment. °Left message on voicemail with name and callback number °Caprice Wasko RN Navigator Cardiac Imaging °[place] [facility] °[phone] Office °[phone] Cell ° °

## 2020-11-17 NOTE — Telephone Encounter (Signed)
Reaching out to patient to offer assistance regarding upcoming cardiac imaging study; pt verbalizes understanding of appt date/time, parking situation and where to check in, pre-test NPO status and medications ordered, and verified current allergies; name and call back number provided for further questions should they arise Rockwell Alexandria RN Navigator Cardiac Imaging [facility] [phone] office [phone] cell  Denies IV issues Some claustro 100mg  metoprolol tart  10mg  ivabradine

## 2020-11-18 NOTE — Telephone Encounter (Signed)
PA for Repatha sureclick 140 mg/ml PEN INJCTR  denied. Please review the letter received regarding the denial process.

## 2020-11-19 ENCOUNTER — Ambulatory Visit: Admission: RE | Admit: 2020-11-19 | Payer: Managed Care, Other (non HMO) | Source: Ambulatory Visit

## 2020-11-20 ENCOUNTER — Telehealth: Payer: Self-pay

## 2020-11-20 NOTE — Telephone Encounter (Signed)
Danny Ayers (Key: [id number]) pa submitted for repatha as requested by Laural Golden pharmacist.

## 2020-11-20 NOTE — Telephone Encounter (Signed)
Called patient and left a detailed VM per DPR on file informing patient that we are still working on his Repatha approval. I also informed him of the following message:  Per Micael Hampshire at [facility] regarding CCTA scan. I went to our Registration girls and they pulled up his Account and it stated [employer] actually did Authorize it and was going to pay $1,350.00 and what he was going to owe was $0. Zero. I called him twice and both times it went to voicemail. I left him two details messages about this and told him to call me and i would be glad to reschedule him. As of yet i have not gotten a call from him.   Encouraged patient to call back with any questions or concerns.

## 2020-11-24 ENCOUNTER — Telehealth (HOSPITAL_COMMUNITY): Payer: Self-pay | Admitting: *Deleted

## 2020-11-24 NOTE — Telephone Encounter (Signed)
Patient has been scheduled for 11/26/20.  Closing encounter.

## 2020-11-24 NOTE — Telephone Encounter (Signed)
Reaching out to patient to offer assistance regarding upcoming cardiac imaging study; pt verbalizes understanding of appt date/time, parking situation and where to check in, pre-test NPO status and medications ordered, and verified current allergies; name and call back number provided for further questions should they arise ° °Laketra Bowdish RN Navigator Cardiac Imaging ° Heart and Vascular °[phone] office °[phone] cell ° °Patient to take 100mg metoprolol tartrate and 10mg ivabradine two hours prior to cardiac CT scan. °

## 2020-11-26 ENCOUNTER — Ambulatory Visit
Admission: RE | Admit: 2020-11-26 | Discharge: 2020-11-26 | Disposition: A | Discharge status: Home / Self Care | Payer: Managed Care, Other (non HMO) | Source: Ambulatory Visit | Attending: Cardiology | Admitting: Cardiology

## 2020-11-26 ENCOUNTER — Other Ambulatory Visit: Payer: Self-pay

## 2020-11-26 DIAGNOSIS — R931 Abnormal findings on diagnostic imaging of heart and coronary circulation: Secondary | ICD-10-CM | POA: Insufficient documentation

## 2020-11-26 DIAGNOSIS — I2510 Atherosclerotic heart disease of native coronary artery without angina pectoris: Secondary | ICD-10-CM

## 2020-11-26 DIAGNOSIS — R072 Precordial pain: Secondary | ICD-10-CM | POA: Insufficient documentation

## 2020-11-26 MED ORDER — METOPROLOL TARTRATE 5 MG/5ML IV SOLN
5.0000 mg | Freq: Once | INTRAVENOUS | Status: AC
  Administered 2020-11-26: 5 mg via INTRAVENOUS

## 2020-11-26 MED ORDER — NITROGLYCERIN 0.4 MG SL SUBL
0.8000 mg | SUBLINGUAL_TABLET | Freq: Once | SUBLINGUAL | Status: AC
  Administered 2020-11-26: 0.8 mg via SUBLINGUAL

## 2020-11-26 MED ORDER — IOHEXOL 350 MG/ML SOLN
100.0000 mL | Freq: Once | INTRAVENOUS | Status: AC | PRN
  Administered 2020-11-26: 100 mL via INTRAVENOUS

## 2020-11-26 NOTE — Progress Notes (Signed)
Patient tolerated CT well. Drank water after. Vital signs stable encourage to drink water throughout day.Reasons explained and verbalized understanding. Ambulated steady gait.  

## 2020-11-26 NOTE — Telephone Encounter (Signed)
PA approved until 11/26/21.  Recommend plcing lab order and activating copay card

## 2020-11-26 NOTE — Telephone Encounter (Signed)
I need permission from a pharmd on what labs to order post 4th dose. Routing to the [employer].  Once they advise on labs, I will call the pt.  REPATHA HAS BEEN APPROVED TIL 11/26/21.

## 2020-11-27 ENCOUNTER — Other Ambulatory Visit: Payer: Self-pay | Admitting: Cardiology

## 2020-11-27 ENCOUNTER — Telehealth: Payer: Self-pay

## 2020-11-27 DIAGNOSIS — I2510 Atherosclerotic heart disease of native coronary artery without angina pectoris: Secondary | ICD-10-CM | POA: Diagnosis not present

## 2020-11-27 DIAGNOSIS — R931 Abnormal findings on diagnostic imaging of heart and coronary circulation: Secondary | ICD-10-CM

## 2020-11-27 NOTE — Telephone Encounter (Signed)
Spoke with patient and informed him of his CCTA result note. I offered patient an appointment on Monday 11/30/20 at 1100. Patient stated that he had to talk to his boss and see if he could get the time off to take the appointment. He stated that he would call back and let us know.

## 2020-11-27 NOTE — Telephone Encounter (Signed)
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  Schedule patient for follow-up, may need a left heart cath.

## 2020-11-27 NOTE — Telephone Encounter (Signed)
Patient called back and spoke with Mignon Pine. He is scheduled for 11/30/20 at 1100.

## 2020-11-30 ENCOUNTER — Other Ambulatory Visit
Admission: RE | Admit: 2020-11-30 | Discharge: 2020-11-30 | Disposition: A | Discharge status: Home / Self Care | Payer: Managed Care, Other (non HMO) | Source: Ambulatory Visit | Attending: Cardiology | Admitting: Cardiology

## 2020-11-30 ENCOUNTER — Other Ambulatory Visit: Payer: Self-pay

## 2020-11-30 ENCOUNTER — Encounter: Payer: Self-pay | Admitting: Cardiology

## 2020-11-30 ENCOUNTER — Ambulatory Visit: Payer: Managed Care, Other (non HMO) | Admitting: Cardiology

## 2020-11-30 VITALS — BP 138/90 | HR 78 | Ht 70.0 in | Wt 221.4 lb

## 2020-11-30 DIAGNOSIS — E782 Mixed hyperlipidemia: Secondary | ICD-10-CM | POA: Diagnosis not present

## 2020-11-30 DIAGNOSIS — I2510 Atherosclerotic heart disease of native coronary artery without angina pectoris: Secondary | ICD-10-CM

## 2020-11-30 LAB — BASIC METABOLIC PANEL
Anion gap: 9 (ref 5–15)
BUN: 12 mg/dL (ref 6–20)
CO2: 21 mmol/L — ABNORMAL LOW (ref 22–32)
Calcium: 9.1 mg/dL (ref 8.9–10.3)
Chloride: 106 mmol/L (ref 98–111)
Creatinine, Ser: 1.17 mg/dL (ref 0.61–1.24)
GFR, Estimated: >60 mL/min (ref >60)
Glucose, Bld: 152 mg/dL — ABNORMAL HIGH (ref 70–99)
Potassium: 4.3 mmol/L (ref 3.5–5.1)
Sodium: 136 mmol/L (ref 135–145)

## 2020-11-30 LAB — CBC
HCT: 48 % (ref 39.0–52.0)
Hemoglobin: 17.4 g/dL — ABNORMAL HIGH (ref 13.0–17.0)
MCH: 32.6 pg (ref 26.0–34.0)
MCHC: 36.3 g/dL — ABNORMAL HIGH (ref 30.0–36.0)
MCV: 89.9 fL (ref 80.0–100.0)
Platelets: 258 10*3/uL (ref 150–400)
RBC: 5.34 MIL/uL (ref 4.22–5.81)
RDW: 11.9 % (ref 11.5–15.5)
WBC: 7.1 10*3/uL (ref 4.0–10.5)
nRBC: 0 % (ref 0.0–0.2)

## 2020-11-30 MED ORDER — PRAVASTATIN SODIUM 20 MG PO TABS: 20.0000 mg | ORAL_TABLET | Freq: Every evening | ORAL | 3 refills | Status: DC

## 2020-11-30 NOTE — Patient Instructions (Addendum)
Medication Instructions:   Your physician has recommended you make the following change in your medication:    START taking Pravachol 20 MG once a day in the evening.  *If you need a refill on your cardiac medications before your next appointment, please call your pharmacy*   Lab Work:  Please go to the Medical Mall after this appointment to have lab work drawn (CBC, BMP)   Testing/Procedures:   [facility]ARDI[facility] [place] [address] [place] [address] Dept: [phone] Loc: [phone]  Danny Ayers  11/30/2020  You are scheduled for a Cardiac Catheterization on Monday, September 12 with Dr. Lorine Bears.  1. Please arrive at the Medical Mall at 8:30 AM (This time is one hours before your procedure to ensure your preparation). Free valet parking service is available.   Special note: Every effort is made to have your procedure done on time. Please understand that emergencies sometimes delay scheduled procedures.  2. Diet: Do not eat solid foods after midnight.  The patient may have clear liquids until 5am upon the day of the procedure.  3. Labs:  Drawn the day of your Office Visit  4. Medication instructions in preparation for your procedure:   Contrast Allergy: No   *For reference purposes while preparing patient instructions.   Delete this med list prior to printing instructions for patient.*    On the morning of your procedure, take your Aspirin and any morning medicines NOT listed above.  You may use sips of water.  5. Plan for one night stay--bring personal belongings. 6. Bring a current list of your medications and current insurance cards. 7. You MUST have a responsible person to drive you home. 8. Someone MUST be with you the first 24 hours after you arrive home or your discharge will be delayed. 9. Please wear clothes that are easy to get on and off and wear slip-on  shoes.  Thank you for allowing [place] to care for you!   -- [place] [facility]    Follow-Up: At [facility], you and your health needs are our priority.  As part of our continuing mission to provide you with exceptional heart care, we have created designated Provider Care Teams.  These Care Teams include your primary Cardiologist (physician) and Advanced Practice Providers (APPs -  Physician Assistants and Nurse Practitioners) who all work together to provide you with the care you need, when you need it.  We recommend signing up for the patient portal called "MyChart".  Sign up information is provided on this After Visit Summary.  MyChart is used to connect with patients for Virtual Visits (Telemedicine).  Patients are able to view lab/test results, encounter notes, upcoming appointments, etc.  Non-urgent messages can be sent to your provider as well.   To learn more about what you can do with MyChart, go to [email or web address].    Your next appointment:   1 month(s)  The format for your next appointment:   In Person  Provider:   Debbe Odea, MD   Other Instructions

## 2020-11-30 NOTE — Progress Notes (Signed)
Cardiology Office Note:    Date:  11/30/2020   ID:  Parthiv Service, DOB 07/23/1960, MRN [id number]  PCP:  Abonza, Maritza, PA-C   [facility] Providers Cardiologist:  None     Referring MD: Abonza, Maritza, PA-C   Chief Complaint  Patient presents with   Other    F/u results Discuss Procedure. Meds reveiwed verbally with pt.   History of Present Illness:    Danny Ayers is a 59 y.o. male with a hx of hypertension, hyperlipidemia who presents for follow-up.  He was last seen due to chest pain.  Coronary CTA was performed to evaluate presence of CAD.  Results reviewed by myself showing significant stenosis in the proximal to mid LAD.  Patient advised to follow-up for left heart cath consideration.  Echocardiogram was ordered, is pending.  Also referred to pulmonary medicine for OSA eval due to snoring.  He states doing okay, has not used nitroglycerin for chest pain.  Planning on going on vacation next week.  Prior notes/tests Coronary CTA 11/26/2020 calcium score 100, significant proximal to mid LAD stenosis  Past Medical History:  Diagnosis Date   Arthritis of low back    GERD (gastroesophageal reflux disease)    High cholesterol    History of kidney stones    Hypertension    diagnosed years ago, lost weight, no longer on medications    Past Surgical History:  Procedure Laterality Date   BICEPS TENDON REPAIR Bilateral    CYSTOSCOPY/URETEROSCOPY/HOLMIUM LASER/STENT PLACEMENT Right 02/06/2019   Procedure: CYSTOSCOPY; RIGHT URETEROSCOPY; RIGHT URETERAL STENT PLACEMENT; BALLOON DILATION OF URETERAL STRICTURE; BASKET STONE EXTRACTION;  Surgeon: Wrenn, John, MD;  Location: [facility];  Service: Urology;  Laterality: Right;   LUMBAR LAMINECTOMY/DECOMPRESSION MICRODISCECTOMY Left 04/25/2019   Procedure: LEFT SIDED LUMBAR 4-5 MICRODISECTOMY;  Surgeon: Dumonski, Mark, MD;  Location: [facility];  Service: Orthopedics;  Laterality: Left;   NECK SURGERY     TRANSFORAMINAL LUMBAR INTERBODY FUSION  (TLIF) WITH PEDICLE SCREW FIXATION 1 LEVEL Left 08/21/2019   Procedure: LEFT-SIDED LUMBAR 4- LUMBAR 5 TRANSFORAMINAL LUMBAR INTERBODY FUSION WITH INSTRUMENTATION AND ALLOGRAFT;  Surgeon: Dumonski, Mark, MD;  Location: [facility];  Service: Orthopedics;  Laterality: Left;   UPPER GASTROINTESTINAL ENDOSCOPY      Current Medications: Current Meds  Medication Sig   aspirin EC 81 MG tablet Take 1 tablet (81 mg total) by mouth daily. Swallow whole.   nitroGLYCERIN (NITROSTAT) 0.4 MG SL tablet Place 1 tablet (0.4 mg total) under the tongue every 5 (five) minutes as needed for chest pain. For a maximum of 3 tablets in one day.   omeprazole (PRILOSEC) 20 MG capsule Take 1 capsule (20 mg total) by mouth daily.   pravastatin (PRAVACHOL) 20 MG tablet Take 1 tablet (20 mg total) by mouth every evening.     Allergies:   Niacin and related, Statins, Tramadol, and Vicodin [hydrocodone-acetaminophen]   Social History   Socioeconomic History   Marital status: Married    Spouse name: Not on file   Number of children: Not on file   Years of education: Not on file   Highest education level: Not on file  Occupational History   Not on file  Tobacco Use   Smoking status: Never   Smokeless tobacco: Never  Vaping Use   Vaping Use: Never used  Substance and Sexual Activity   Alcohol use: Not Currently   Drug use: Never   Sexual activity: Yes    Partners: Female    Birth control/protection: None    Other Topics Concern   Not on file  Social History Narrative   Not on file   Social Determinants of Health   Financial Resource Strain: Not on file  Food Insecurity: Not on file  Transportation Needs: Not on file  Physical Activity: Not on file  Stress: Not on file  Social Connections: Not on file     Family History: The patient's family history includes Coronary artery disease in his father; Diabetes in his brother and paternal grandmother; Prostate cancer in his father and paternal uncle; Stroke in his  paternal aunt.  ROS:   Please see the history of present illness.     All other systems reviewed and are negative.  EKGs/Labs/Other Studies Reviewed:    The following studies were reviewed today:   EKG:  EKG is  ordered today.  The ekg ordered today demonstrates normal sinus rhythm,  Recent Labs: 09/24/2020: ALT 62; TSH 2.100 11/12/2020: BUN 12; Creatinine, Ser 1.07; Potassium 5.1; Sodium 139 11/30/2020: Hemoglobin 17.4; Platelets 258  Recent Lipid Panel    Component Value Date/Time   CHOL 261 (H) 09/24/2020 0936   TRIG 306 (H) 09/24/2020 0936   HDL 30 (L) 09/24/2020 0936   CHOLHDL 8.7 (H) 09/24/2020 0936   LDLCALC 172 (H) 09/24/2020 0936     Risk Assessment/Calculations:          Physical Exam:    VS:  BP 138/90 (BP Location: Left Arm, Patient Position: Sitting, Cuff Size: Normal)   Pulse 78   Ht 5\' 10"  (1.778 m)   Wt 221 lb 6 oz (100.4 kg)   SpO2 97%   BMI 31.76 kg/m     Wt Readings from Last 3 Encounters:  11/30/20 221 lb 6 oz (100.4 kg)  11/12/20 227 lb (103 kg)  09/24/20 226 lb 12.8 oz (102.9 kg)     GEN:  Well nourished, well developed in no acute distress HEENT: Normal NECK: No JVD; No carotid bruits LYMPHATICS: No lymphadenopathy CARDIAC: RRR, no murmurs, rubs, gallops RESPIRATORY:  Clear to auscultation without rales, wheezing or rhonchi  ABDOMEN: Soft, non-tender, non-distended MUSCULOSKELETAL:  No edema; No deformity  SKIN: Warm and dry NEUROLOGIC:  Alert and oriented x 3 PSYCHIATRIC:  Normal affect   ASSESSMENT:    1. Coronary artery disease involving native coronary artery of native heart, unspecified whether angina present   2. Mixed hyperlipidemia     PLAN:    In order of problems listed above:  Chest pain, risk factors hyperlipidemia, previous history of hypertension.  Coronary CTA with significant stenosis in the proximal to mid LAD. echocardiogram is pending.  Continue aspirin, PCSK9.  Sublingual nitro.  Schedule left heart cath.   Obtain echo as scheduled.  He plans to go on vacation next week, encourage patient to obtain heart cath ASAP.  He states he is willing to take the chance. Hyperlipidemia, intolerant to statins.  Repatha as prescribed.  If not improved, plan to try Pravachol and titrate dose.  Previously intolerant to statins.   Follow-up in 1 month   Shared Decision Making/Informed Consent The risks [stroke (1 in 1000), death (1 in 1000), kidney failure [usually temporary] (1 in 500), bleeding (1 in 200), allergic reaction [possibly serious] (1 in 200)], benefits (diagnostic support and management of coronary artery disease) and alternatives of a cardiac catheterization were discussed in detail with Danny Ayers and he is willing to proceed.   Medication Adjustments/Labs and Tests Ordered: Current medicines are reviewed at length with the patient today.  Concerns regarding medicines are outlined above.  Orders Placed This Encounter  Procedures   CBC   Basic metabolic panel   EKG 12-Lead     Meds ordered this encounter  Medications   pravastatin (PRAVACHOL) 20 MG tablet    Sig: Take 1 tablet (20 mg total) by mouth every evening.    Dispense:  30 tablet    Refill:  3      Patient Instructions  Medication Instructions:   Your physician has recommended you make the following change in your medication:    START taking Pravachol 20 MG once a day in the evening.  *If you need a refill on your cardiac medications before your next appointment, please call your pharmacy*   Lab Work:  Please go to the Medical Mall after this appointment to have lab work drawn (CBC, BMP)   Testing/Procedures:   [facility]ARDI[facility]ASCULAR [facility] [place] [address] [place] [address] Dept: [phone] Loc: [phone]  Danny Ayers  11/30/2020  You are scheduled for a Cardiac Catheterization on Monday, September 12 with Dr. Lorine Bears.  1. Please arrive at the Medical Mall at 8:30 AM (This time is one hours before your procedure to ensure your preparation). Free valet parking service is available.   Special note: Every effort is made to have your procedure done on time. Please understand that emergencies sometimes delay scheduled procedures.  2. Diet: Do not eat solid foods after midnight.  The patient may have clear liquids until 5am upon the day of the procedure.  3. Labs:  Drawn the day of your Office Visit  4. Medication instructions in preparation for your procedure:   Contrast Allergy: No   *For reference purposes while preparing patient instructions.   Delete this med list prior to printing instructions for patient.*    On the morning of your procedure, take your Aspirin and any morning medicines NOT listed above.  You may use sips of water.  5. Plan for one night stay--bring personal belongings. 6. Bring a current list of your medications and current insurance cards. 7. You MUST have a responsible person to drive you home. 8. Someone MUST be with you the first 24 hours after you arrive home or your discharge will be delayed. 9. Please wear clothes that are easy to get on and off and wear slip-on shoes.  Thank you for allowing [place] to care for you!   -- [place] [facility]    Follow-Up: At [facility], you and your health needs are our priority.  As part of our continuing mission to provide you with exceptional heart care, we have created designated Provider Care Teams.  These Care Teams include your primary Cardiologist (physician) and Advanced Practice Providers (APPs -  Physician Assistants and Nurse Practitioners) who all work together to provide you with the care you need, when you need it.  We recommend signing up for the patient portal called "MyChart".  Sign up information is provided on this After Visit Summary.  MyChart is used to connect with patients for  Virtual Visits (Telemedicine).  Patients are able to view lab/test results, encounter notes, upcoming appointments, etc.  Non-urgent messages can be sent to your provider as well.   To learn more about what you can do with MyChart, go to [email or web address].    Your next appointment:   1 month(s)  The format for your next appointment:   In Person  Provider:   Debbe Odea, MD   Other Instructions    Signed, Debbe Odea, MD  11/30/2020 12:32 PM    [facility]

## 2020-11-30 NOTE — Telephone Encounter (Signed)
Patient was seen in office today. Patient stated that he did not want to try Repatha yet. He wanted to try Pravachol first, as discussed in clinic today with Dr. Azucena Cecil.

## 2020-11-30 NOTE — H&P (View-Only) (Signed)
Cardiology Office Note:    Date:  11/30/2020   ID:  Danny MussSteven Ayers, DOB 04/06/1960, MRN [id number]  PCP:  Mayer MaskerAbonza, Maritza, PA-C   [facility] Cardiologist:  None     Referring MD: Mayer MaskerAbonza, Maritza, PA-C   Chief Complaint  Patient presents with   Other    F/u results Discuss Procedure. Meds reveiwed verbally with pt.   History of Present Illness:    Danny Ayers is a 60 y.o. male with a hx of hypertension, hyperlipidemia who presents for follow-up.  He was last seen due to chest pain.  Coronary CTA was performed to evaluate presence of CAD.  Results reviewed by myself showing significant stenosis in the proximal to mid LAD.  Patient advised to follow-up for left heart cath consideration.  Echocardiogram was ordered, is pending.  Also referred to pulmonary medicine for OSA eval due to snoring.  He states doing okay, has not used nitroglycerin for chest pain.  Planning on going on vacation next week.  Prior notes/tests Coronary CTA 11/26/2020 calcium score 100, significant proximal to mid LAD stenosis  Past Medical History:  Diagnosis Date   Arthritis of low back    GERD (gastroesophageal reflux disease)    High cholesterol    History of kidney stones    Hypertension    diagnosed years ago, lost weight, no longer on medications    Past Surgical History:  Procedure Laterality Date   BICEPS TENDON REPAIR Bilateral    CYSTOSCOPY/URETEROSCOPY/HOLMIUM LASER/STENT PLACEMENT Right 02/06/2019   Procedure: CYSTOSCOPY; RIGHT URETEROSCOPY; RIGHT URETERAL STENT PLACEMENT; BALLOON DILATION OF URETERAL STRICTURE; BASKET STONE EXTRACTION;  Surgeon: Bjorn PippinWrenn, John, MD;  Location: [facility];  Service: Urology;  Laterality: Right;   LUMBAR LAMINECTOMY/DECOMPRESSION MICRODISCECTOMY Left 04/25/2019   Procedure: LEFT SIDED LUMBAR 4-5 MICRODISECTOMY;  Surgeon: Estill Bambergumonski, Mark, MD;  Location: [facility];  Service: Orthopedics;  Laterality: Left;   NECK SURGERY     TRANSFORAMINAL LUMBAR INTERBODY FUSION  (TLIF) WITH PEDICLE SCREW FIXATION 1 LEVEL Left 08/21/2019   Procedure: LEFT-SIDED LUMBAR 4- LUMBAR 5 TRANSFORAMINAL LUMBAR INTERBODY FUSION WITH INSTRUMENTATION AND ALLOGRAFT;  Surgeon: Estill Bambergumonski, Mark, MD;  Location: [facility];  Service: Orthopedics;  Laterality: Left;   UPPER GASTROINTESTINAL ENDOSCOPY      Current Medications: Current Meds  Medication Sig   aspirin EC 81 MG tablet Take 1 tablet (81 mg total) by mouth daily. Swallow whole.   nitroGLYCERIN (NITROSTAT) 0.4 MG SL tablet Place 1 tablet (0.4 mg total) under the tongue every 5 (five) minutes as needed for chest pain. For a maximum of 3 tablets in one day.   omeprazole (PRILOSEC) 20 MG capsule Take 1 capsule (20 mg total) by mouth daily.   pravastatin (PRAVACHOL) 20 MG tablet Take 1 tablet (20 mg total) by mouth every evening.     Allergies:   Niacin and related, Statins, Tramadol, and Vicodin [hydrocodone-acetaminophen]   Social History   Socioeconomic History   Marital status: Married    Spouse name: Not on file   Number of children: Not on file   Years of education: Not on file   Highest education level: Not on file  Occupational History   Not on file  Tobacco Use   Smoking status: Never   Smokeless tobacco: Never  Vaping Use   Vaping Use: Never used  Substance and Sexual Activity   Alcohol use: Not Currently   Drug use: Never   Sexual activity: Yes    Partners: Female    Birth control/protection: None  Other Topics Concern   Not on file  Social History Narrative   Not on file   Social Determinants of Health   Financial Resource Strain: Not on file  Food Insecurity: Not on file  Transportation Needs: Not on file  Physical Activity: Not on file  Stress: Not on file  Social Connections: Not on file     Family History: The patient's family history includes Coronary artery disease in his father; Diabetes in his brother and paternal grandmother; Prostate cancer in his father and paternal uncle; Stroke in his  paternal aunt.  ROS:   Please see the history of present illness.     All other systems reviewed and are negative.  EKGs/Labs/Other Studies Reviewed:    The following studies were reviewed today:   EKG:  EKG is  ordered today.  The ekg ordered today demonstrates normal sinus rhythm,  Recent Labs: 09/24/2020: ALT 62; TSH 2.100 11/12/2020: BUN 12; Creatinine, Ser 1.07; Potassium 5.1; Sodium 139 11/30/2020: Hemoglobin 17.4; Platelets 258  Recent Lipid Panel    Component Value Date/Time   CHOL 261 (H) 09/24/2020 0936   TRIG 306 (H) 09/24/2020 0936   HDL 30 (L) 09/24/2020 0936   CHOLHDL 8.7 (H) 09/24/2020 0936   LDLCALC 172 (H) 09/24/2020 0936     Risk Assessment/Calculations:          Physical Exam:    VS:  BP 138/90 (BP Location: Left Arm, Patient Position: Sitting, Cuff Size: Normal)   Pulse 78   Ht 5\' 10"  (1.778 m)   Wt 221 lb 6 oz (100.4 kg)   SpO2 97%   BMI 31.76 kg/m     Wt Readings from Last 3 Encounters:  11/30/20 221 lb 6 oz (100.4 kg)  11/12/20 227 lb (103 kg)  09/24/20 226 lb 12.8 oz (102.9 kg)     GEN:  Well nourished, well developed in no acute distress HEENT: Normal NECK: No JVD; No carotid bruits LYMPHATICS: No lymphadenopathy CARDIAC: RRR, no murmurs, rubs, gallops RESPIRATORY:  Clear to auscultation without rales, wheezing or rhonchi  ABDOMEN: Soft, non-tender, non-distended MUSCULOSKELETAL:  No edema; No deformity  SKIN: Warm and dry NEUROLOGIC:  Alert and oriented x 3 PSYCHIATRIC:  Normal affect   ASSESSMENT:    1. Coronary artery disease involving native coronary artery of native heart, unspecified whether angina present   2. Mixed hyperlipidemia     PLAN:    In order of problems listed above:  Chest pain, risk factors hyperlipidemia, previous history of hypertension.  Coronary CTA with significant stenosis in the proximal to mid LAD. echocardiogram is pending.  Continue aspirin, PCSK9.  Sublingual nitro.  Schedule left heart cath.   Obtain echo as scheduled.  He plans to go on vacation next week, encourage patient to obtain heart cath ASAP.  He states he is willing to take the chance. Hyperlipidemia, intolerant to statins.  Repatha as prescribed.  If not improved, plan to try Pravachol and titrate dose.  Previously intolerant to statins.   Follow-up in 1 month   Shared Decision Making/Informed Consent The risks [stroke (1 in 1000), death (1 in 1000), kidney failure [usually temporary] (1 in 500), bleeding (1 in 200), allergic reaction [possibly serious] (1 in 200)], benefits (diagnostic support and management of coronary artery disease) and alternatives of a cardiac catheterization were discussed in detail with Mr. Drudge and he is willing to proceed.   Medication Adjustments/Labs and Tests Ordered: Current medicines are reviewed at length with the patient today.  Concerns regarding medicines are outlined above.  Orders Placed This Encounter  Procedures   CBC   Basic metabolic panel   EKG 12-Lead     Meds ordered this encounter  Medications   pravastatin (PRAVACHOL) 20 MG tablet    Sig: Take 1 tablet (20 mg total) by mouth every evening.    Dispense:  30 tablet    Refill:  3      Patient Instructions  Medication Instructions:   Your physician has recommended you make the following change in your medication:    START taking Pravachol 20 MG once a day in the evening.  *If you need a refill on your cardiac medications before your next appointment, please call your pharmacy*   Lab Work:  Please go to the Medical Mall after this appointment to have lab work drawn (CBC, BMP)   Testing/Procedures:   [facility] [place] [address] [place] [address] Dept: [phone] Loc: [phone]  Jeremiah Curci  11/30/2020  You are scheduled for a Cardiac Catheterization on Monday, September 12 with Dr. Lorine Bears.  1. Please arrive at the Medical Mall at 8:30 AM (This time is one hours before your procedure to ensure your preparation). Free valet parking service is available.   Special note: Every effort is made to have your procedure done on time. Please understand that emergencies sometimes delay scheduled procedures.  2. Diet: Do not eat solid foods after midnight.  The patient may have clear liquids until 5am upon the day of the procedure.  3. Labs:  Drawn the day of your Office Visit  4. Medication instructions in preparation for your procedure:   Contrast Allergy: No   *For reference purposes while preparing patient instructions.   Delete this med list prior to printing instructions for patient.*    On the morning of your procedure, take your Aspirin and any morning medicines NOT listed above.  You may use sips of water.  5. Plan for one night stay--bring personal belongings. 6. Bring a current list of your medications and current insurance cards. 7. You MUST have a responsible person to drive you home. 8. Someone MUST be with you the first 24 hours after you arrive home or your discharge will be delayed. 9. Please wear clothes that are easy to get on and off and wear slip-on shoes.  Thank you for allowing [place] to care for you!   -- [place] [facility]    Follow-Up: At [facility], you and your health needs are our priority.  As part of our continuing mission to provide you with exceptional heart care, we have created designated Provider Care Teams.  These Care Teams include your primary Cardiologist (physician) and Advanced Practice Providers (APPs -  Physician Assistants and Nurse Practitioners) who all work together to provide you with the care you need, when you need it.  We recommend signing up for the patient portal called "MyChart".  Sign up information is provided on this After Visit Summary.  MyChart is used to connect with patients for  Virtual Visits (Telemedicine).  Patients are able to view lab/test results, encounter notes, upcoming appointments, etc.  Non-urgent messages can be sent to your provider as well.   To learn more about what you can do with MyChart, go to [email or web address].    Your next appointment:   1 month(s)  The format for your next appointment:   In Person  Provider:   Debbe Odea, MD   Other Instructions    Signed, Debbe Odea, MD  11/30/2020 12:32 PM    [facility]

## 2020-12-02 ENCOUNTER — Telehealth: Payer: Self-pay | Admitting: Cardiology

## 2020-12-02 NOTE — Telephone Encounter (Signed)
Patient would like to move his procedure appointment that is scheduled for next week.

## 2020-12-03 DIAGNOSIS — I2510 Atherosclerotic heart disease of native coronary artery without angina pectoris: Secondary | ICD-10-CM

## 2020-12-03 HISTORY — DX: Atherosclerotic heart disease of native coronary artery without angina pectoris: I25.10

## 2020-12-03 NOTE — Telephone Encounter (Signed)
Called patient and he was wanting to know if we could schedule his Cath Lab procedure any sooner as his wife has been talking to him and wants him to get it done sooner . I informed him that we do not have any available appointments sooner then 12/14/20 as next week is a holiday week. Patient is agreeable to keep his current appointment.

## 2020-12-14 ENCOUNTER — Encounter: Payer: Self-pay | Admitting: Cardiovascular Disease

## 2020-12-14 ENCOUNTER — Ambulatory Visit
Admission: RE | Admit: 2020-12-14 | Discharge: 2020-12-14 | Disposition: A | Discharge status: Home / Self Care | Payer: Managed Care, Other (non HMO) | Attending: Cardiovascular Disease | Admitting: Cardiovascular Disease

## 2020-12-14 ENCOUNTER — Encounter
Admission: RE | Disposition: A | Discharge status: Home / Self Care | Payer: Self-pay | Source: Home / Self Care | Attending: Cardiovascular Disease

## 2020-12-14 ENCOUNTER — Other Ambulatory Visit: Payer: Self-pay

## 2020-12-14 DIAGNOSIS — Z79899 Other long term (current) drug therapy: Secondary | ICD-10-CM | POA: Diagnosis not present

## 2020-12-14 DIAGNOSIS — I10 Essential (primary) hypertension: Secondary | ICD-10-CM | POA: Insufficient documentation

## 2020-12-14 DIAGNOSIS — I2510 Atherosclerotic heart disease of native coronary artery without angina pectoris: Secondary | ICD-10-CM

## 2020-12-14 DIAGNOSIS — E782 Mixed hyperlipidemia: Secondary | ICD-10-CM | POA: Insufficient documentation

## 2020-12-14 DIAGNOSIS — Z885 Allergy status to narcotic agent status: Secondary | ICD-10-CM | POA: Diagnosis not present

## 2020-12-14 DIAGNOSIS — Z7982 Long term (current) use of aspirin: Secondary | ICD-10-CM | POA: Diagnosis not present

## 2020-12-14 DIAGNOSIS — Z8249 Family history of ischemic heart disease and other diseases of the circulatory system: Secondary | ICD-10-CM | POA: Insufficient documentation

## 2020-12-14 DIAGNOSIS — Z888 Allergy status to other drugs, medicaments and biological substances status: Secondary | ICD-10-CM | POA: Diagnosis not present

## 2020-12-14 HISTORY — PX: LEFT HEART CATH AND CORONARY ANGIOGRAPHY: CATH118249

## 2020-12-14 HISTORY — PX: CORONARY PRESSURE/FFR WITH 3D MAPPING: CATH118309

## 2020-12-14 SURGERY — LEFT HEART CATH AND CORONARY ANGIOGRAPHY
Anesthesia: Moderate Sedation

## 2020-12-14 MED ORDER — SODIUM CHLORIDE 0.9 % IV SOLN: 250.0000 mL | INTRAVENOUS | Status: DC | PRN

## 2020-12-14 MED ORDER — VERAPAMIL HCL 2.5 MG/ML IV SOLN
INTRAVENOUS | Status: DC | PRN
  Administered 2020-12-14: 2.5 mg via INTRA_ARTERIAL

## 2020-12-14 MED ORDER — SODIUM CHLORIDE 0.9% FLUSH: 3.0000 mL | INTRAVENOUS | Status: DC | PRN

## 2020-12-14 MED ORDER — HEPARIN (PORCINE) IN NACL 1000-0.9 UT/500ML-% IV SOLN
INTRAVENOUS | Status: DC | PRN
  Administered 2020-12-14: 1000 mL

## 2020-12-14 MED ORDER — VERAPAMIL HCL 2.5 MG/ML IV SOLN
INTRAVENOUS | Status: AC
  Filled 2020-12-14: qty 2

## 2020-12-14 MED ORDER — LIDOCAINE HCL (PF) 1 % IJ SOLN
INTRAMUSCULAR | Status: DC | PRN
  Administered 2020-12-14: 2 mL

## 2020-12-14 MED ORDER — SODIUM CHLORIDE 0.9 % IV SOLN: INTRAVENOUS | Status: DC

## 2020-12-14 MED ORDER — HEPARIN (PORCINE) IN NACL 1000-0.9 UT/500ML-% IV SOLN
INTRAVENOUS | Status: AC
  Filled 2020-12-14: qty 1000

## 2020-12-14 MED ORDER — MIDAZOLAM HCL 2 MG/2ML IJ SOLN
INTRAMUSCULAR | Status: DC | PRN
  Administered 2020-12-14: 1 mg via INTRAVENOUS

## 2020-12-14 MED ORDER — ONDANSETRON HCL 4 MG/2ML IJ SOLN: 4.0000 mg | Freq: Four times a day (QID) | INTRAMUSCULAR | Status: DC | PRN

## 2020-12-14 MED ORDER — SODIUM CHLORIDE 0.9 % WEIGHT BASED INFUSION
3.0000 mL/kg/h | INTRAVENOUS | Status: AC
  Administered 2020-12-14: 3 mL/kg/h via INTRAVENOUS

## 2020-12-14 MED ORDER — ACETAMINOPHEN 325 MG PO TABS
650.0000 mg | ORAL_TABLET | ORAL | Status: DC | PRN
Start: 2020-12-14 — End: 2020-12-14

## 2020-12-14 MED ORDER — FENTANYL CITRATE PF 50 MCG/ML IJ SOSY
PREFILLED_SYRINGE | INTRAMUSCULAR | Status: AC
  Filled 2020-12-14: qty 1

## 2020-12-14 MED ORDER — FENTANYL CITRATE (PF) 100 MCG/2ML IJ SOLN
INTRAMUSCULAR | Status: DC | PRN
  Administered 2020-12-14: 50 ug via INTRAVENOUS

## 2020-12-14 MED ORDER — LIDOCAINE HCL 1 % IJ SOLN
INTRAMUSCULAR | Status: AC
  Filled 2020-12-14: qty 20

## 2020-12-14 MED ORDER — MIDAZOLAM HCL 2 MG/2ML IJ SOLN
INTRAMUSCULAR | Status: AC
  Filled 2020-12-14: qty 2

## 2020-12-14 MED ORDER — SODIUM CHLORIDE 0.9% FLUSH: 3.0000 mL | Freq: Two times a day (BID) | INTRAVENOUS | Status: DC

## 2020-12-14 MED ORDER — HEPARIN SODIUM (PORCINE) 1000 UNIT/ML IJ SOLN
INTRAMUSCULAR | Status: AC
  Filled 2020-12-14: qty 1

## 2020-12-14 MED ORDER — IOHEXOL 350 MG/ML SOLN
INTRAVENOUS | Status: DC | PRN
  Administered 2020-12-14: 73 mL

## 2020-12-14 MED ORDER — SODIUM CHLORIDE 0.9 % WEIGHT BASED INFUSION: 1.0000 mL/kg/h | INTRAVENOUS | Status: DC

## 2020-12-14 MED ORDER — HEPARIN SODIUM (PORCINE) 1000 UNIT/ML IJ SOLN
INTRAMUSCULAR | Status: DC | PRN
  Administered 2020-12-14 (×2): 5000 [IU] via INTRAVENOUS

## 2020-12-14 SURGICAL SUPPLY — 16 items
CATH INFINITI 5 FR JL3.5 (CATHETERS) ×1 IMPLANT
CATH INFINITI JR4 5F (CATHETERS) ×1 IMPLANT
CATH LAUNCHER 6FR EBU3.5 (CATHETERS) ×1 IMPLANT
DEVICE RAD TR BAND REGULAR (VASCULAR PRODUCTS) ×1 IMPLANT
DRAPE BRACHIAL (DRAPES) ×1 IMPLANT
GLIDESHEATH SLEND SS 6F .021 (SHEATH) ×1 IMPLANT
GUIDEWIRE INQWIRE 1.5J.035X260 (WIRE) IMPLANT
GUIDEWIRE PRESS OMNI 185 ST (WIRE) ×1 IMPLANT
INQWIRE 1.5J .035X260CM (WIRE) ×3
PACK CARDIAC CATH (CUSTOM PROCEDURE TRAY) ×3 IMPLANT
PROTECTION STATION PRESSURIZED (MISCELLANEOUS) ×3
SET ATX SIMPLICITY (MISCELLANEOUS) ×1 IMPLANT
STATION PROTECTION PRESSURIZED (MISCELLANEOUS) IMPLANT
TUBING CIL FLEX 10 FLL-RA (TUBING) ×1 IMPLANT
VALVE COPILOT STAT (MISCELLANEOUS) ×1 IMPLANT
WIRE G INSERTION TOOL (WIRE) ×1 IMPLANT

## 2020-12-14 NOTE — Discharge Instructions (Signed)
Radial Site Care Refer to this sheet in the next few weeks. These instructions provide you with information about caring for yourself after your procedure. Your health care provider may also give you more specific instructions. Your treatment has been planned according to current medical practices, but problems sometimes occur. Call your health care provider if you have any problems or questions after your procedure. What can I expect after the procedure? After your procedure, it is typical to have the following: Bruising at the radial site that usually fades within 1-2 weeks. Blood collecting in the tissue (hematoma) that may be painful to the touch. It should usually decrease in size and tenderness within 1-2 weeks.  Follow these instructions at home: Take medicines only as directed by your health care provider. If you are on a medication called Metformin please do not take for 48 hours after your procedure. Over the next 48hrs please increase your fluid intake of water and non caffeine beverages to flush the contrast dye out of your system.  You may shower 24 hours after the procedure  Leave your bandage on and gently wash the site with plain soap and water. Pat the area dry with a clean towel. Do not rub the site, because this may cause bleeding.  Remove your dressing 48hrs after your procedure and leave open to air.  Do not submerge your site in water for 7 days. This includes swimming and washing dishes.  Check your insertion site every day for redness, swelling, or drainage. Do not apply powder or lotion to the site. Do not flex or bend the affected arm for 24 hours or as directed by your health care provider. Do not push or pull heavy objects with the affected arm for 24 hours or as directed by your health care provider. Do not lift over 10 lb (4.5 kg) for 5 days after your procedure or as directed by your health care provider. Ask your health care provider when it is okay to: Return to  work or school. Resume usual physical activities or sports. Resume sexual activity. Do not drive home if you are discharged the same day as the procedure. Have someone else drive you. You may drive 48 hours after the procedure Do not operate machinery or power tools for 24 hours after the procedure. If your procedure was done as an outpatient procedure, which means that you went home the same day as your procedure, a responsible adult should be with you for the first 24 hours after you arrive home. Keep all follow-up visits as directed by your health care provider. This is important. Contact a health care provider if: You have a fever. You have chills. You have increased bleeding from the radial site. Hold pressure on the site. Get help right away if: You have unusual pain at the radial site. You have redness, warmth, or swelling at the radial site. You have drainage (other than a small amount of blood on the dressing) from the radial site. The radial site is bleeding, and the bleeding does not stop after 15 minutes of holding steady pressure on the site. Your arm or hand becomes pale, cool, tingly, or numb. This information is not intended to replace advice given to you by your health care provider. Make sure you discuss any questions you have with your health care provider. Document Released: 04/23/2010 Document Revised: 08/27/2015 Document Reviewed: 10/07/2013 Elsevier Interactive Patient Education  2018 [employer]  

## 2020-12-14 NOTE — Interval H&P Note (Signed)
Cath Lab Visit (complete for each Cath Lab visit)  Clinical Evaluation Leading to the Procedure:   ACS: No.  Non-ACS:    Anginal Classification: CCS II  Anti-ischemic medical therapy: No Therapy  Non-Invasive Test Results: High-risk stress test findings: cardiac mortality >3%/year  Prior CABG: No previous CABG      History and Physical Interval Note:  12/14/2020 10:11 AM  Danny Ayers  has presented today for surgery, with the diagnosis of LT Cath    CAD.  The various methods of treatment have been discussed with the patient and family. After consideration of risks, benefits and other options for treatment, the patient has consented to  Procedure(s): LEFT HEART CATH AND CORONARY ANGIOGRAPHY (Left) as a surgical intervention.  The patient's history has been reviewed, patient examined, no change in status, stable for surgery.  I have reviewed the patient's chart and labs.  Questions were answered to the patient's satisfaction.     Lorine Bears

## 2020-12-17 ENCOUNTER — Other Ambulatory Visit: Payer: Self-pay

## 2020-12-17 ENCOUNTER — Ambulatory Visit (INDEPENDENT_AMBULATORY_CARE_PROVIDER_SITE_OTHER): Payer: Managed Care, Other (non HMO)

## 2020-12-17 DIAGNOSIS — R072 Precordial pain: Secondary | ICD-10-CM

## 2020-12-17 LAB — ECHOCARDIOGRAM COMPLETE
AR max vel: 2.56 cm2
AV Area VTI: 2.8 cm2
AV Area mean vel: 2.65 cm2
AV Mean grad: 3 mmHg
AV Peak grad: 5.9 mmHg
Ao pk vel: 1.21 m/s
Area-P 1/2: 2.93 cm2
S' Lateral: 2.8 cm
Single Plane A4C EF: 69.9 %

## 2020-12-23 ENCOUNTER — Telehealth: Payer: Self-pay

## 2020-12-23 ENCOUNTER — Other Ambulatory Visit: Payer: Self-pay

## 2020-12-23 ENCOUNTER — Encounter: Payer: Self-pay | Admitting: Internal Medicine

## 2020-12-23 ENCOUNTER — Ambulatory Visit (INDEPENDENT_AMBULATORY_CARE_PROVIDER_SITE_OTHER): Payer: Managed Care, Other (non HMO) | Admitting: Internal Medicine

## 2020-12-23 ENCOUNTER — Ambulatory Visit (INDEPENDENT_AMBULATORY_CARE_PROVIDER_SITE_OTHER): Payer: Managed Care, Other (non HMO)

## 2020-12-23 VITALS — BP 154/90 | HR 94 | Ht 70.0 in | Wt 233.0 lb

## 2020-12-23 DIAGNOSIS — I2510 Atherosclerotic heart disease of native coronary artery without angina pectoris: Secondary | ICD-10-CM

## 2020-12-23 DIAGNOSIS — S40021A Contusion of right upper arm, initial encounter: Secondary | ICD-10-CM

## 2020-12-23 DIAGNOSIS — S60211A Contusion of right wrist, initial encounter: Secondary | ICD-10-CM

## 2020-12-23 DIAGNOSIS — E782 Mixed hyperlipidemia: Secondary | ICD-10-CM | POA: Diagnosis not present

## 2020-12-23 DIAGNOSIS — R0789 Other chest pain: Secondary | ICD-10-CM

## 2020-12-23 MED ORDER — ROSUVASTATIN CALCIUM 40 MG PO TABS: 40.0000 mg | ORAL_TABLET | Freq: Every day | ORAL | 3 refills | Status: DC

## 2020-12-23 MED ORDER — NITROGLYCERIN 0.4 MG SL SUBL: 0.4000 mg | SUBLINGUAL_TABLET | SUBLINGUAL | 1 refills | Status: DC | PRN

## 2020-12-23 NOTE — Progress Notes (Signed)
Follow-up Outpatient Visit Date: 12/23/2020  Primary Care Provider: Mayer Masker, PA-C [address] [place] [address]  Primary Cardiologist: Debbe Odea, MD  Chief Complaint: Right forearm pain after catheterization  HPI:  Danny Ayers is a 60 y.o. male with history of nonobstructive coronary artery disease, hypertension, and hyperlipidemia, who presents for urgent evaluation of right forearm hematoma following diagnostic catheterization with Dr. Kirke Corin last week.  He was last seen by Dr. James Ivanoff in late August, at which time results of coronary CTA demonstrating significant proximal/mid LAD disease was reviewed.  He was referred for catheterization, which was performed on 12/14/2020.  This demonstrated 50% proximal-mid LAD stenosis that was not hemodynamically significant (iFR 0.92).  60-70% ostial D1 disease was also noted.  Today, Danny Ayers reports that he is doing fairly well.  He initially did not have any problems with his right forearm other than some bruising from hematoma that occurred at the time of his catheterization.  He did not do any heavy lifting or strenuous activity with his right arm for 5 days, as recommended after catheterization.  However, when he began using his hand again at work, he suddenly detected more soreness and also felt a knot just above the arteriotomy site.  He feels some pain when he makes a fist as well as if he puts pressure on the volar surface of his distal forearm.  He does not have any numbness or weakness in his hand.  He denies bleeding.  He otherwise feels well without chest pain, shortness of breath, palpitations, and lightheadedness.  --------------------------------------------------------------------------------------------------  Past Medical History:  Diagnosis Date   Arthritis of low back    Coronary artery disease 12/2020   50% proximal-mid LAD (iFR 0.92), 60-70% D1, and 20-35% proximal RCA dz.   GERD  (gastroesophageal reflux disease)    High cholesterol    History of kidney stones    Hypertension    diagnosed years ago, lost weight, no longer on medications   Past Surgical History:  Procedure Laterality Date   BICEPS TENDON REPAIR Bilateral    CARDIAC CATHETERIZATION     CORONARY PRESSURE WIRE/FFR WITH 3D MAPPING N/A 12/14/2020   Procedure: Coronary Pressure Wire/FFR w/3D Mapping;  Surgeon: Iran Ouch, MD;  Location: [facility] INVASIVE CV LAB;  Service: Cardiovascular;  Laterality: N/A;   CYSTOSCOPY/URETEROSCOPY/HOLMIUM LASER/STENT PLACEMENT Right 02/06/2019   Procedure: CYSTOSCOPY; RIGHT URETEROSCOPY; RIGHT URETERAL STENT PLACEMENT; BALLOON DILATION OF URETERAL STRICTURE; BASKET STONE EXTRACTION;  Surgeon: Bjorn Pippin, MD;  Location: [facility];  Service: Urology;  Laterality: Right;   LEFT HEART CATH AND CORONARY ANGIOGRAPHY Left 12/14/2020   Procedure: LEFT HEART CATH AND CORONARY ANGIOGRAPHY;  Surgeon: Iran Ouch, MD;  Location: [facility] INVASIVE CV LAB;  Service: Cardiovascular;  Laterality: Left;   LUMBAR LAMINECTOMY/DECOMPRESSION MICRODISCECTOMY Left 04/25/2019   Procedure: LEFT SIDED LUMBAR 4-5 MICRODISECTOMY;  Surgeon: Estill Bamberg, MD;  Location: [facility] OR;  Service: Orthopedics;  Laterality: Left;   NECK SURGERY     TRANSFORAMINAL LUMBAR INTERBODY FUSION (TLIF) WITH PEDICLE SCREW FIXATION 1 LEVEL Left 08/21/2019   Procedure: LEFT-SIDED LUMBAR 4- LUMBAR 5 TRANSFORAMINAL LUMBAR INTERBODY FUSION WITH INSTRUMENTATION AND ALLOGRAFT;  Surgeon: Estill Bamberg, MD;  Location: [facility] OR;  Service: Orthopedics;  Laterality: Left;   UPPER GASTROINTESTINAL ENDOSCOPY       Recent CV Pertinent Labs: Lab Results  Component Value Date   CHOL 261 (H) 09/24/2020   HDL 30 (L) 09/24/2020   LDLCALC 172 (H) 09/24/2020  TRIG 306 (H) 09/24/2020   CHOLHDL 8.7 (H) 09/24/2020   INR 0.9 08/13/2019   K 4.3 11/30/2020   BUN 12 11/30/2020   BUN 12 11/12/2020   CREATININE 1.17 11/30/2020    Past  medical and surgical history were reviewed and updated in EPIC.  Current Meds  Medication Sig   aspirin EC 81 MG tablet Take 1 tablet (81 mg total) by mouth daily. Swallow whole.   omeprazole (PRILOSEC) 20 MG capsule Take 1 capsule (20 mg total) by mouth daily.   rosuvastatin (CRESTOR) 40 MG tablet Take 1 tablet (40 mg total) by mouth daily.   [DISCONTINUED] nitroGLYCERIN (NITROSTAT) 0.4 MG SL tablet Place 1 tablet (0.4 mg total) under the tongue every 5 (five) minutes as needed for chest pain. For a maximum of 3 tablets in one day.   [DISCONTINUED] pravastatin (PRAVACHOL) 20 MG tablet Take 1 tablet (20 mg total) by mouth every evening.    Allergies: Niacin and related  Social History   Tobacco Use   Smoking status: Never   Smokeless tobacco: Never  Vaping Use   Vaping Use: Never used  Substance Use Topics   Alcohol use: Not Currently   Drug use: Never    Family History  Problem Relation Age of Onset   Prostate cancer Father    Coronary artery disease Father    Diabetes Brother    Prostate cancer Paternal Uncle    Diabetes Paternal Grandmother    Stroke Paternal Aunt     Review of Systems: A 12-system review of systems was performed and was negative except as noted in the HPI.  --------------------------------------------------------------------------------------------------  Physical Exam: BP (!) 154/90 (BP Location: Left Arm, Patient Position: Sitting, Cuff Size: Large)   Pulse 94   Ht 5\' 10"  (1.778 m)   Wt 233 lb (105.7 kg)   SpO2 95%   BMI 33.43 kg/m   General:  NAD. Neck: No JVD or HJR. Lungs: Clear to auscultation bilaterally without wheezes or crackles. Heart: Regular rate and rhythm without murmurs, rubs, or gallops. Abdomen: Soft, nontender, nondistended. Extremities: Right radial arteriotomy site healed.  There is some bruising proximal to the site with mild tenderness.  A small nodule proximal to the arteriotomy site is palpable without pulsation.  No  bruit appreciated.  Right hand sensation and grip strength are normal.  There is normal capillary refill.  Right radial pulse is 2+.   Lab Results  Component Value Date   WBC 7.1 11/30/2020   HGB 17.4 (H) 11/30/2020   HCT 48.0 11/30/2020   MCV 89.9 11/30/2020   PLT 258 11/30/2020    Lab Results  Component Value Date   NA 136 11/30/2020   K 4.3 11/30/2020   CL 106 11/30/2020   CO2 21 (L) 11/30/2020   BUN 12 11/30/2020   CREATININE 1.17 11/30/2020   GLUCOSE 152 (H) 11/30/2020   ALT 62 (H) 09/24/2020    Lab Results  Component Value Date   CHOL 261 (H) 09/24/2020   HDL 30 (L) 09/24/2020   LDLCALC 172 (H) 09/24/2020   TRIG 306 (H) 09/24/2020   CHOLHDL 8.7 (H) 09/24/2020    --------------------------------------------------------------------------------------------------  ASSESSMENT AND PLAN: Right forearm hematoma following diagnostic catheterization: Examination today is relatively benign with some bruising evident.  Small nodule proximal to arteriotomy site may reflect resolving hematoma.  I do not appreciate a bruit on exam.  Arterial duplex study was performed in the office today which shows no evidence of pseudoaneurysm, AV fistula, or  large fluid collection.  I suspect that his discomfort and small area of nodularity reflect resolving hematoma after recent catheterization.  I have advised him to use as needed acetaminophen for pain control.  He can also apply a warm or cool compress at his discretion.  I think it is reasonable to return to work and increase activity with the right arm as tolerated.  I expect the hematoma and discomfort will resolve over the next 1 to 2 weeks.  Coronary artery disease: Recent catheterization showed moderate coronary artery disease that was not hemodynamically significant in the LAD.  Continue current medications to prevent progression of disease.  Subsequent echo showed preserved LVEF without significant valvular  abnormality.  Hyperlipidemia: Lipid panel in June notable for elevated triglycerides and LDL in the setting of pravastatin use.  The patient has had difficulties with other statins in the past, most notably headaches.  He also had this with pravastatin but reports that it resolved after a day or 2.  He is willing to try a higher potency statin again.  We will switch him to rosuvastatin 40 mg daily.  He will need a follow-up lipid panel and ALT in 3 months, which can be drawn when he follows up with Dr. Ulyses Jarred.  Follow-up: Return to clinic in 3 months with Dr. Azucena Cecil.  Danny Kendall, MD 12/25/2020 9:49 AM

## 2020-12-23 NOTE — Telephone Encounter (Signed)
Called patient about an echo result and he informed me that he was a bit worried about his wrist and arm where he was cathed on 9/12. He stated that there was a hematoma post cath that was resolved prior to discharge, and since discharge he has continued to have pain and there is now a knot the size of a 50 cent piece directly where the hematoma was with bruising all over his forearm. He has what he described as muscle pain when trying to grip anything and local pain at the site of the knot with any pressure or touch. stated it is a little warm to touch with some swelling in the area.   Sent Dr. Kirke Corin a secure chat with the above text. Waiting for a response.

## 2020-12-23 NOTE — Patient Instructions (Addendum)
Medication Instructions:   Your physician has recommended you make the following change in your medication:   STOP taking Simvastatin  START Rosuvastatin 40 mg daily   *If you need a refill on your cardiac medications before your next appointment, please call your pharmacy*   Lab Work: None ordered If you have labs (blood work) drawn today and your tests are completely normal, you will receive your results only by: MyChart Message (if you have MyChart) OR A paper copy in the mail If you have any lab test that is abnormal or we need to change your treatment, we will call you to review the results.   Testing/Procedures: Your Physician recommends that you have an upper extremity arterial doppler today.    Follow-Up: At [facility], you and your health needs are our priority.  As part of our continuing mission to provide you with exceptional heart care, we have created designated Provider Care Teams.  These Care Teams include your primary Cardiologist (physician) and Advanced Practice Providers (APPs -  Physician Assistants and Nurse Practitioners) who all work together to provide you with the care you need, when you need it.  We recommend signing up for the patient portal called "MyChart".  Sign up information is provided on this After Visit Summary.  MyChart is used to connect with patients for Virtual Visits (Telemedicine).  Patients are able to view lab/test results, encounter notes, upcoming appointments, etc.  Non-urgent messages can be sent to your provider as well.   To learn more about what you can do with MyChart, go to [email or web address].    Your next appointment:    3 months  The format for your next appointment:   In Person  Provider:   Debbe Odea, MD   Other Instructions N/A

## 2020-12-24 ENCOUNTER — Telehealth: Payer: Self-pay

## 2020-12-24 DIAGNOSIS — E782 Mixed hyperlipidemia: Secondary | ICD-10-CM

## 2020-12-24 MED ORDER — REPATHA SURECLICK 140 MG/ML ~~LOC~~ SOAJ
140.0000 mg | SUBCUTANEOUS | 11 refills | Status: DC
Start: 2020-12-24 — End: 2021-05-04

## 2020-12-24 NOTE — Telephone Encounter (Signed)
-----   Message from Gibson Ramp, RN sent at 12/24/2020  3:46 PM EDT ----- Regarding: FW: Repatha PA Any updates on this patients Repatha?  Thanks,  Coleen Picard-Tagnolli RN    ----- Message ----- From: Eather Colas, CMA Sent: 11/24/2020   3:09 PM EDT To: Gibson Ramp, RN Subject: RE: Repatha PA                                 Awaiting determination I will notify the pt once its approved  ----- Message ----- From: Gibson Ramp, RN Sent: 11/24/2020   2:39 PM EDT To: Eather Colas, CMA Subject: Repatha PA                                     Hi Jerriah Ines,  Any updates on this patients Repatha?  Coleen Picard-Tagnolli RN

## 2020-12-24 NOTE — Telephone Encounter (Signed)
Called and lmom pt that the repatha q14d was approved, rx sent to [facility] [address] Instructed the pt to complete labs post 4th dose (ordered and released). Instructed the pt to call me back so that I can email him the copay card information. Copay card is as follows: RxBin: [id number] RxPCN: [id number] RxGrp: [id number] ID: [id number]

## 2020-12-24 NOTE — Telephone Encounter (Signed)
Late Entry from 9/21: Dr. Kirke Corin requested patient be seen today. Gave him the DOD slot with Dr. Okey Dupre this afternoon. Patient was grateful for the appointment.

## 2020-12-25 ENCOUNTER — Encounter: Payer: Self-pay | Admitting: Internal Medicine

## 2020-12-25 DIAGNOSIS — S60211A Contusion of right wrist, initial encounter: Secondary | ICD-10-CM | POA: Insufficient documentation

## 2020-12-31 ENCOUNTER — Ambulatory Visit: Payer: Managed Care, Other (non HMO) | Admitting: Cardiology

## 2021-01-08 ENCOUNTER — Emergency Department (HOSPITAL_COMMUNITY)
Admission: EM | Admit: 2021-01-08 | Discharge: 2021-01-08 | Disposition: A | Discharge status: Home / Self Care | Payer: Managed Care, Other (non HMO) | Attending: Emergency Medicine | Admitting: Emergency Medicine

## 2021-01-08 ENCOUNTER — Emergency Department (HOSPITAL_COMMUNITY): Payer: Managed Care, Other (non HMO)

## 2021-01-08 DIAGNOSIS — Z7982 Long term (current) use of aspirin: Secondary | ICD-10-CM | POA: Insufficient documentation

## 2021-01-08 DIAGNOSIS — I10 Essential (primary) hypertension: Secondary | ICD-10-CM | POA: Diagnosis not present

## 2021-01-08 DIAGNOSIS — R109 Unspecified abdominal pain: Secondary | ICD-10-CM | POA: Diagnosis present

## 2021-01-08 DIAGNOSIS — I2510 Atherosclerotic heart disease of native coronary artery without angina pectoris: Secondary | ICD-10-CM | POA: Insufficient documentation

## 2021-01-08 DIAGNOSIS — N23 Unspecified renal colic: Secondary | ICD-10-CM | POA: Diagnosis not present

## 2021-01-08 DIAGNOSIS — K219 Gastro-esophageal reflux disease without esophagitis: Secondary | ICD-10-CM | POA: Insufficient documentation

## 2021-01-08 LAB — BASIC METABOLIC PANEL
Anion gap: 10 (ref 5–15)
BUN: 12 mg/dL (ref 6–20)
CO2: 22 mmol/L (ref 22–32)
Calcium: 9.2 mg/dL (ref 8.9–10.3)
Chloride: 107 mmol/L (ref 98–111)
Creatinine, Ser: 1.2 mg/dL (ref 0.61–1.24)
GFR, Estimated: >60 mL/min (ref >60)
Glucose, Bld: 145 mg/dL — ABNORMAL HIGH (ref 70–99)
Potassium: 3.9 mmol/L (ref 3.5–5.1)
Sodium: 139 mmol/L (ref 135–145)

## 2021-01-08 LAB — CBC
HCT: 46.3 % (ref 39.0–52.0)
Hemoglobin: 16.1 g/dL (ref 13.0–17.0)
MCH: 31.6 pg (ref 26.0–34.0)
MCHC: 34.8 g/dL (ref 30.0–36.0)
MCV: 91 fL (ref 80.0–100.0)
Platelets: 219 10*3/uL (ref 150–400)
RBC: 5.09 MIL/uL (ref 4.22–5.81)
RDW: 11.8 % (ref 11.5–15.5)
WBC: 6 10*3/uL (ref 4.0–10.5)
nRBC: 0 % (ref 0.0–0.2)

## 2021-01-08 LAB — URINALYSIS, ROUTINE W REFLEX MICROSCOPIC
Bacteria, UA: NONE SEEN
Bilirubin Urine: NEGATIVE
Glucose, UA: NEGATIVE mg/dL
Ketones, ur: NEGATIVE mg/dL
Leukocytes,Ua: NEGATIVE
Nitrite: NEGATIVE
Protein, ur: NEGATIVE mg/dL
RBC / HPF: >50 RBC/hpf — ABNORMAL HIGH (ref 0–5)
Specific Gravity, Urine: 1.021 (ref 1.005–1.030)
pH: 5 (ref 5.0–8.0)

## 2021-01-08 MED ORDER — OXYCODONE-ACETAMINOPHEN 5-325 MG PO TABS
2.0000 | ORAL_TABLET | Freq: Once | ORAL | Status: AC
  Administered 2021-01-08: 2 via ORAL
  Filled 2021-01-08: qty 2

## 2021-01-08 MED ORDER — HYDROCODONE-ACETAMINOPHEN 5-325 MG PO TABS: 1.0000 | ORAL_TABLET | Freq: Four times a day (QID) | ORAL | 0 refills | Status: DC | PRN

## 2021-01-08 MED ORDER — ONDANSETRON 4 MG PO TBDP
4.0000 mg | ORAL_TABLET | Freq: Once | ORAL | Status: AC
  Administered 2021-01-08: 4 mg via ORAL
  Filled 2021-01-08: qty 1

## 2021-01-08 MED ORDER — KETOROLAC TROMETHAMINE 15 MG/ML IJ SOLN
15.0000 mg | Freq: Once | INTRAMUSCULAR | Status: AC
  Administered 2021-01-08: 15 mg via INTRAMUSCULAR
  Filled 2021-01-08: qty 1

## 2021-01-08 NOTE — ED Provider Notes (Signed)
Emergency Medicine Provider Triage Evaluation Note  Danny Ayers , a 60 y.o. male  was evaluated in triage.  Pt complains of left flank pain.  History of kidney stones.  Patient awoke and began having urinary urgency followed by severe flank pain.  Patient rates his current pain at 10 out of 10.  He has had some nausea without vomiting..  Review of Systems  Positive: Left flank pain Negative: Fever  Physical Exam  BP (!) 137/95 (BP Location: Left Arm)   Pulse 82   Temp 98.4 F (36.9 C) (Oral)   Resp 20   Ht 5\' 10"  (1.778 m)   Wt 99.8 kg   SpO2 95%   BMI 31.57 kg/m  Gen:   Awake, no distress   Resp:  Normal effort  MSK:   Moves extremities without difficulty  Other:    Medical Decision Making  Medically screening exam initiated at 12:04 PM.  Appropriate orders placed.  Danny Ayers was informed that the remainder of the evaluation will be completed by another provider, this initial triage assessment does not replace that evaluation, and the importance of remaining in the ED until their evaluation is complete.  Patient here with left flank pain.  Kidney stone suspected.  Pain medications and work-up initiated.   Lance Muss, PA-C 01/08/21 1206    03/10/21, MD 01/08/21 1940

## 2021-01-08 NOTE — Discharge Instructions (Addendum)
Return for any problem.   Use ibuprofen -- 600 mg every 8 hours --as needed for pain.  If you have additional pain use prescribed Norco.

## 2021-01-08 NOTE — ED Notes (Signed)
Pt discharged and ambulated out of the ED without difficulty. 

## 2021-01-08 NOTE — ED Triage Notes (Signed)
Pt reports left flank pain 10/10 that began when he woke this morning. Pt reports pain has worsened this morning and was diaphoretic, pt reports needing to leave work due to pain.

## 2021-01-08 NOTE — ED Provider Notes (Signed)
[facility] EMERGENCY DEPARTMENT Provider Note   CSN: [id number] Arrival date & time: 01/08/21  1140     History Chief Complaint  Patient presents with   Flank Pain    Danny Ayers is a 60 y.o. male.  60 year old male with prior medical history as detailed below presents for evaluation.  Patient reports acute left flank pain earlier today.  Pain was significant and associated with a feeling of urinary urgency.  Symptoms were similar to prior renal colic.  Upon my evaluation he is significantly improved.  He denies associated fever.  The history is provided by the patient.  Flank Pain This is a new problem. The current episode started 12 to 24 hours ago. The problem occurs rarely. The problem has been resolved. Pertinent negatives include no chest pain and no abdominal pain. Nothing aggravates the symptoms. Nothing relieves the symptoms.      Past Medical History:  Diagnosis Date   Arthritis of low back    Coronary artery disease 12/2020   50% proximal-mid LAD (iFR 0.92), 60-70% D1, and 20-35% proximal RCA dz.   GERD (gastroesophageal reflux disease)    High cholesterol    History of kidney stones    Hypertension    diagnosed years ago, lost weight, no longer on medications    Patient Active Problem List   Diagnosis Date Noted   Traumatic hematoma of right wrist 12/25/2020   Coronary artery disease    Chest pain 09/24/2020   Arthralgia of right temporomandibular joint 09/24/2020   Healthcare maintenance 09/24/2020   Screening for prostate cancer 09/24/2020   Tinnitus of both ears 09/10/2020   Otalgia, bilateral 09/10/2020   Screening for colon cancer 09/10/2020   Radiculopathy 08/21/2019   Prediabetes 01/21/2019   Elevated ALT measurement 01/21/2019   Vitamin D deficiency 01/21/2019   Lumbar radiculopathy 01/03/2019   Left hip pain 12/13/2018   Elevated blood-pressure reading without diagnosis of hypertension 02/21/2018   Mixed hyperlipidemia  02/21/2018   Statin intolerance- "severe HA's to all statins/ chol meds" 02/21/2018   Obesity, Class I, BMI 30-34.9 02/21/2018   Gastroesophageal reflux disease 02/21/2018   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/2018   Degenerative disc disease, lumbar 02/21/2018   h/o Cervical vertebral fusion 02/21/2018   Erectile dysfunction 02/21/2018   FHx: early coronary artery disease- Dad in 94's, sev others 02/21/2018   Family history of combined hyperlipidemia 02/21/2018   Family history of essential hypertension 02/21/2018   Family history of prostate cancer in father 02/21/2018   Chronic bilateral low back pain 02/21/2018   Personal history of primary hypertension 02/21/2018   h/o B/L Biceps tendon rupture with surgical repair B/L 02/21/2018   Family history of diabetes mellitus in brother, father/ other relatives 02/21/2018    Past Surgical History:  Procedure Laterality Date   BICEPS TENDON REPAIR Bilateral    CARDIAC CATHETERIZATION     CORONARY PRESSURE WIRE/FFR WITH 3D MAPPING N/A 12/14/2020   Procedure: Coronary Pressure Wire/FFR w/3D Mapping;  Surgeon: Iran Ouch, MD;  Location: [facility] INVASIVE CV LAB;  Service: Cardiovascular;  Laterality: N/A;   CYSTOSCOPY/URETEROSCOPY/HOLMIUM LASER/STENT PLACEMENT Right 02/06/2019   Procedure: CYSTOSCOPY; RIGHT URETEROSCOPY; RIGHT URETERAL STENT PLACEMENT; BALLOON DILATION OF URETERAL STRICTURE; BASKET STONE EXTRACTION;  Surgeon: Bjorn Pippin, MD;  Location: [facility];  Service: Urology;  Laterality: Right;   LEFT HEART CATH AND CORONARY ANGIOGRAPHY Left 12/14/2020   Procedure: LEFT HEART CATH AND CORONARY ANGIOGRAPHY;  Surgeon: Iran Ouch, MD;  Location: [facility] INVASIVE CV LAB;  Service: Cardiovascular;  Laterality: Left;   LUMBAR LAMINECTOMY/DECOMPRESSION MICRODISCECTOMY Left 04/25/2019   Procedure: LEFT SIDED LUMBAR 4-5 MICRODISECTOMY;  Surgeon: Estill Bamberg, MD;  Location: [facility] OR;  Service: Orthopedics;  Laterality: Left;   NECK SURGERY      TRANSFORAMINAL LUMBAR INTERBODY FUSION (TLIF) WITH PEDICLE SCREW FIXATION 1 LEVEL Left 08/21/2019   Procedure: LEFT-SIDED LUMBAR 4- LUMBAR 5 TRANSFORAMINAL LUMBAR INTERBODY FUSION WITH INSTRUMENTATION AND ALLOGRAFT;  Surgeon: Estill Bamberg, MD;  Location: [facility] OR;  Service: Orthopedics;  Laterality: Left;   UPPER GASTROINTESTINAL ENDOSCOPY         Family History  Problem Relation Age of Onset   Prostate cancer Father    Coronary artery disease Father    Diabetes Brother    Prostate cancer Paternal Uncle    Diabetes Paternal Grandmother    Stroke Paternal Aunt     Social History   Tobacco Use   Smoking status: Never   Smokeless tobacco: Never  Vaping Use   Vaping Use: Never used  Substance Use Topics   Alcohol use: Not Currently   Drug use: Never    Home Medications Prior to Admission medications   Medication Sig Start Date End Date Taking? Authorizing Provider  aspirin EC 81 MG tablet Take 1 tablet (81 mg total) by mouth daily. Swallow whole. 11/12/20  Yes Agbor-Etang, Arlys John, MD  HYDROcodone-acetaminophen (NORCO/VICODIN) 5-325 MG tablet Take 1 tablet by mouth every 6 (six) hours as needed. 01/08/21  Yes Wynetta Fines, MD  nitroGLYCERIN (NITROSTAT) 0.4 MG SL tablet Place 1 tablet (0.4 mg total) under the tongue every 5 (five) minutes as needed for chest pain. For a maximum of 3 tablets in one day. 12/23/20 03/23/21 Yes End, Cristal Deer, MD  omeprazole (PRILOSEC) 20 MG capsule Take 1 capsule (20 mg total) by mouth daily. 09/10/20  Yes Boscia, Kathlynn Grate, NP  pravastatin (PRAVACHOL) 20 MG tablet Take 20 mg by mouth daily.   Yes [provider]  Evolocumab (REPATHA SURECLICK) 140 MG/ML SOAJ Inject 140 mg into the skin every 14 (fourteen) days. Patient not taking: No sig reported 12/24/20   Debbe Odea, MD  rosuvastatin (CRESTOR) 40 MG tablet Take 1 tablet (40 mg total) by mouth daily. 12/23/20 04/22/21  End, Cristal Deer, MD    Allergies    Niacin and related  Review  of Systems   Review of Systems  Cardiovascular:  Negative for chest pain.  Gastrointestinal:  Negative for abdominal pain.  Genitourinary:  Positive for flank pain.  All other systems reviewed and are negative.  Physical Exam Updated Vital Signs BP 140/90   Pulse 81   Temp 98.4 F (36.9 C) (Oral)   Resp 16   Ht 5\' 10"  (1.778 m)   Wt 99.8 kg   SpO2 92% Comment: RN aware  BMI 31.57 kg/m   Physical Exam Vitals and nursing note reviewed.  Constitutional:      General: He is not in acute distress.    Appearance: Normal appearance. He is well-developed.  HENT:     Head: Normocephalic and atraumatic.  Eyes:     Conjunctiva/sclera: Conjunctivae normal.     Pupils: Pupils are equal, round, and reactive to light.  Cardiovascular:     Rate and Rhythm: Normal rate and regular rhythm.     Heart sounds: Normal heart sounds.  Pulmonary:     Effort: Pulmonary effort is normal. No respiratory distress.     Breath sounds: Normal breath sounds.  Abdominal:     General: There  is no distension.     Palpations: Abdomen is soft.     Tenderness: There is no abdominal tenderness.  Musculoskeletal:        General: No deformity. Normal range of motion.     Cervical back: Normal range of motion and neck supple.  Skin:    General: Skin is warm and dry.  Neurological:     General: No focal deficit present.     Mental Status: He is alert and oriented to person, place, and time.    ED Results / Procedures / Treatments   Labs (all labs ordered are listed, but only abnormal results are displayed) Labs Reviewed  BASIC METABOLIC PANEL - Abnormal; Notable for the following components:      Result Value   Glucose, Bld 145 (*)    All other components within normal limits  URINALYSIS, ROUTINE W REFLEX MICROSCOPIC - Abnormal; Notable for the following components:   APPearance HAZY (*)    Hgb urine dipstick LARGE (*)    RBC / HPF >50 (*)    All other components within normal limits  CBC     EKG None  Radiology CT RENAL STONE STUDY  Result Date: 01/08/2021 CLINICAL DATA:  Abdominal pain EXAM: CT ABDOMEN AND PELVIS WITHOUT CONTRAST TECHNIQUE: Multidetector CT imaging of the abdomen and pelvis was performed following the standard protocol without IV contrast. COMPARISON:  CT abdomen and pelvis dated February 01, 2019 FINDINGS: Lower chest: No acute abnormality. Hepatobiliary: Hepatic steatosis. Gallbladder is normal in appearance. No biliary ductal dilation. Pancreas: Unremarkable. No pancreatic ductal dilatation or surrounding inflammatory changes. Spleen: Normal in size without focal abnormality. Adrenals/Urinary Tract: Bilateral adrenal glands are unremarkable. No hydronephrosis or nephrolithiasis. Minimal pelviectasis of the left kidney and mildly dilated left ureter. Stomach/Bowel: Stomach is within normal limits. Appendix appears normal. No evidence of bowel wall thickening, distention, or inflammatory changes. Vascular/Lymphatic: Aortic atherosclerosis. No enlarged abdominal or pelvic lymph nodes. Reproductive: Prostate is unremarkable. Other: Small fat containing left inguinal hernia. No abdominopelvic ascites. Musculoskeletal: Prior posterior fusion of L4-L5 with interbody spacer. No acute or significant osseous findings. IMPRESSION: Minimal pelviectasis of the left kidney and mildly dilated left ureter. A 2 mm calcification is seen in the posterior bladder. Findings may be due to recently passed stone. Hepatic steatosis. Aortic Atherosclerosis (ICD10-I70.0). Electronically Signed   By: Allegra Lai M.D.   On: 01/08/2021 14:23    Procedures Procedures   Medications Ordered in ED Medications  oxyCODONE-acetaminophen (PERCOCET/ROXICET) 5-325 MG per tablet 2 tablet (2 tablets Oral Given 01/08/21 1221)  ondansetron (ZOFRAN-ODT) disintegrating tablet 4 mg (4 mg Oral Given 01/08/21 1221)  ketorolac (TORADOL) 15 MG/ML injection 15 mg (15 mg Intramuscular Given 01/08/21 2015)     ED Course  I have reviewed the triage vital signs and the nursing notes.  Pertinent labs & imaging results that were available during my care of the patient were reviewed by me and considered in my medical decision making (see chart for details).    MDM Rules/Calculators/A&P                           MDM  MSE complete  Danny Ayers was evaluated in Emergency Department on 01/08/2021 for the symptoms described in the history of present illness. He was evaluated in the context of the global COVID-19 pandemic, which necessitated consideration that the patient might be at risk for infection with the SARS-CoV-2 virus that causes COVID-19. Institutional protocols  and algorithms that pertain to the evaluation of patients at risk for COVID-19 are in a state of rapid change based on information released by regulatory bodies including the CDC and federal and state organizations. These policies and algorithms were followed during the patient's care in the ED.  Patient is presenting with left sided renal colic.  At time of evaluation he appears to have passed the stone into his bladder.  He feels significantly improved.  Screening labs otherwise are without significant pathology.  Patient is already established with urology in the outpatient setting.  Patient does understand need for close follow-up.  Strict return precautions given and understood. Final Clinical Impression(s) / ED Diagnoses Final diagnoses:  Renal colic    Rx / DC Orders ED Discharge Orders          Ordered    HYDROcodone-acetaminophen (NORCO/VICODIN) 5-325 MG tablet  Every 6 hours PRN        01/08/21 2130             Wynetta Fines, MD 01/08/21 2141

## 2021-01-13 ENCOUNTER — Telehealth: Payer: Self-pay | Admitting: Cardiology

## 2021-01-13 DIAGNOSIS — Z0279 Encounter for issue of other medical certificate: Secondary | ICD-10-CM

## 2021-01-13 NOTE — Telephone Encounter (Signed)
Patient came by office Dropped off FMLA forms from [employer] to be completed Paid $29.00 cash fee and signed forms  Placed in nurse box

## 2021-01-19 NOTE — Telephone Encounter (Signed)
Called patient and clarified the dates that we could fill out his leave for. He stated that he just needed 12/14/20-12/18/20 post cath lab dates for the heavy lifting restrictions. I filled out paperwork and faxed it to the number provided. Handed folder with paperwork to Mignon Pine to scan into chart.

## 2021-01-19 NOTE — Telephone Encounter (Signed)
Lmov to discuss form completion.  Per DPR ok to leave a detailed msg.   Advised patient on vm forms scanned and patient portion to be completed and md original will be mailed to patient.

## 2021-03-22 ENCOUNTER — Other Ambulatory Visit: Payer: Self-pay | Admitting: *Deleted

## 2021-03-22 MED ORDER — ROSUVASTATIN CALCIUM 40 MG PO TABS: 40.0000 mg | ORAL_TABLET | Freq: Every day | ORAL | 0 refills | Status: DC

## 2021-03-30 ENCOUNTER — Ambulatory Visit: Payer: Managed Care, Other (non HMO) | Admitting: Cardiology

## 2021-05-04 ENCOUNTER — Encounter: Payer: Self-pay | Admitting: Nurse Practitioner

## 2021-05-04 ENCOUNTER — Other Ambulatory Visit: Payer: Self-pay

## 2021-05-04 ENCOUNTER — Ambulatory Visit (INDEPENDENT_AMBULATORY_CARE_PROVIDER_SITE_OTHER): Payer: Managed Care, Other (non HMO) | Admitting: Nurse Practitioner

## 2021-05-04 VITALS — BP 131/90 | HR 80 | Temp 98.0°F | Ht 70.0 in | Wt 231.2 lb

## 2021-05-04 DIAGNOSIS — J3089 Other allergic rhinitis: Secondary | ICD-10-CM

## 2021-05-04 DIAGNOSIS — Z6833 Body mass index (BMI) 33.0-33.9, adult: Secondary | ICD-10-CM

## 2021-05-04 DIAGNOSIS — I2510 Atherosclerotic heart disease of native coronary artery without angina pectoris: Secondary | ICD-10-CM

## 2021-05-04 DIAGNOSIS — E782 Mixed hyperlipidemia: Secondary | ICD-10-CM | POA: Diagnosis not present

## 2021-05-04 DIAGNOSIS — K219 Gastro-esophageal reflux disease without esophagitis: Secondary | ICD-10-CM | POA: Diagnosis not present

## 2021-05-04 DIAGNOSIS — I2583 Coronary atherosclerosis due to lipid rich plaque: Secondary | ICD-10-CM

## 2021-05-04 MED ORDER — OMEPRAZOLE 20 MG PO CPDR: 20.0000 mg | DELAYED_RELEASE_CAPSULE | Freq: Every day | ORAL | 1 refills | Status: AC

## 2021-05-04 MED ORDER — FLUTICASONE PROPIONATE 50 MCG/ACT NA SUSP: 2.0000 | Freq: Every day | NASAL | 6 refills | Status: AC

## 2021-05-04 MED ORDER — ROSUVASTATIN CALCIUM 20 MG PO TABS: 20.0000 mg | ORAL_TABLET | Freq: Every day | ORAL | 0 refills | Status: AC

## 2021-05-04 NOTE — Progress Notes (Signed)
Established Patient Office Visit  Subjective:  Patient ID: Danny Ayers, male    DOB: Jul 12, 1960  Age: 61 y.o. MRN: [id number]  CC:  Chief Complaint  Patient presents with   Follow-up    HPI Krish Bailly presents for hyperlipidemia. Has had negative side effects associated with taking statins. He did have to see cardiology due to CAD. Did heart catheterization. Has a 60% blockage on one coronary artery. This was not enough to stent. Had been restarted on pravastatin at 71m. He did have a headache for 2 days, but then this resolved and he tolerated this well. He id not have excellent results on this, his cardiologist switched his cholesterol medication to rosuvastatin 440mdaily. The patient almost immediately started having headaches. Stopped taking rosuvastatin. Has not taken this medication for several months.  Having trouble with allergies. Gets nasal congestion every evening. States that it is difficult for him to breath, so uch so that it is difficult for him to breathe.   Past Medical History:  Diagnosis Date   Arthritis of low back    Coronary artery disease 12/2020   50% proximal-mid LAD (iFR 0.92), 60-70% D1, and 20-35% proximal RCA dz.   GERD (gastroesophageal reflux disease)    High cholesterol    History of kidney stones    Hypertension    diagnosed years ago, lost weight, no longer on medications    Past Surgical History:  Procedure Laterality Date   BICEPS TENDON REPAIR Bilateral    CARDIAC CATHETERIZATION     CORONARY PRESSURE WIRE/FFR WITH 3D MAPPING N/A 12/14/2020   Procedure: Coronary Pressure Wire/FFR w/3D Mapping;  Surgeon: ArWellington HampshireMD;  [facility];  Service: Cardiovascular;  Laterality: N/A;   CYSTOSCOPY/URETEROSCOPY/HOLMIUM LASER/STENT PLACEMENT Right 02/06/2019   Procedure: CYSTOSCOPY; RIGHT URETEROSCOPY; RIGHT URETERAL STENT PLACEMENT; BALLOON DILATION OF URETERAL STRICTURE; BASKET STONE EXTRACTION;  Surgeon: WrIrine SealMD;   Location: [facility];  Service: Urology;  Laterality: Right;   LEFT HEART CATH AND CORONARY ANGIOGRAPHY Left 12/14/2020   Procedure: LEFT HEART CATH AND CORONARY ANGIOGRAPHY;  Surgeon: ArWellington HampshireMD;  [facility];  Service: Cardiovascular;  Laterality: Left;   LUMBAR LAMINECTOMY/DECOMPRESSION MICRODISCECTOMY Left 04/25/2019   Procedure: LEFT SIDED LUMBAR 4-5 MICRODISECTOMY;  Surgeon: DuPhylliss BobMD;  [facility];  Laterality: Left;   NECK SURGERY     TRANSFORAMINAL LUMBAR INTERBODY FUSION (TLIF) WITH PEDICLE SCREW FIXATION 1 LEVEL Left 08/21/2019   Procedure: LEFT-SIDED LUMBAR 4- LUMBAR 5 TRANSFORAMINAL LUMBAR INTERBODY FUSION WITH INSTRUMENTATION AND ALLOGRAFT;  Surgeon: DuPhylliss BobMD;  [facility];  Laterality: Left;   UPPER GASTROINTESTINAL ENDOSCOPY      Family History  Problem Relation Age of Onset   Prostate cancer Father    Coronary artery disease Father    Diabetes Brother    Prostate cancer Paternal Uncle    Diabetes Paternal Grandmother    Stroke Paternal Aunt     Social History   Socioeconomic History   Marital status: Married    Spouse name: Not on file   Number of children: Not on file   Years of education: Not on file   Highest education level: Not on file  Occupational History   Not on file  Tobacco Use   Smoking status: Never   Smokeless tobacco: Never  Vaping Use   Vaping Use: Never used  Substance and Sexual Activity   Alcohol use: Not Currently  Drug use: Never   Sexual activity: Yes    Partners: Female    Birth control/protection: None  Other Topics Concern   Not on file  Social History Narrative   Not on file   Social Determinants of Health   Financial Resource Strain: Not on file  Food Insecurity: Not on file  Transportation Needs: Not on file  Physical Activity: Not on file  Stress: Not on file  Social Connections: Not on file  Intimate Partner Violence:  Not on file    Outpatient Medications Prior to Visit  Medication Sig Dispense Refill   aspirin EC 81 MG tablet Take 1 tablet (81 mg total) by mouth daily. Swallow whole. 90 tablet 3   omeprazole (PRILOSEC) 20 MG capsule Take 1 capsule (20 mg total) by mouth daily. 90 capsule 1   Evolocumab (REPATHA SURECLICK) 619 MG/ML SOAJ Inject 140 mg into the skin every 14 (fourteen) days. (Patient not taking: No sig reported) 2 mL 11   HYDROcodone-acetaminophen (NORCO/VICODIN) 5-325 MG tablet Take 1 tablet by mouth every 6 (six) hours as needed. 10 tablet 0   nitroGLYCERIN (NITROSTAT) 0.4 MG SL tablet Place 1 tablet (0.4 mg total) under the tongue every 5 (five) minutes as needed for chest pain. For a maximum of 3 tablets in one day. 25 tablet 1   pravastatin (PRAVACHOL) 20 MG tablet Take 20 mg by mouth daily.     rosuvastatin (CRESTOR) 40 MG tablet Take 1 tablet (40 mg total) by mouth daily. 30 tablet 0   No facility-administered medications prior to visit.    Allergies  Allergen Reactions   Niacin And Related Other (See Comments)    Intense heat     ROS Review of Systems  Constitutional:  Negative for activity change, chills, fatigue and fever.  HENT:  Positive for congestion, postnasal drip, rhinorrhea and sneezing. Negative for sinus pressure, sinus pain and sore throat.   Eyes: Negative.   Respiratory:  Negative for cough, shortness of breath and wheezing.   Cardiovascular:  Negative for chest pain and palpitations.  Gastrointestinal:  Negative for constipation, diarrhea, nausea and vomiting.       GERD  Endocrine: Negative for cold intolerance, heat intolerance, polydipsia and polyuria.  Genitourinary:  Negative for dysuria, frequency and urgency.  Musculoskeletal:  Negative for back pain and myalgias.  Skin:  Negative for rash.  Allergic/Immunologic: Positive for environmental allergies.  Neurological:  Negative for dizziness, weakness and headaches.  Psychiatric/Behavioral:  The  patient is not nervous/anxious.      Objective:    Physical Exam Vitals and nursing note reviewed.  Constitutional:      Appearance: Normal appearance. He is well-developed.  HENT:     Head: Normocephalic and atraumatic.     Nose: Nose normal.     Mouth/Throat:     Mouth: Mucous membranes are moist.  Eyes:     Extraocular Movements: Extraocular movements intact.     Conjunctiva/sclera: Conjunctivae normal.     Pupils: Pupils are equal, round, and reactive to light.  Neck:     Vascular: No carotid bruit.  Cardiovascular:     Rate and Rhythm: Normal rate and regular rhythm.     Pulses: Normal pulses.     Heart sounds: Normal heart sounds.  Pulmonary:     Effort: Pulmonary effort is normal.     Breath sounds: Normal breath sounds.  Abdominal:     Palpations: Abdomen is soft.  Musculoskeletal:  General: Normal range of motion.     Cervical back: Normal range of motion and neck supple.  Lymphadenopathy:     Cervical: No cervical adenopathy.  Skin:    General: Skin is warm and dry.     Capillary Refill: Capillary refill takes less than 2 seconds.  Neurological:     General: No focal deficit present.     Mental Status: He is alert and oriented to person, place, and time.  Psychiatric:        Mood and Affect: Mood normal.        Behavior: Behavior normal.        Thought Content: Thought content normal.        Judgment: Judgment normal.    Today's Vitals   05/04/21 0813  BP: 131/90  Pulse: 80  Temp: 98 F (36.7 C)  SpO2: 92%  Weight: 231 lb 3.2 oz (104.9 kg)   Body mass index is 33.17 kg/m.   Wt Readings from Last 3 Encounters:  05/04/21 231 lb 3.2 oz (104.9 kg)  01/08/21 220 lb (99.8 kg)  12/23/20 233 lb (105.7 kg)     Health Maintenance Due  Topic Date Due   HIV Screening  Never done   Hepatitis C Screening  Never done   TETANUS/TDAP  Never done   COLONOSCOPY (Pts 45-69yr Insurance coverage will need to be confirmed)  Never done   Zoster  Vaccines- Shingrix (1 of 2) Never done   COVID-19 Vaccine (2 - Moderna series) 04/07/2020   INFLUENZA VACCINE  11/02/2020    There are no preventive care reminders to display for this patient.  Lab Results  Component Value Date   TSH 2.100 09/24/2020   Lab Results  Component Value Date   WBC 6.0 01/08/2021   HGB 16.1 01/08/2021   HCT 46.3 01/08/2021   MCV 91.0 01/08/2021   PLT 219 01/08/2021   Lab Results  Component Value Date   NA 139 01/08/2021   K 3.9 01/08/2021   CO2 22 01/08/2021   GLUCOSE 145 (H) 01/08/2021   BUN 12 01/08/2021   CREATININE 1.20 01/08/2021   BILITOT 0.5 09/24/2020   ALKPHOS 81 09/24/2020   AST 40 09/24/2020   ALT 62 (H) 09/24/2020   PROT 6.8 09/24/2020   ALBUMIN 4.6 09/24/2020   CALCIUM 9.2 01/08/2021   ANIONGAP 10 01/08/2021   EGFR 81 11/12/2020   Lab Results  Component Value Date   CHOL 261 (H) 09/24/2020   Lab Results  Component Value Date   HDL 30 (L) 09/24/2020   Lab Results  Component Value Date   LDLCALC 172 (H) 09/24/2020   Lab Results  Component Value Date   TRIG 306 (H) 09/24/2020   Lab Results  Component Value Date   CHOLHDL 8.7 (H) 09/24/2020   Lab Results  Component Value Date   HGBA1C 5.9 (H) 02/21/2018      Assessment & Plan:  1. Mixed hyperlipidemia Trial of rosuvastatin 277mdaily with supper. If patient experiences headache, will consider rosuvastatin at 1055maily. Recheck fasting lipid panel in 3 months and adjust dosing as indicated  - rosuvastatin (CRESTOR) 20 MG tablet; Take 1 tablet (20 mg total) by mouth daily.  Dispense: 90 tablet; Refill: 0  2. Coronary artery disease due to lipid rich plaque Restart daily statin dosing. May need to consider other statins to prevent negative side effects. Discuss further at next visit .  3. Body mass index (BMI) of 33.0-33.9 in adult Encourage patient  to limit calorie intake to 2000 cal/day or less.  He should consume a low cholesterol, low-fat diet.  Patient  should incorporate exercise into his daily routine.   4. Gastroesophageal reflux disease without esophagitis May continue omeprazole daily. New prescription sent to his pharmacy today.  - omeprazole (PRILOSEC) 20 MG capsule; Take 1 capsule (20 mg total) by mouth daily.  Dispense: 90 capsule; Refill: 1  5. Perennial allergic rhinitis Start flonase nasal spray. Use 1 to 2 sprays in both nostrils daily. Recommend use of OTC claritin.  - fluticasone (FLONASE) 50 MCG/ACT nasal spray; Place 2 sprays into both nostrils daily.  Dispense: 16 g; Refill: 6   Problem List Items Addressed This Visit       Cardiovascular and Mediastinum   Coronary artery disease   Relevant Medications   rosuvastatin (CRESTOR) 20 MG tablet     Digestive   Gastroesophageal reflux disease (Chronic)   Relevant Medications   omeprazole (PRILOSEC) 20 MG capsule     Other   Mixed hyperlipidemia - Primary (Chronic)   Relevant Medications   rosuvastatin (CRESTOR) 20 MG tablet   Other Visit Diagnoses     Body mass index (BMI) of 33.0-33.9 in adult       Perennial allergic rhinitis       Relevant Medications   fluticasone (FLONASE) 50 MCG/ACT nasal spray       Meds ordered this encounter  Medications   omeprazole (PRILOSEC) 20 MG capsule    Sig: Take 1 capsule (20 mg total) by mouth daily.    Dispense:  90 capsule    Refill:  1    Please fill as 90 day prescription and please do not run through patient's insurance. Thanks.    Order Specific Question:   Supervising Provider    Answer:   Beatrice Lecher D [[id number]]   fluticasone (FLONASE) 50 MCG/ACT nasal spray    Sig: Place 2 sprays into both nostrils daily.    Dispense:  16 g    Refill:  6    Order Specific Question:   Supervising Provider    Answer:   Beatrice Lecher D [[id number]]   rosuvastatin (CRESTOR) 20 MG tablet    Sig: Take 1 tablet (20 mg total) by mouth daily.    Dispense:  90 tablet    Refill:  0    Adding back dose rosuvastatin    Order  Specific Question:   Supervising Provider    Answer:   Beatrice Lecher D [[id number]]    Follow-up: Return in about 3 months (around 08/01/2021) for health maintenance exam, FBW a week prior to visit.    Ronnell Freshwater, NP

## 2021-05-04 NOTE — Patient Instructions (Signed)
Fat and Cholesterol Restricted Eating Plan Getting too much fat and cholesterol in your diet may cause health problems. Choosing the right foods helps keep your fat and cholesterol at normal levels. This can keep you from getting certain diseases. Your doctor may recommend an eating plan that includes: Total fat: ______% or less of total calories a day. This is ______g of fat a day. Saturated fat: ______% or less of total calories a day. This is ______g of saturated fat a day. Cholesterol: less than _________mg a day. Fiber: ______g a day. What are tips for following this plan? General tips Work with your doctor to lose weight if you need to. Avoid: Foods with added sugar. Fried foods. Foods with trans fat or partially hydrogenated oils. This includes some margarines and baked goods. If you drink alcohol: Limit how much you have to: 0-1 drink a day for women who are not pregnant. 0-2 drinks a day for men. Know how much alcohol is in a drink. In the [place] one drink equals one 12 oz bottle of beer (355 mL), one 5 oz glass of wine (148 mL), or one 1 oz glass of hard liquor (44 mL). Reading food labels Check food labels for: Trans fats. Partially hydrogenated oils. Saturated fat (g) in each serving. Cholesterol (mg) in each serving. Fiber (g) in each serving. Choose foods with healthy fats, such as: Monounsaturated fats and polyunsaturated fats. These include olive and canola oil, flaxseeds, walnuts, almonds, and seeds. Omega-3 fats. These are found in certain fish, flaxseed oil, and ground flaxseeds. Choose grain products that have whole grains. Look for the word "whole" as the first word in the ingredient list. Cooking Cook foods using low-fat methods. These include baking, boiling, grilling, and broiling. Eat more home-cooked foods. Eat at restaurants and buffets less often. Eat less fast food. Avoid cooking using saturated fats, such as butter, cream, palm oil, palm kernel oil, and  coconut oil. Meal planning  At meals, divide your plate into four equal parts: Fill one-half of your plate with vegetables, green salads, and fruit. Fill one-fourth of your plate with whole grains. Fill one-fourth of your plate with low-fat (lean) protein foods. Eat fish that is high in omega-3 fats at least two times a week. This includes mackerel, tuna, sardines, and salmon. Eat foods that are high in fiber, such as whole grains, beans, apples, pears, berries, broccoli, carrots, peas, and barley. What foods should I eat? Fruits All fresh, canned (in natural juice), or frozen fruits. Vegetables Fresh or frozen vegetables (raw, steamed, roasted, or grilled). Green salads. Grains Whole grains, such as whole wheat or whole grain breads, crackers, cereals, and pasta. Unsweetened oatmeal, bulgur, barley, quinoa, or brown rice. Corn or whole wheat flour tortillas. Meats and other protein foods Ground beef (85% or leaner), grass-fed beef, or beef trimmed of fat. Skinless chicken or turkey. Ground chicken or turkey. Pork trimmed of fat. All fish and seafood. Egg whites. Dried beans, peas, or lentils. Unsalted nuts or seeds. Unsalted canned beans. Nut butters without added sugar or oil. Dairy Low-fat or nonfat dairy products, such as skim or 1% milk, 2% or reduced-fat cheeses, low-fat and fat-free ricotta or cottage cheese, or plain low-fat and nonfat yogurt. Fats and oils Tub margarine without trans fats. Light or reduced-fat mayonnaise and salad dressings. Avocado. Olive, canola, sesame, or safflower oils. The items listed above may not be a complete list of foods and beverages you can eat. Contact a dietitian for more information. What foods   should I avoid? Fruits Canned fruit in heavy syrup. Fruit in cream or butter sauce. Fried fruit. Vegetables Vegetables cooked in cheese, cream, or butter sauce. Fried vegetables. Grains White bread. White pasta. White rice. Cornbread. Bagels, pastries,  and croissants. Crackers and snack foods that contain trans fat and hydrogenated oils. Meats and other protein foods Fatty cuts of meat. Ribs, chicken wings, bacon, sausage, bologna, salami, chitterlings, fatback, hot dogs, bratwurst, and packaged lunch meats. Liver and organ meats. Whole eggs and egg yolks. Chicken and turkey with skin. Fried meat. Dairy Whole or 2% milk, cream, half-and-half, and cream cheese. Whole milk cheeses. Whole-fat or sweetened yogurt. Full-fat cheeses. Nondairy creamers and whipped toppings. Processed cheese, cheese spreads, and cheese curds. Fats and oils Butter, stick margarine, lard, shortening, ghee, or bacon fat. Coconut, palm kernel, and palm oils. Beverages Alcohol. Sugar-sweetened drinks such as sodas, lemonade, and fruit drinks. Sweets and desserts Corn syrup, sugars, honey, and molasses. Candy. Jam and jelly. Syrup. Sweetened cereals. Cookies, pies, cakes, donuts, muffins, and ice cream. The items listed above may not be a complete list of foods and beverages you should avoid. Contact a dietitian for more information. Summary Choosing the right foods helps keep your fat and cholesterol at normal levels. This can keep you from getting certain diseases. At meals, fill one-half of your plate with vegetables, green salads, and fruits. Eat high fiber foods, like whole grains, beans, apples, pears, berries, carrots, peas, and barley. Limit added sugar, saturated fats, alcohol, and fried foods. This information is not intended to replace advice given to you by your health care provider. Make sure you discuss any questions you have with your health care provider. Document Revised: 07/31/2020 Document Reviewed: 07/31/2020 [employer] Patient Education  2022 [employer]  

## 2021-07-21 ENCOUNTER — Encounter: Payer: Managed Care, Other (non HMO) | Admitting: Nurse Practitioner

## 2021-09-02 ENCOUNTER — Other Ambulatory Visit: Payer: Self-pay | Admitting: Orthopedic Surgery

## 2021-09-02 DIAGNOSIS — M5440 Lumbago with sciatica, unspecified side: Secondary | ICD-10-CM

## 2021-09-16 ENCOUNTER — Ambulatory Visit
Admission: RE | Admit: 2021-09-16 | Discharge: 2021-09-16 | Disposition: A | Discharge status: Home / Self Care | Payer: Managed Care, Other (non HMO) | Source: Ambulatory Visit | Attending: Orthopedic Surgery | Admitting: Orthopedic Surgery

## 2021-09-16 DIAGNOSIS — M5440 Lumbago with sciatica, unspecified side: Secondary | ICD-10-CM

## 2021-11-15 ENCOUNTER — Telehealth: Payer: Self-pay

## 2021-11-15 NOTE — Telephone Encounter (Signed)
Per [email or web address], Repatha prior Berkley Harvey is expiring at the end of the month. Medication was no longer on med list. Called for clarification and spoke with patient who states he does not want to take this medication. Informed Coleen picard-Tagnolli, RN.

## 2022-06-29 ENCOUNTER — Other Ambulatory Visit: Payer: Self-pay | Admitting: Orthopedic Surgery

## 2022-06-29 DIAGNOSIS — M545 Low back pain, unspecified: Secondary | ICD-10-CM

## 2022-07-02 ENCOUNTER — Ambulatory Visit
Admission: RE | Admit: 2022-07-02 | Discharge: 2022-07-02 | Disposition: A | Discharge status: Home / Self Care | Payer: Managed Care, Other (non HMO) | Source: Ambulatory Visit | Attending: Orthopedic Surgery | Admitting: Orthopedic Surgery

## 2022-07-02 DIAGNOSIS — M545 Low back pain, unspecified: Secondary | ICD-10-CM

## 2023-01-06 ENCOUNTER — Other Ambulatory Visit: Payer: Self-pay | Admitting: Nurse Practitioner

## 2023-01-06 DIAGNOSIS — Z1212 Encounter for screening for malignant neoplasm of rectum: Secondary | ICD-10-CM

## 2023-01-06 DIAGNOSIS — Z1211 Encounter for screening for malignant neoplasm of colon: Secondary | ICD-10-CM
